# Patient Record
Sex: Female | Born: 1985
Health system: Southern US, Community
[De-identification: ages and names within clinical notes are randomized; demographics above are authoritative.]

## PROBLEM LIST (undated history)

## (undated) ENCOUNTER — Ambulatory Visit (HOSPITAL_COMMUNITY): Discharge status: Absent without leave | Payer: BLUE CROSS/BLUE SHIELD

## (undated) DIAGNOSIS — F32A Depression, unspecified: Secondary | ICD-10-CM

## (undated) DIAGNOSIS — R87629 Unspecified abnormal cytological findings in specimens from vagina: Secondary | ICD-10-CM

## (undated) DIAGNOSIS — F419 Anxiety disorder, unspecified: Secondary | ICD-10-CM

## (undated) DIAGNOSIS — F329 Major depressive disorder, single episode, unspecified: Secondary | ICD-10-CM

## (undated) DIAGNOSIS — N200 Calculus of kidney: Secondary | ICD-10-CM

## (undated) DIAGNOSIS — A609 Anogenital herpesviral infection, unspecified: Secondary | ICD-10-CM

## (undated) DIAGNOSIS — E162 Hypoglycemia, unspecified: Secondary | ICD-10-CM

## (undated) HISTORY — PX: URETHRA SURGERY: SHX824

## (undated) HISTORY — DX: Major depressive disorder, single episode, unspecified: F32.9

## (undated) HISTORY — PX: WISDOM TOOTH EXTRACTION: SHX21

## (undated) HISTORY — DX: Depression, unspecified: F32.A

## (undated) HISTORY — DX: Anogenital herpesviral infection, unspecified: A60.9

---
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
## 2006-04-03 ENCOUNTER — Emergency Department (HOSPITAL_COMMUNITY): Admission: EM | Admit: 2006-04-03 | Discharge: 2006-04-04 | Payer: Self-pay | Admitting: Emergency Medicine

## 2008-02-18 ENCOUNTER — Ambulatory Visit (HOSPITAL_COMMUNITY): Admission: RE | Admit: 2008-02-18 | Discharge: 2008-02-18 | Payer: Self-pay | Admitting: Family Medicine

## 2008-02-22 ENCOUNTER — Encounter (HOSPITAL_COMMUNITY): Admission: RE | Admit: 2008-02-22 | Discharge: 2008-03-23 | Payer: Self-pay | Admitting: Family Medicine

## 2008-03-28 ENCOUNTER — Emergency Department (HOSPITAL_COMMUNITY): Admission: EM | Admit: 2008-03-28 | Discharge: 2008-03-28 | Payer: Self-pay | Admitting: Emergency Medicine

## 2008-08-23 ENCOUNTER — Encounter: Payer: Self-pay | Admitting: Orthopedic Surgery

## 2008-08-23 ENCOUNTER — Emergency Department (HOSPITAL_COMMUNITY): Admission: EM | Admit: 2008-08-23 | Discharge: 2008-08-23 | Payer: Self-pay | Admitting: Emergency Medicine

## 2008-08-30 ENCOUNTER — Ambulatory Visit (HOSPITAL_COMMUNITY): Admission: RE | Admit: 2008-08-30 | Discharge: 2008-08-30 | Payer: Self-pay | Admitting: Orthopedic Surgery

## 2008-08-30 ENCOUNTER — Ambulatory Visit: Payer: Self-pay | Admitting: Orthopedic Surgery

## 2008-08-30 ENCOUNTER — Telehealth: Payer: Self-pay | Admitting: Orthopedic Surgery

## 2008-08-30 ENCOUNTER — Encounter (INDEPENDENT_AMBULATORY_CARE_PROVIDER_SITE_OTHER): Payer: Self-pay | Admitting: *Deleted

## 2008-08-30 DIAGNOSIS — S139XXA Sprain of joints and ligaments of unspecified parts of neck, initial encounter: Secondary | ICD-10-CM | POA: Insufficient documentation

## 2008-09-18 ENCOUNTER — Ambulatory Visit: Payer: Self-pay | Admitting: Orthopedic Surgery

## 2008-09-18 DIAGNOSIS — M5020 Other cervical disc displacement, unspecified cervical region: Secondary | ICD-10-CM | POA: Insufficient documentation

## 2008-09-21 ENCOUNTER — Telehealth: Payer: Self-pay | Admitting: Orthopedic Surgery

## 2008-10-23 ENCOUNTER — Ambulatory Visit: Payer: Self-pay | Admitting: Orthopedic Surgery

## 2008-10-24 ENCOUNTER — Encounter (HOSPITAL_COMMUNITY): Admission: RE | Admit: 2008-10-24 | Discharge: 2008-11-02 | Payer: Self-pay | Admitting: Orthopedic Surgery

## 2008-10-24 ENCOUNTER — Encounter: Payer: Self-pay | Admitting: Orthopedic Surgery

## 2008-10-26 ENCOUNTER — Encounter: Payer: Self-pay | Admitting: Orthopedic Surgery

## 2008-11-03 ENCOUNTER — Encounter (HOSPITAL_COMMUNITY): Admission: RE | Admit: 2008-11-03 | Discharge: 2008-12-03 | Payer: Self-pay | Admitting: Orthopedic Surgery

## 2008-11-10 ENCOUNTER — Emergency Department (HOSPITAL_COMMUNITY): Admission: EM | Admit: 2008-11-10 | Discharge: 2008-11-10 | Payer: Self-pay | Admitting: Emergency Medicine

## 2008-12-18 ENCOUNTER — Ambulatory Visit: Payer: Self-pay | Admitting: Orthopedic Surgery

## 2009-02-07 ENCOUNTER — Telehealth: Payer: Self-pay | Admitting: Orthopedic Surgery

## 2009-03-22 ENCOUNTER — Ambulatory Visit: Payer: Self-pay | Admitting: Orthopedic Surgery

## 2009-03-29 ENCOUNTER — Telehealth: Payer: Self-pay | Admitting: Orthopedic Surgery

## 2009-04-02 ENCOUNTER — Telehealth: Payer: Self-pay | Admitting: Orthopedic Surgery

## 2009-04-19 ENCOUNTER — Encounter: Payer: Self-pay | Admitting: Orthopedic Surgery

## 2009-05-14 ENCOUNTER — Telehealth: Payer: Self-pay | Admitting: Orthopedic Surgery

## 2009-05-17 ENCOUNTER — Telehealth: Payer: Self-pay | Admitting: Orthopedic Surgery

## 2009-05-24 ENCOUNTER — Encounter: Payer: Self-pay | Admitting: Orthopedic Surgery

## 2009-08-01 ENCOUNTER — Encounter: Payer: Self-pay | Admitting: Orthopedic Surgery

## 2009-09-27 ENCOUNTER — Other Ambulatory Visit: Admission: RE | Admit: 2009-09-27 | Discharge: 2009-09-27 | Payer: Self-pay | Admitting: Gynecology

## 2009-09-27 ENCOUNTER — Ambulatory Visit: Payer: Self-pay | Admitting: Gynecology

## 2009-10-31 ENCOUNTER — Emergency Department (HOSPITAL_COMMUNITY): Admission: EM | Admit: 2009-10-31 | Discharge: 2009-10-31 | Payer: Self-pay | Admitting: Emergency Medicine

## 2009-11-10 ENCOUNTER — Emergency Department (HOSPITAL_COMMUNITY): Admission: EM | Admit: 2009-11-10 | Discharge: 2009-11-10 | Payer: Self-pay | Admitting: Emergency Medicine

## 2010-01-23 ENCOUNTER — Inpatient Hospital Stay (HOSPITAL_COMMUNITY)
Admission: AD | Admit: 2010-01-23 | Discharge: 2010-01-23 | Payer: Self-pay | Source: Home / Self Care | Attending: Obstetrics & Gynecology | Admitting: Obstetrics & Gynecology

## 2010-01-31 ENCOUNTER — Emergency Department (HOSPITAL_COMMUNITY)
Admission: EM | Admit: 2010-01-31 | Discharge: 2010-01-31 | Payer: Self-pay | Source: Home / Self Care | Admitting: Emergency Medicine

## 2010-02-23 ENCOUNTER — Encounter: Payer: Self-pay | Admitting: Orthopaedic Surgery

## 2010-03-05 NOTE — Letter (Signed)
Summary: Request for medical records [facility]  Request for medical records [facility]   Imported By: Cammie Sickle 05/17/2009 11:21:48  _____________________________________________________________________  External Attachment:    Type:   Image     Comment:   External Document

## 2010-03-05 NOTE — Progress Notes (Signed)
Summary: wants refills on meds  Phone Note Call from Patient   Summary of Call: Catherine Mitchell ([birth year]) is requesting refills on Robaxin, Ibuprofen and Loratab.  I told her according to the last note she is to get the [facility] before anymore refills.  Said she cannot get the injections until this summer, due to classes.    Asked if she needed to schedule an appointment in order to get the refills.  Uses [facility] in [place] Her # [phone] Initial call taken by: Jacklynn Ganong,  May 17, 2009 10:52 AM  Follow-up for Phone Call        refill ibuprofen and robaxin only  Follow-up by: Fuller Canada MD,  May 17, 2009 10:54 AM    Prescriptions: ROBAXIN 500 MG TABS (METHOCARBAMOL) 1 by mouth q 6 as needed pain  #28 x 5   Entered by:   Ether Griffins   Authorized by:   Fuller Canada MD   Signed by:   Ether Griffins on 05/17/2009   Method used:   Faxed to ...       [facility]  [address] # [phone]* (retail)       [address]       [place], [place]  [address]       Ph: [phone] or [phone]       Fax: [phone]   RxID:   [id number] IBUPROFEN 800 MG TABS (IBUPROFEN) 1 by mouth q 8 as needed pain  #90 x 5   Entered by:   Ether Griffins   Authorized by:   Fuller Canada MD   Signed by:   Ether Griffins on 05/17/2009   Method used:   Faxed to ...       [facility]  [address] # [phone]* (retail)       [address]       [place], [place]  [address]       Ph: [phone] or [phone]       Fax: [phone]   RxID:   [id number]

## 2010-03-05 NOTE — Miscellaneous (Signed)
Summary: PT Discharge summary  PT Discharge summary   Imported By: Jacklynn Ganong 08/02/2009 09:33:24  _____________________________________________________________________  External Attachment:    Type:   Image     Comment:   External Document

## 2010-03-05 NOTE — Progress Notes (Signed)
Summary: requesting refills on pain med, has not been compliant  Phone Note Call from Patient Call back at [facility] Phone [phone] Call back at [phone]   Summary of Call: says she needs refills on her medications her neck is really stiff and has pain, needs Norco 5, Relafen, Ibuprofen and Robaxin she has not went to PT due to the costs. Scheduled to come back next month any suggestions Initial call taken by: Ether Griffins,  February 07, 2009 12:07 PM  Follow-up for Phone Call        okay to refill ibuprofen and Robaxinand ibuprofen no Norco Follow-up by: Fuller Canada MD,  February 07, 2009 12:17 PM  Additional Follow-up for Phone Call Additional follow up Details #1::        advised pt and faxed [facility] [place] Additional Follow-up by: Ether Griffins,  February 07, 2009 1:27 PM    Prescriptions: ROBAXIN 500 MG TABS (METHOCARBAMOL) 1 by mouth q 6 as needed pain  #28 x 2   Entered by:   Ether Griffins   Authorized by:   Fuller Canada MD   Signed by:   Ether Griffins on 02/07/2009   Method used:   Faxed to ...       [facility]  [address] # [phone]* (retail)       [address]       [place], [place]  [address]       Ph: [phone] or [phone]       Fax: [phone]   RxID:   [id number] IBUPROFEN 800 MG TABS (IBUPROFEN) 1 by mouth q 8 as needed pain  #90 x 1   Entered by:   Ether Griffins   Authorized by:   Fuller Canada MD   Signed by:   Ether Griffins on 02/07/2009   Method used:   Faxed to ...       [facility]  [address] # [phone]* (retail)       [address]       [place], [place]  [address]       Ph: [phone] or [phone]       Fax: [phone]   RxID:   [id number]

## 2010-03-05 NOTE — Progress Notes (Signed)
Summary: call from patient about referral  Phone Note Call from Patient   Caller: Patient Summary of Call: O 03/27/09 patient had called about referral for [facility]; states she has re-applied for [facility] discount through financial services.    -advised we would fol/up to check status before proceeding with referral. Initial call taken by: Cammie Sickle,  March 29, 2009 5:47 PM  Follow-up for Phone Call        As of 03/30/09, discount not showing in system Follow-up by: Cammie Sickle,  March 29, 2009 5:47 PM  Additional Follow-up for Phone Call Additional follow up Details #1::        Today, patient and mother each called to relay that patient wishes to wait on ESI until end of spring semester, 06/12/09. Asking for refill on last pain medication; said is out. Additional Follow-up by: Cammie Sickle,  April 02, 2009 10:12 AM

## 2010-03-05 NOTE — Progress Notes (Signed)
Summary: wants vicodin refill  Phone Note Call from Patient Call back at Home Phone [phone]   Summary of Call: wants refill on Vicodin 5 ok or not, is not going for [facility] neck pain Initial call taken by: Ether Griffins,  May 14, 2009 8:47 AM  Follow-up for Phone Call        not Follow-up by: Fuller Canada MD,  May 14, 2009 8:50 AM  Additional Follow-up for Phone Call Additional follow up Details #1::        ok Additional Follow-up by: Ether Griffins,  May 14, 2009 9:01 AM

## 2010-03-05 NOTE — Progress Notes (Signed)
Summary: patient requests PT order fax to [facility]  Phone Note Call from Patient   Caller: Patient Summary of Call: Patient called back with a separate question.  Requests that we fax her physical therapy order from November visit to: [facility]; states she has been in touch with financial assistance dept in [facility].   ?New order needed, dates on order are 11/15 through 06/17/09. Initial call taken by: Cammie Sickle,  May 17, 2009 12:04 PM  Follow-up for Phone Call        ok faxed order to [facility] Follow-up by: Ether Griffins,  May 17, 2009 1:36 PM

## 2010-03-05 NOTE — Miscellaneous (Signed)
Summary: PTreports 10/24/08 and 11/01/08  PTreports 10/24/08 and 11/01/08   Imported By: Cammie Sickle 03/22/2009 12:56:02  _____________________________________________________________________  External Attachment:    Type:   Image     Comment:   External Document
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
## 2010-03-05 NOTE — Progress Notes (Signed)
Summary: Request to hold on [facility] referral + request refills  Phone Note Call from Patient   Caller: Patient Summary of Call: patient called this morning, and also mother got onto phone, to relay that she wishes to hold on the referral for the [facility] until the end of the spring semester 06/12/09.  states she is out of her pain medication and requests refills.  said "had been given 5 refills last time." please advise. Initial call taken by: Cammie Sickle,  April 02, 2009 3:18 PM  Follow-up for Phone Call        Patient called and lft voice message that she is coming in today after class to ask about refill; states "neck is killing her" and that she has "got to have something."  States it was not her idea to have the ESI/injections anyway.  Follow-up by: Cammie Sickle,  April 02, 2009 3:19 PM    Prescriptions: IBUPROFEN 800 MG TABS (IBUPROFEN) 1 by mouth q 8 as needed pain  #90 x 5   Entered and Authorized by:   Fuller Canada MD   Signed by:   Fuller Canada MD on 04/02/2009   Method used:   Print then Give to Patient   RxID:   [id number] ROBAXIN 500 MG TABS (METHOCARBAMOL) 1 by mouth q 6 as needed pain  #28 x 5   Entered and Authorized by:   Fuller Canada MD   Signed by:   Fuller Canada MD on 04/02/2009   Method used:   Print then Give to Patient   RxID:   [id number] LORTAB 5 5-500 MG TABS (HYDROCODONE-ACETAMINOPHEN) 1 q 4 as needed pain  #42 x 5   Entered and Authorized by:   Fuller Canada MD   Signed by:   Fuller Canada MD on 04/02/2009   Method used:   Print then Give to Patient   RxID:   [id number]

## 2010-03-05 NOTE — Miscellaneous (Signed)
Summary: PT evaluation  PT evaluation   Imported By: Jacklynn Ganong 06/04/2009 16:25:34  _____________________________________________________________________  External Attachment:    Type:   Image     Comment:   External Document

## 2010-04-15 LAB — URINALYSIS, ROUTINE W REFLEX MICROSCOPIC
Bilirubin Urine: NEGATIVE
Glucose, UA: NEGATIVE mg/dL
Ketones, ur: NEGATIVE mg/dL
Leukocytes, UA: NEGATIVE
Nitrite: NEGATIVE
Protein, ur: NEGATIVE mg/dL
Specific Gravity, Urine: >1.03 — ABNORMAL HIGH (ref 1.005–1.030)
Urobilinogen, UA: 0.2 mg/dL (ref 0.0–1.0)
pH: 5.5 (ref 5.0–8.0)

## 2010-04-15 LAB — DIFFERENTIAL
Basophils Absolute: 0.1 10*3/uL (ref 0.0–0.1)
Basophils Relative: 1 % (ref 0–1)
Eosinophils Absolute: 0.7 10*3/uL (ref 0.0–0.7)
Eosinophils Relative: 9 % — ABNORMAL HIGH (ref 0–5)
Lymphs Abs: 2.7 10*3/uL (ref 0.7–4.0)
Neutrophils Relative %: 50 % (ref 43–77)

## 2010-04-15 LAB — WET PREP, GENITAL
Trich, Wet Prep: NONE SEEN
Yeast Wet Prep HPF POC: NONE SEEN

## 2010-04-15 LAB — CBC
MCH: 28.7 pg (ref 26.0–34.0)
MCHC: 32.9 g/dL (ref 30.0–36.0)
MCV: 87.5 fL (ref 78.0–100.0)
Platelets: 315 10*3/uL (ref 150–400)
RDW: 13.5 % (ref 11.5–15.5)

## 2010-04-15 LAB — GC/CHLAMYDIA PROBE AMP, GENITAL
Chlamydia, DNA Probe: NEGATIVE
GC Probe Amp, Genital: NEGATIVE

## 2010-04-15 LAB — URINE MICROSCOPIC-ADD ON

## 2010-04-15 LAB — HCG, QUANTITATIVE, PREGNANCY: hCG, Beta Chain, Quant, S: <2 m[IU]/mL (ref <5)

## 2010-04-15 LAB — POCT PREGNANCY, URINE: Preg Test, Ur: NEGATIVE

## 2010-05-06 ENCOUNTER — Emergency Department (HOSPITAL_COMMUNITY): Payer: Self-pay

## 2010-05-06 ENCOUNTER — Emergency Department (HOSPITAL_COMMUNITY)
Admission: EM | Admit: 2010-05-06 | Discharge: 2010-05-06 | Disposition: A | Discharge status: Home / Self Care | Payer: Self-pay | Attending: Emergency Medicine | Admitting: Emergency Medicine

## 2010-05-06 DIAGNOSIS — R51 Headache: Secondary | ICD-10-CM | POA: Insufficient documentation

## 2010-05-06 DIAGNOSIS — M542 Cervicalgia: Secondary | ICD-10-CM | POA: Insufficient documentation

## 2010-05-06 DIAGNOSIS — R071 Chest pain on breathing: Secondary | ICD-10-CM | POA: Insufficient documentation

## 2010-05-06 DIAGNOSIS — R079 Chest pain, unspecified: Secondary | ICD-10-CM | POA: Insufficient documentation

## 2010-05-06 DIAGNOSIS — M25539 Pain in unspecified wrist: Secondary | ICD-10-CM | POA: Insufficient documentation

## 2010-05-06 DIAGNOSIS — R55 Syncope and collapse: Secondary | ICD-10-CM | POA: Insufficient documentation

## 2010-05-06 DIAGNOSIS — R110 Nausea: Secondary | ICD-10-CM | POA: Insufficient documentation

## 2010-05-06 DIAGNOSIS — J320 Chronic maxillary sinusitis: Secondary | ICD-10-CM | POA: Insufficient documentation

## 2010-05-06 DIAGNOSIS — S0510XA Contusion of eyeball and orbital tissues, unspecified eye, initial encounter: Secondary | ICD-10-CM | POA: Insufficient documentation

## 2010-05-06 DIAGNOSIS — S0003XA Contusion of scalp, initial encounter: Secondary | ICD-10-CM | POA: Insufficient documentation

## 2010-05-06 DIAGNOSIS — S0990XA Unspecified injury of head, initial encounter: Secondary | ICD-10-CM | POA: Insufficient documentation

## 2010-05-06 DIAGNOSIS — R220 Localized swelling, mass and lump, head: Secondary | ICD-10-CM | POA: Insufficient documentation

## 2010-10-12 ENCOUNTER — Encounter (HOSPITAL_COMMUNITY): Payer: Self-pay

## 2010-10-12 ENCOUNTER — Emergency Department (HOSPITAL_COMMUNITY)
Admission: EM | Admit: 2010-10-12 | Discharge: 2010-10-12 | Disposition: A | Discharge status: Home / Self Care | Payer: Self-pay | Attending: Emergency Medicine | Admitting: Emergency Medicine

## 2010-10-12 DIAGNOSIS — R1084 Generalized abdominal pain: Secondary | ICD-10-CM | POA: Insufficient documentation

## 2010-10-12 DIAGNOSIS — N760 Acute vaginitis: Secondary | ICD-10-CM | POA: Insufficient documentation

## 2010-10-12 DIAGNOSIS — A499 Bacterial infection, unspecified: Secondary | ICD-10-CM | POA: Insufficient documentation

## 2010-10-12 DIAGNOSIS — R300 Dysuria: Secondary | ICD-10-CM | POA: Insufficient documentation

## 2010-10-12 DIAGNOSIS — B9689 Other specified bacterial agents as the cause of diseases classified elsewhere: Secondary | ICD-10-CM | POA: Insufficient documentation

## 2010-10-12 LAB — URINALYSIS, ROUTINE W REFLEX MICROSCOPIC
Bilirubin Urine: NEGATIVE
Glucose, UA: NEGATIVE mg/dL
Hgb urine dipstick: NEGATIVE
Protein, ur: NEGATIVE mg/dL

## 2010-10-12 LAB — WET PREP, GENITAL: Trich, Wet Prep: NONE SEEN

## 2010-10-12 LAB — PREGNANCY, URINE: Preg Test, Ur: NEGATIVE

## 2010-10-12 MED ORDER — OXYCODONE-ACETAMINOPHEN 5-325 MG PO TABS
1.0000 | ORAL_TABLET | Freq: Once | ORAL | Status: AC
  Administered 2010-10-12: 1 via ORAL
  Filled 2010-10-12: qty 1

## 2010-10-12 MED ORDER — OXYCODONE-ACETAMINOPHEN 5-325 MG PO TABS: 1.0000 | ORAL_TABLET | ORAL | Status: AC | PRN

## 2010-10-12 MED ORDER — ONDANSETRON 8 MG PO TBDP
8.0000 mg | ORAL_TABLET | Freq: Once | ORAL | Status: AC
  Administered 2010-10-12: 8 mg via ORAL
  Filled 2010-10-12: qty 1

## 2010-10-12 MED ORDER — METRONIDAZOLE 500 MG PO TABS: 500.0000 mg | ORAL_TABLET | Freq: Two times a day (BID) | ORAL | Status: AC

## 2010-10-12 MED ORDER — CEPHALEXIN 500 MG PO CAPS: 500.0000 mg | ORAL_CAPSULE | Freq: Four times a day (QID) | ORAL | Status: AC

## 2010-10-12 NOTE — ED Notes (Signed)
Pt reports lower left abd pain that radiates around to her left flank area.  Pt reports being treated to a UTI for the past 3 weeks.  Pt was dx with the UTI at an urgent care.  Pt also reports dysuria, and urinary frequency.  Pt states " i feel like something is sitting on my bladder all the time".

## 2010-10-12 NOTE — ED Provider Notes (Signed)
History     CSN: [id number] Arrival date & time: 10/12/2010  5:00 PM Pt seen at 1755 Chief Complaint  Patient presents with  . Abdominal Pain  . Flank Pain  . Dysuria   Patient is a 25 y.o. female presenting with abdominal pain.  Abdominal Pain The primary symptoms of the illness include dysuria, vaginal discharge and vaginal bleeding. The primary symptoms of the illness do not include fever. Episode onset: 3 months ago. The onset of the illness was gradual. The problem has not changed since onset. The dysuria is associated with frequency.  The vaginal discharge is associated with dysuria.  Additional symptoms associated with the illness include frequency and back pain.  pt reports for 3 months she has dysuria, abd pain and back pain Reports being placed on cipro twice and macrobid once.  Not currently on abx. Reports she had bladder "stretched" but no issues since then  History reviewed. No pertinent past medical history.  History reviewed. No pertinent past surgical history.  No family history on file.  History  Substance Use Topics  . Smoking status: Never Smoker   . Smokeless tobacco: Not on file  . Alcohol Use: Yes    OB History    Grav Para Term Preterm Abortions TAB SAB Ect Mult Living                  Review of Systems  Constitutional: Negative for fever.  Genitourinary: Positive for dysuria, frequency, vaginal bleeding and vaginal discharge.  Musculoskeletal: Positive for back pain.    Physical Exam  BP 128/75  Pulse 84  Temp(Src) 98.2 F (36.8 C) (Oral)  Resp 18  Ht 5\' 8"  (1.727 m)  Wt 215 lb (97.523 kg)  BMI 32.69 kg/m2  SpO2 100%  LMP 09/12/2010  Physical Exam  CONSTITUTIONAL: Well developed/well nourished HEAD AND FACE: Normocephalic/atraumatic EYES: EOMI/PERRL ENMT: Mucous membranes moist NECK: supple no meningeal signs CV: S1/S2 noted, no murmurs/rubs/gallops noted LUNGS: Lungs are clear to auscultation bilaterally, no apparent  distress ABDOMEN: soft, nontender, no rebound or guarding GU:no cva tenderness No cmt, no adnexal tenderness/mass, +vag discharge, no vag bleeding, chaperone present  NEURO: Pt is awake/alert, moves all extremitiesx4 EXTREMITIES: pulses normal, full ROM SKIN: warm, color normal PSYCH: no abnormalities of mood noted   ED Course  Procedures  MDM Nursing notes reviewed and considered in documentation All labs/vitals reviewed and considered  8:32 PM Advised need for f/u with urology as outpatient after another round of outpatient abx Pt agreeable She is well appearing and feels improved     Joya Gaskins, MD 10/12/10 2032

## 2010-10-14 LAB — GC/CHLAMYDIA PROBE AMP, GENITAL: GC Probe Amp, Genital: NEGATIVE

## 2010-10-15 LAB — URINE CULTURE

## 2011-01-10 ENCOUNTER — Encounter (HOSPITAL_COMMUNITY): Payer: Self-pay | Admitting: *Deleted

## 2011-01-10 ENCOUNTER — Emergency Department (HOSPITAL_COMMUNITY)
Admission: EM | Admit: 2011-01-10 | Discharge: 2011-01-10 | Disposition: A | Discharge status: Home / Self Care | Payer: Self-pay | Attending: Emergency Medicine | Admitting: Emergency Medicine

## 2011-01-10 DIAGNOSIS — J111 Influenza due to unidentified influenza virus with other respiratory manifestations: Secondary | ICD-10-CM | POA: Insufficient documentation

## 2011-01-10 HISTORY — DX: Hypoglycemia, unspecified: E16.2

## 2011-01-10 MED ORDER — LIDOCAINE VISCOUS 2 % MT SOLN
20.0000 mL | Freq: Once | OROMUCOSAL | Status: AC
  Administered 2011-01-10: 20 mL via OROMUCOSAL
  Filled 2011-01-10: qty 15

## 2011-01-10 MED ORDER — LIDOCAINE VISCOUS 2 % MT SOLN: 20.0000 mL | OROMUCOSAL | Status: AC | PRN

## 2011-01-10 NOTE — ED Notes (Signed)
Pt c/o sore throat, bilateral ear pain, body aches and fever x 4 days

## 2011-01-11 NOTE — ED Provider Notes (Signed)
History     CSN: [id number] Arrival date & time: 01/10/2011  9:21 AM   First MD Initiated Contact with Patient 01/10/11 [phone]      Chief Complaint  Patient presents with  . Sore Throat  . Otalgia  . Fever  . Generalized Body Aches    (Consider location/radiation/quality/duration/timing/severity/associated sxs/prior treatment) Patient is a 25 y.o. female presenting with pharyngitis, ear pain, and fever. The history is provided by the patient.  Sore Throat This is a new problem. The current episode started in the past 7 days. The problem occurs constantly. The problem has been unchanged. Associated symptoms include chills, a fever, myalgias and a sore throat. Pertinent negatives include no abdominal pain, arthralgias, chest pain, congestion, coughing, headaches, joint swelling, nausea, neck pain, numbness, rash or weakness. Associated symptoms comments: Bilateral ear pain.   Fever has been subjective.. The symptoms are aggravated by swallowing. She has tried NSAIDs and acetaminophen for the symptoms. The treatment provided no relief.  Otalgia This is a new problem. The current episode started more than 2 days ago. There is pain in both ears. The problem occurs constantly. The problem has not changed since onset.The pain is at a severity of 8/10. The pain is moderate. Associated symptoms include sore throat. Pertinent negatives include no ear discharge, no headaches, no hearing loss, no rhinorrhea, no abdominal pain, no neck pain, no cough and no rash. Associated symptoms comments: No nasal congestion.. Her past medical history does not include chronic ear infection.  Fever Primary symptoms of the febrile illness include fever and myalgias. Primary symptoms do not include headaches, cough, shortness of breath, abdominal pain, nausea, arthralgias or rash.  The myalgias are not associated with weakness.    Past Medical History  Diagnosis Date  . Hypoglycemia     Past Surgical History    Procedure Date  . Urethra surgery had it stretched as a child    History reviewed. No pertinent family history.  History  Substance Use Topics  . Smoking status: Never Smoker   . Smokeless tobacco: Not on file  . Alcohol Use: Yes    OB History    Grav Para Term Preterm Abortions TAB SAB Ect Mult Living                  Review of Systems  Constitutional: Positive for fever and chills.  HENT: Positive for ear pain and sore throat. Negative for hearing loss, congestion, rhinorrhea, neck pain and ear discharge.   Eyes: Negative.   Respiratory: Negative for cough, chest tightness and shortness of breath.   Cardiovascular: Negative for chest pain.  Gastrointestinal: Negative for nausea and abdominal pain.  Genitourinary: Negative.   Musculoskeletal: Positive for myalgias. Negative for joint swelling and arthralgias.  Skin: Negative.  Negative for rash and wound.  Neurological: Negative for dizziness, weakness, light-headedness, numbness and headaches.  Hematological: Negative.   Psychiatric/Behavioral: Negative.     Allergies  Latex; Sulfur; and Tomato  Home Medications   Current Outpatient Rx  Name Route Sig Dispense Refill  . ALPRAZOLAM 1 MG PO TABS Oral Take 1 mg by mouth 2 (two) times daily.     Marland Kitchen ZYRTEC ALLERGY PO Oral Take 1 tablet by mouth daily.     . IBUPROFEN 200 MG PO TABS Oral Take 400 mg by mouth every 8 (eight) hours as needed. ACHES AND PAIN     . LIDOCAINE VISCOUS 2 % MT SOLN Oral Take 20 mLs by mouth as needed for pain.  Gargle and spit solution for pain control 100 mL 0    BP 109/61  Pulse 60  Temp(Src) 98.3 F (36.8 C) (Oral)  Resp 18  Ht 5\' 8"  (1.727 m)  Wt 215 lb (97.523 kg)  BMI 32.69 kg/m2  SpO2 96%  LMP 12/11/2010  Physical Exam  Nursing note and vitals reviewed. Constitutional: She is oriented to person, place, and time. She appears well-developed and well-nourished.  HENT:  Head: Normocephalic and atraumatic.  Right Ear: Hearing,  tympanic membrane, external ear and ear canal normal.  Left Ear: Hearing, tympanic membrane, external ear and ear canal normal.  Nose: No mucosal edema or rhinorrhea.  Mouth/Throat: Uvula is midline and mucous membranes are normal. Posterior oropharyngeal erythema present. No oropharyngeal exudate, posterior oropharyngeal edema or tonsillar abscesses.  Eyes: Conjunctivae are normal.  Neck: Normal range of motion.  Cardiovascular: Normal rate, regular rhythm, normal heart sounds and intact distal pulses.   Pulmonary/Chest: Effort normal and breath sounds normal. She has no wheezes.  Abdominal: Soft. Bowel sounds are normal. There is no tenderness.  Musculoskeletal: Normal range of motion.  Neurological: She is alert and oriented to person, place, and time.  Skin: Skin is warm and dry.  Psychiatric: She has a normal mood and affect.    ED Course  Procedures (including critical care time)   Labs Reviewed  RAPID STREP SCREEN  LAB REPORT - SCANNED   No results found.   1. Influenza       MDM  Rest,  Fluids,  Tylenol or motrin,  Lidocaine gargles for throat pain relief.        Candis Musa, PA 01/11/11 2201

## 2011-01-13 NOTE — ED Provider Notes (Signed)
Medical screening examination/treatment/procedure(s) were performed by non-physician practitioner and as supervising physician I was immediately available for consultation/collaboration.  Donnetta Hutching, MD 01/13/11 [phone]

## 2011-05-01 ENCOUNTER — Encounter (HOSPITAL_COMMUNITY): Payer: Self-pay | Admitting: *Deleted

## 2011-05-01 ENCOUNTER — Emergency Department (HOSPITAL_COMMUNITY)
Admission: EM | Admit: 2011-05-01 | Discharge: 2011-05-01 | Disposition: A | Discharge status: Home / Self Care | Payer: Self-pay | Attending: Emergency Medicine | Admitting: Emergency Medicine

## 2011-05-01 ENCOUNTER — Emergency Department (HOSPITAL_COMMUNITY): Payer: Self-pay

## 2011-05-01 DIAGNOSIS — R6883 Chills (without fever): Secondary | ICD-10-CM | POA: Insufficient documentation

## 2011-05-01 DIAGNOSIS — R079 Chest pain, unspecified: Secondary | ICD-10-CM | POA: Insufficient documentation

## 2011-05-01 DIAGNOSIS — R0789 Other chest pain: Secondary | ICD-10-CM

## 2011-05-01 DIAGNOSIS — J40 Bronchitis, not specified as acute or chronic: Secondary | ICD-10-CM | POA: Insufficient documentation

## 2011-05-01 DIAGNOSIS — R071 Chest pain on breathing: Secondary | ICD-10-CM | POA: Insufficient documentation

## 2011-05-01 MED ORDER — HYDROCODONE-ACETAMINOPHEN 5-325 MG PO TABS: 1.0000 | ORAL_TABLET | ORAL | Status: AC | PRN

## 2011-05-01 MED ORDER — AZITHROMYCIN 250 MG PO TABS
500.0000 mg | ORAL_TABLET | Freq: Once | ORAL | Status: AC
  Administered 2011-05-01: 500 mg via ORAL
  Filled 2011-05-01: qty 2

## 2011-05-01 MED ORDER — ALBUTEROL SULFATE HFA 108 (90 BASE) MCG/ACT IN AERS
2.0000 | INHALATION_SPRAY | RESPIRATORY_TRACT | Status: DC
  Administered 2011-05-01: 2 via RESPIRATORY_TRACT
  Filled 2011-05-01: qty 6.7

## 2011-05-01 MED ORDER — HYDROCODONE-ACETAMINOPHEN 5-325 MG PO TABS
1.0000 | ORAL_TABLET | Freq: Once | ORAL | Status: AC
  Administered 2011-05-01: 1 via ORAL
  Filled 2011-05-01: qty 1

## 2011-05-01 MED ORDER — AZITHROMYCIN 250 MG PO TABS: 250.0000 mg | ORAL_TABLET | Freq: Every day | ORAL | Status: AC

## 2011-05-01 NOTE — Discharge Instructions (Signed)
Bronchitis Bronchitis is a problem of the air tubes leading to your lungs. This problem makes it hard for air to get in and out of the lungs. You may cough a lot because your air tubes are narrow. Going without care can cause lasting (chronic) bronchitis. HOME CARE   Drink enough fluids to keep your pee (urine) clear or pale yellow.   Use a cool mist humidifier.   Quit smoking if you smoke. If you keep smoking, the bronchitis might not get better.   Only take medicine as told by your doctor.  GET HELP RIGHT AWAY IF:   Coughing keeps you awake.   You start to wheeze.   You become more sick or weak.   You have a hard time breathing or get short of breath.   You cough up blood.   Coughing lasts more than 2 weeks.   You have a fever.   Your baby is older than 3 months with a rectal temperature of 102 F (38.9 C) or higher.   Your baby is 67 months old or younger with a rectal temperature of 100.4 F (38 C) or higher.  MAKE SURE YOU:  Understand these instructions.   Will watch your condition.   Will get help right away if you are not doing well or get worse.  Document Released: 07/09/2007 Document Revised: 01/09/2011 Document Reviewed: 12/22/2008 [facility] Patient Information 2012 [place], [place].Chest Wall Pain Chest wall pain is pain in or around the bones and muscles of your chest. It may take up to 6 weeks to get better. It may take longer if you must stay physically active in your work and activities.  CAUSES  Chest wall pain may happen on its own. However, it may be caused by:  A viral illness like the flu.   Injury.   Coughing.   Exercise.   Arthritis.   Fibromyalgia.   Shingles.  HOME CARE INSTRUCTIONS   Avoid overtiring physical activity. Try not to strain or perform activities that cause pain. This includes any activities using your chest or your abdominal and side muscles, especially if heavy weights are used.   Put ice on the sore area.   Put ice  in a plastic bag.   Place a towel between your skin and the bag.   Leave the ice on for 15 to 20 minutes per hour while awake for the first 2 days.   Only take over-the-counter or prescription medicines for pain, discomfort, or fever as directed by your caregiver.  SEEK IMMEDIATE MEDICAL CARE IF:   Your pain increases, or you are very uncomfortable.   You have a fever.   Your chest pain becomes worse.   You have new, unexplained symptoms.   You have nausea or vomiting.   You feel sweaty or lightheaded.   You have a cough with phlegm (sputum), or you cough up blood.  MAKE SURE YOU:   Understand these instructions.   Will watch your condition.   Will get help right away if you are not doing well or get worse.  Document Released: 01/20/2005 Document Revised: 01/09/2011 Document Reviewed: 09/16/2010 [facility] Patient Information 2012 [place], [place].   You may use the albuterol and albuterol inhaler given 2 puffs every 4 hours if needed for  coughing or wheezing. Take your next dose of Zithromax tomorrow. You may use the Vicodin which will help you with pain but also will help suppress your coughing.  I also suggest you continue taking ibuprofen as  you are doing, 800 mg 3 times daily for the next week which should help heal your chest wall pain.  Rest, make sure you're drinking plenty of fluids.  Get rechecked by your Dr. if this is not improving over the next 4-5 days.

## 2011-05-01 NOTE — ED Notes (Signed)
Pt DC to home with steady gait 

## 2011-05-01 NOTE — ED Notes (Signed)
Sick for 3 weeks, cough ,congestion,  Pain rt lt ribs since last night,  Green sputum. No fever.

## 2011-05-02 NOTE — ED Provider Notes (Signed)
History     CSN: [id number]  Arrival date & time 05/01/11  1143   First MD Initiated Contact with Patient 05/01/11 1236      Chief Complaint  Patient presents with  . Pleurisy    (Consider location/radiation/quality/duration/timing/severity/associated sxs/prior treatment) Patient is a 26 y.o. female presenting with URI. The history is provided by the patient.  URI The primary symptoms include cough. Primary symptoms do not include fever, headaches, sore throat, wheezing, abdominal pain, nausea, arthralgias or rash. Episode onset: 3 weeks ago. The problem has been gradually worsening (since yesterday has developed purulent sputum and pain in bilateral anterior ribs with coughing).  The cough began more than 1 week ago. The cough is productive. The sputum is green.  Symptoms associated with the illness include chills, congestion and rhinorrhea. The illness is not associated with plugged ear sensation, facial pain or sinus pressure. The following treatments were addressed: Acetaminophen was not tried. A decongestant was not tried. Aspirin was not tried. NSAIDs were effective.    Past Medical History  Diagnosis Date  . Hypoglycemia     Past Surgical History  Procedure Date  . Urethra surgery had it stretched as a child    History reviewed. No pertinent family history.  History  Substance Use Topics  . Smoking status: Never Smoker   . Smokeless tobacco: Not on file  . Alcohol Use: Yes    OB History    Grav Para Term Preterm Abortions TAB SAB Ect Mult Living                  Review of Systems  Constitutional: Positive for chills. Negative for fever.  HENT: Positive for congestion and rhinorrhea. Negative for sore throat, neck pain and sinus pressure.   Eyes: Negative.   Respiratory: Positive for cough. Negative for chest tightness, shortness of breath and wheezing.   Cardiovascular: Positive for chest pain. Negative for palpitations and leg swelling.  Gastrointestinal:  Negative for nausea and abdominal pain.  Genitourinary: Negative.   Musculoskeletal: Negative for joint swelling and arthralgias.  Skin: Negative.  Negative for rash and wound.  Neurological: Negative for dizziness, weakness, light-headedness, numbness and headaches.  Hematological: Negative.   Psychiatric/Behavioral: Negative.     Allergies  Latex; Sulfur; and Tomato  Home Medications   Current Outpatient Rx  Name Route Sig Dispense Refill  . ALPRAZOLAM 1 MG PO TABS Oral Take 1 mg by mouth 2 (two) times daily.     . AZITHROMYCIN 250 MG PO TABS Oral Take 1 tablet (250 mg total) by mouth daily. 4 tablet 0  . ZYRTEC ALLERGY PO Oral Take 1 tablet by mouth daily.     Marland Kitchen HYDROCODONE-ACETAMINOPHEN 5-325 MG PO TABS Oral Take 1 tablet by mouth every 4 (four) hours as needed for pain. 15 tablet 0  . IBUPROFEN 200 MG PO TABS Oral Take 400 mg by mouth every 8 (eight) hours as needed. ACHES AND PAIN       BP 119/67  Pulse 73  Temp(Src) 97.8 F (36.6 C) (Oral)  Resp 20  Ht 5\' 8"  (1.727 m)  Wt 215 lb (97.523 kg)  BMI 32.69 kg/m2  SpO2 100%  LMP 04/27/2011  Physical Exam  Nursing note and vitals reviewed. Constitutional: She is oriented to person, place, and time. She appears well-developed and well-nourished.  HENT:  Head: Normocephalic and atraumatic.  Eyes: Conjunctivae are normal.  Neck: Normal range of motion.  Cardiovascular: Normal rate, regular rhythm, normal heart sounds and intact  distal pulses.   Pulmonary/Chest: Effort normal and breath sounds normal. No respiratory distress. She has no wheezes. She has no rhonchi. She has no rales. She exhibits tenderness. She exhibits no crepitus, no deformity, no swelling and no retraction.         Frequent dry cough  Abdominal: Soft. Bowel sounds are normal. There is no tenderness.  Musculoskeletal: Normal range of motion.  Neurological: She is alert and oriented to person, place, and time.  Skin: Skin is warm and dry.  Psychiatric:  She has a normal mood and affect.    ED Course  Procedures (including critical care time)  Labs Reviewed - No data to display Dg Chest 2 View  05/01/2011  *RADIOLOGY REPORT*  Clinical Data: Pleurisy.  CHEST - 2 VIEW  Comparison: 05/06/2010  Findings: Heart and mediastinal contours are within normal limits. No focal opacities or effusions.  No acute bony abnormality.  IMPRESSION: Normal study.  Original Report Authenticated By: Cyndie Chime, M.D.     1. Bronchitis   2. Chest wall pain       MDM  Zithromax prescribed given chronicity of infection,  Hydrocodone for cough relief and chest wall pain tx.  Patient perc negative for PE,  Normal vital signs,  Chest wall pain reproducible.   Chest xray reviewed.         Candis Musa, PA 05/02/11 2158

## 2011-05-07 NOTE — ED Provider Notes (Signed)
Medical screening examination/treatment/procedure(s) were performed by non-physician practitioner and as supervising physician I was immediately available for consultation/collaboration.   Rashonda Warrior W Keitra Carusone, MD 05/07/11 1426 

## 2011-09-12 ENCOUNTER — Emergency Department (HOSPITAL_COMMUNITY)
Admission: EM | Admit: 2011-09-12 | Discharge: 2011-09-13 | Discharge status: Against Medical Advice | Payer: Self-pay | Attending: Emergency Medicine | Admitting: Emergency Medicine

## 2011-09-12 ENCOUNTER — Encounter (HOSPITAL_COMMUNITY): Payer: Self-pay | Admitting: *Deleted

## 2011-09-12 DIAGNOSIS — IMO0002 Reserved for concepts with insufficient information to code with codable children: Secondary | ICD-10-CM | POA: Insufficient documentation
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
## 2011-09-13 ENCOUNTER — Emergency Department (HOSPITAL_COMMUNITY): Payer: Self-pay
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
## 2011-09-13 NOTE — ED Notes (Addendum)
Patient is refusing x-rays and any further investigation by officer. States "I have to work at 6 in the morning and I just want to go home." Father at bedside. Advised Dr Manus Gunning.

## 2012-03-03 ENCOUNTER — Encounter (HOSPITAL_COMMUNITY): Payer: Self-pay | Admitting: Emergency Medicine

## 2012-03-03 ENCOUNTER — Emergency Department (HOSPITAL_COMMUNITY)
Admission: EM | Admit: 2012-03-03 | Discharge: 2012-03-03 | Disposition: A | Discharge status: Home / Self Care | Payer: Self-pay | Attending: Emergency Medicine | Admitting: Emergency Medicine

## 2012-03-03 ENCOUNTER — Emergency Department (HOSPITAL_COMMUNITY): Payer: Self-pay

## 2012-03-03 DIAGNOSIS — Y9301 Activity, walking, marching and hiking: Secondary | ICD-10-CM | POA: Insufficient documentation

## 2012-03-03 DIAGNOSIS — Z862 Personal history of diseases of the blood and blood-forming organs and certain disorders involving the immune mechanism: Secondary | ICD-10-CM | POA: Insufficient documentation

## 2012-03-03 DIAGNOSIS — Z8639 Personal history of other endocrine, nutritional and metabolic disease: Secondary | ICD-10-CM | POA: Insufficient documentation

## 2012-03-03 DIAGNOSIS — W010XXA Fall on same level from slipping, tripping and stumbling without subsequent striking against object, initial encounter: Secondary | ICD-10-CM | POA: Insufficient documentation

## 2012-03-03 DIAGNOSIS — Y92009 Unspecified place in unspecified non-institutional (private) residence as the place of occurrence of the external cause: Secondary | ICD-10-CM | POA: Insufficient documentation

## 2012-03-03 DIAGNOSIS — S40019A Contusion of unspecified shoulder, initial encounter: Secondary | ICD-10-CM | POA: Insufficient documentation

## 2012-03-03 MED ORDER — HYDROCODONE-ACETAMINOPHEN 5-325 MG PO TABS
1.0000 | ORAL_TABLET | Freq: Once | ORAL | Status: AC
  Administered 2012-03-03: 1 via ORAL
  Filled 2012-03-03: qty 1

## 2012-03-03 MED ORDER — HYDROCODONE-ACETAMINOPHEN 5-325 MG PO TABS: 1.0000 | ORAL_TABLET | ORAL | Status: DC | PRN

## 2012-03-03 MED ORDER — IBUPROFEN 600 MG PO TABS: 600.0000 mg | ORAL_TABLET | Freq: Four times a day (QID) | ORAL | Status: DC | PRN

## 2012-03-03 MED ORDER — IBUPROFEN 400 MG PO TABS
600.0000 mg | ORAL_TABLET | Freq: Once | ORAL | Status: AC
  Administered 2012-03-03: 600 mg via ORAL
  Filled 2012-03-03: qty 2

## 2012-03-03 NOTE — ED Notes (Signed)
Pt slipped and fell this am. C/o L shoulder pain radiating into elbow. Nad. rom slightly limited due to pain. No obvious dislocation noted.

## 2012-03-03 NOTE — ED Notes (Signed)
Patient with no complaints at this time. Respirations even and unlabored. Skin warm/dry. Discharge instructions reviewed with patient at this time. Patient given opportunity to voice concerns/ask questions. Patient discharged at this time and left Emergency Department with steady gait.   

## 2012-03-03 NOTE — ED Provider Notes (Signed)
History  This chart was scribed for [name], MD by Bennett Scrape, ED Scribe. This patient was seen in room [facility]16A/[facility]A and the patient's care was started at 12:48 PM.  CSN: [id number]  Arrival date & time 03/03/12  1223   First MD Initiated Contact with Patient 03/03/12 1248      Chief Complaint  Patient presents with  . Fall    Patient is a 27 y.o. female presenting with fall. The history is provided by the patient. No language interpreter was used.  Fall The accident occurred less than 1 hour ago. The fall occurred while walking. She fell from an unknown height. She landed on grass. There was no blood loss. The point of impact was the left shoulder. The pain is present in the left shoulder. The pain is moderate. She was ambulatory at the scene. There was no entrapment after the fall. There was no drug use involved in the accident. There was no alcohol use involved in the accident. Pertinent negatives include no abdominal pain, no nausea, no vomiting, no headaches and no loss of consciousness. The symptoms are aggravated by use of the injured limb.    Catherine Mitchell is a 27 y.o. female who presents to the Emergency Department complaining of a slip and fall on ice approximately one hour PTA. Pt states that she landed on her left shoulder on the sidewalk of her home. She c/o left shoulder pain and reports decreased ROM secondary to pain. She denies head trauma or LOC. She denies taking OTC medications at home to improve symptoms and denies prior injuries to the left shoulder. She denies back pain, abdominal pain and neck pain as associated symptoms. She has a h/o hypoglycemia and is an occasional alcohol user but denies smoking.  Past Medical History  Diagnosis Date  . Hypoglycemia     Past Surgical History  Procedure Date  . Urethra surgery had it stretched as a child    History reviewed. No pertinent family history.  History  Substance Use Topics  . Smoking status:  Never Smoker   . Smokeless tobacco: Not on file  . Alcohol Use: Yes     Comment: occasional    No OB history provided.  Review of Systems  HENT: Negative for neck pain.   Gastrointestinal: Negative for nausea, vomiting and abdominal pain.  Musculoskeletal: Positive for arthralgias (Positive for left shoulder pain). Negative for back pain.  Neurological: Negative for loss of consciousness and headaches.  All other systems reviewed and are negative.    Allergies  Latex; Sulfur; and Tomato  Home Medications   Current Outpatient Rx  Name  Route  Sig  Dispense  Refill  . ETONOGESTREL 68 MG [place] IMPL   Subcutaneous   Inject 1 each into the skin once.         . IBUPROFEN 200 MG PO TABS   Oral   Take 400 mg by mouth every 8 (eight) hours as needed. ACHES AND PAIN          . HYDROCODONE-ACETAMINOPHEN 5-325 MG PO TABS   Oral   Take 1 tablet by mouth every 4 (four) hours as needed for pain.   10 tablet   0   . IBUPROFEN 600 MG PO TABS   Oral   Take 1 tablet (600 mg total) by mouth every 6 (six) hours as needed for pain.   30 tablet   0     Triage Vitals: BP 129/78  Pulse 78  Temp 97.7 F (36.5 C) (Oral)  Resp 20  Ht 5\' 8"  (1.727 m)  Wt 220 lb (99.791 kg)  BMI 33.45 kg/m2  SpO2 100%  Physical Exam  Nursing note and vitals reviewed. Constitutional: She is oriented to person, place, and time. She appears well-developed and well-nourished. No distress.  HENT:  Head: Normocephalic and atraumatic.       No obvious sign of trauma  Eyes: EOM are normal.  Neck: Neck supple. No tracheal deviation present.       No c-spine tenderness  Cardiovascular: Normal rate.   Pulmonary/Chest: Effort normal. No respiratory distress.  Abdominal: Soft. There is no tenderness.  Musculoskeletal: Normal range of motion. She exhibits tenderness.       Tenderness over the left scapula spine, no proximal humerus tenderness  Neurological: She is alert and oriented to person, place, and  time.  Skin: Skin is warm and dry.  Psychiatric: She has a normal mood and affect. Her behavior is normal.    ED Course  Procedures (including critical care time)  DIAGNOSTIC STUDIES: Oxygen Saturation is 100% on room air, normal by my interpretation.    COORDINATION OF CARE: 1:12 PM-Discussed treatment plan which includes XR of left shoulder with pt at bedside and pt agreed to plan.    Labs Reviewed - No data to display Dg Shoulder Left  03/03/2012  *RADIOLOGY REPORT*  Clinical Data: Fall today left shoulder and humerus pain.  LEFT SHOULDER - 2+ VIEW  Comparison: None.  Findings: Calcific tendinosis is evident at the insertion of the rotator cuff tendon.  This may be post-traumatic.  No acute fracture is present.  The shoulder is located. The visualized hemithorax is clear.  IMPRESSION:  1.  Calcifications along the distal rotator cuff tendon are as compatible with calcific tendinosis, potentially from prior injury. 2.  No acute abnormality.   Original Report Authenticated By: Marin Roberts, M.D.      1. Shoulder contusion       MDM  I personally performed the services described in this documentation, which was scribed in my presence. The recorded information has been reviewed and is accurate.        [name], MD 03/04/12 [phone]

## 2012-03-28 IMAGING — CR DG CHEST 2V
2 series · 2 of 2 positions shown · non-contrast
Comparison: 05/06/2010

CLINICAL DATA: Pleurisy.

CHEST - 2 VIEW

[view not recorded (1 of 2)]
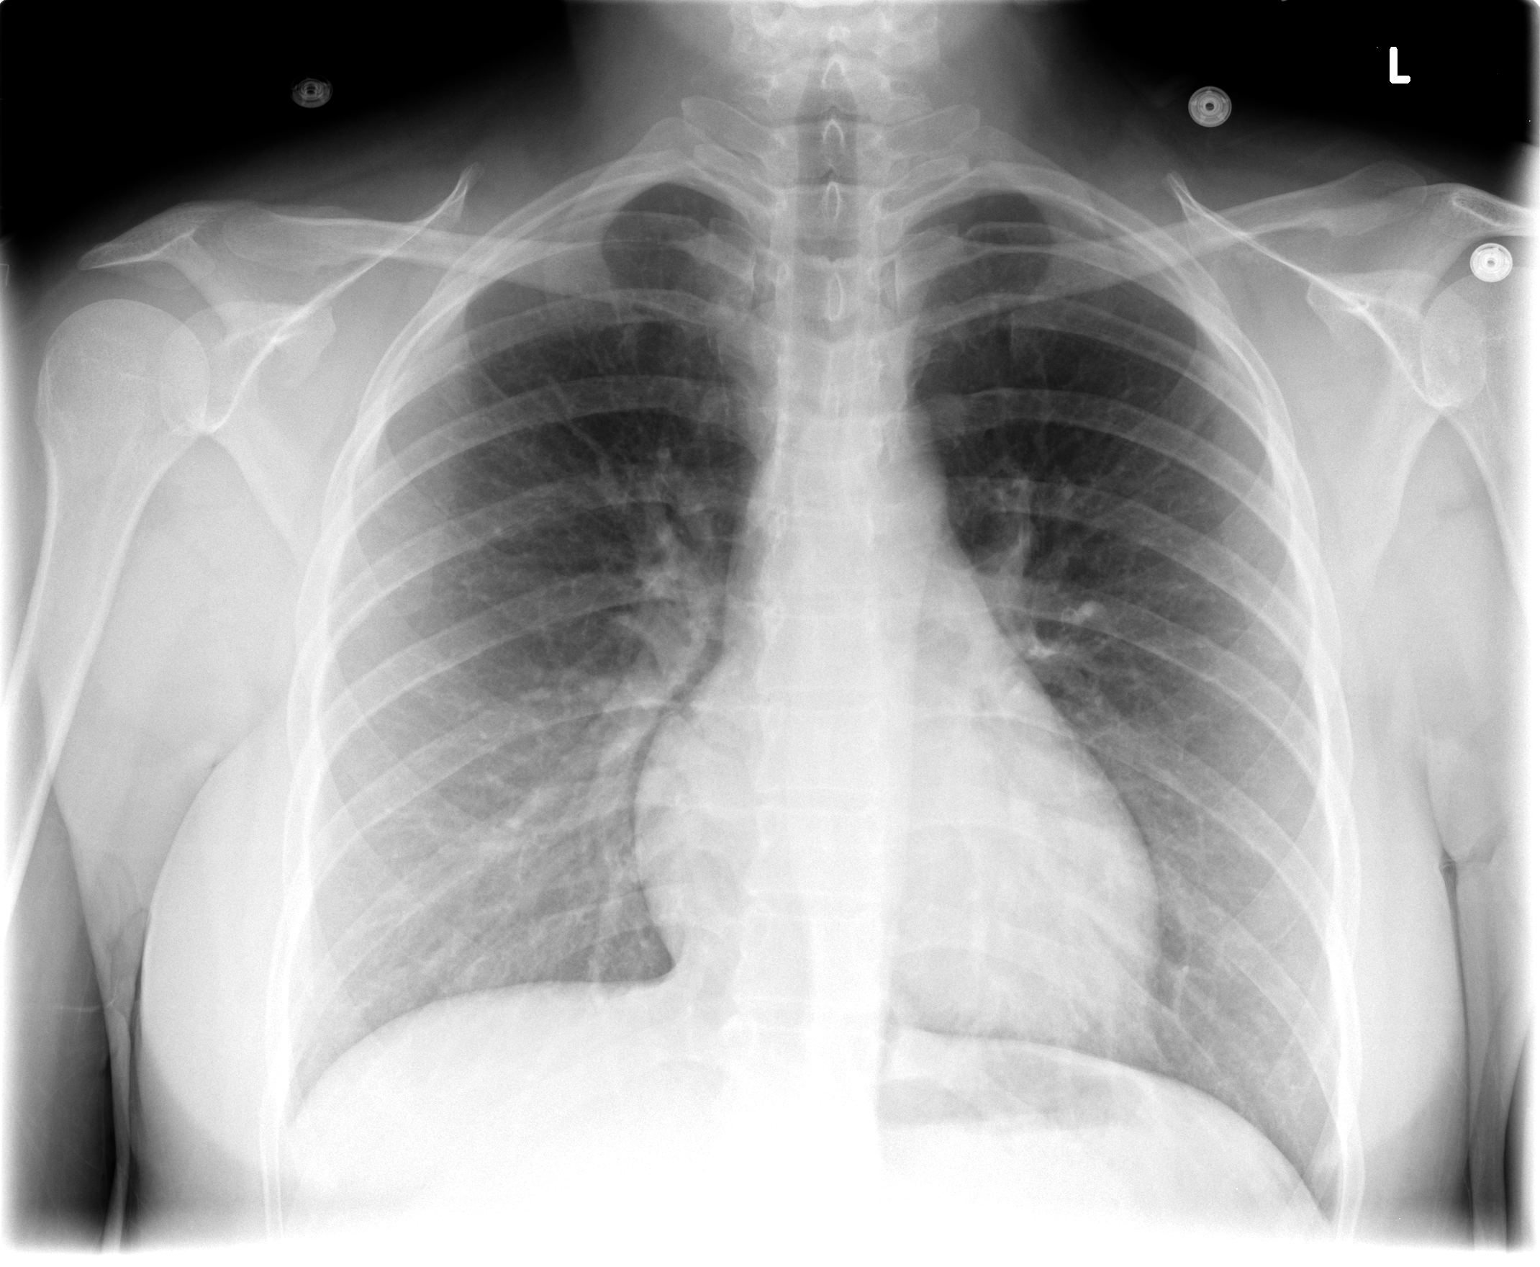

[view not recorded (2 of 2)]
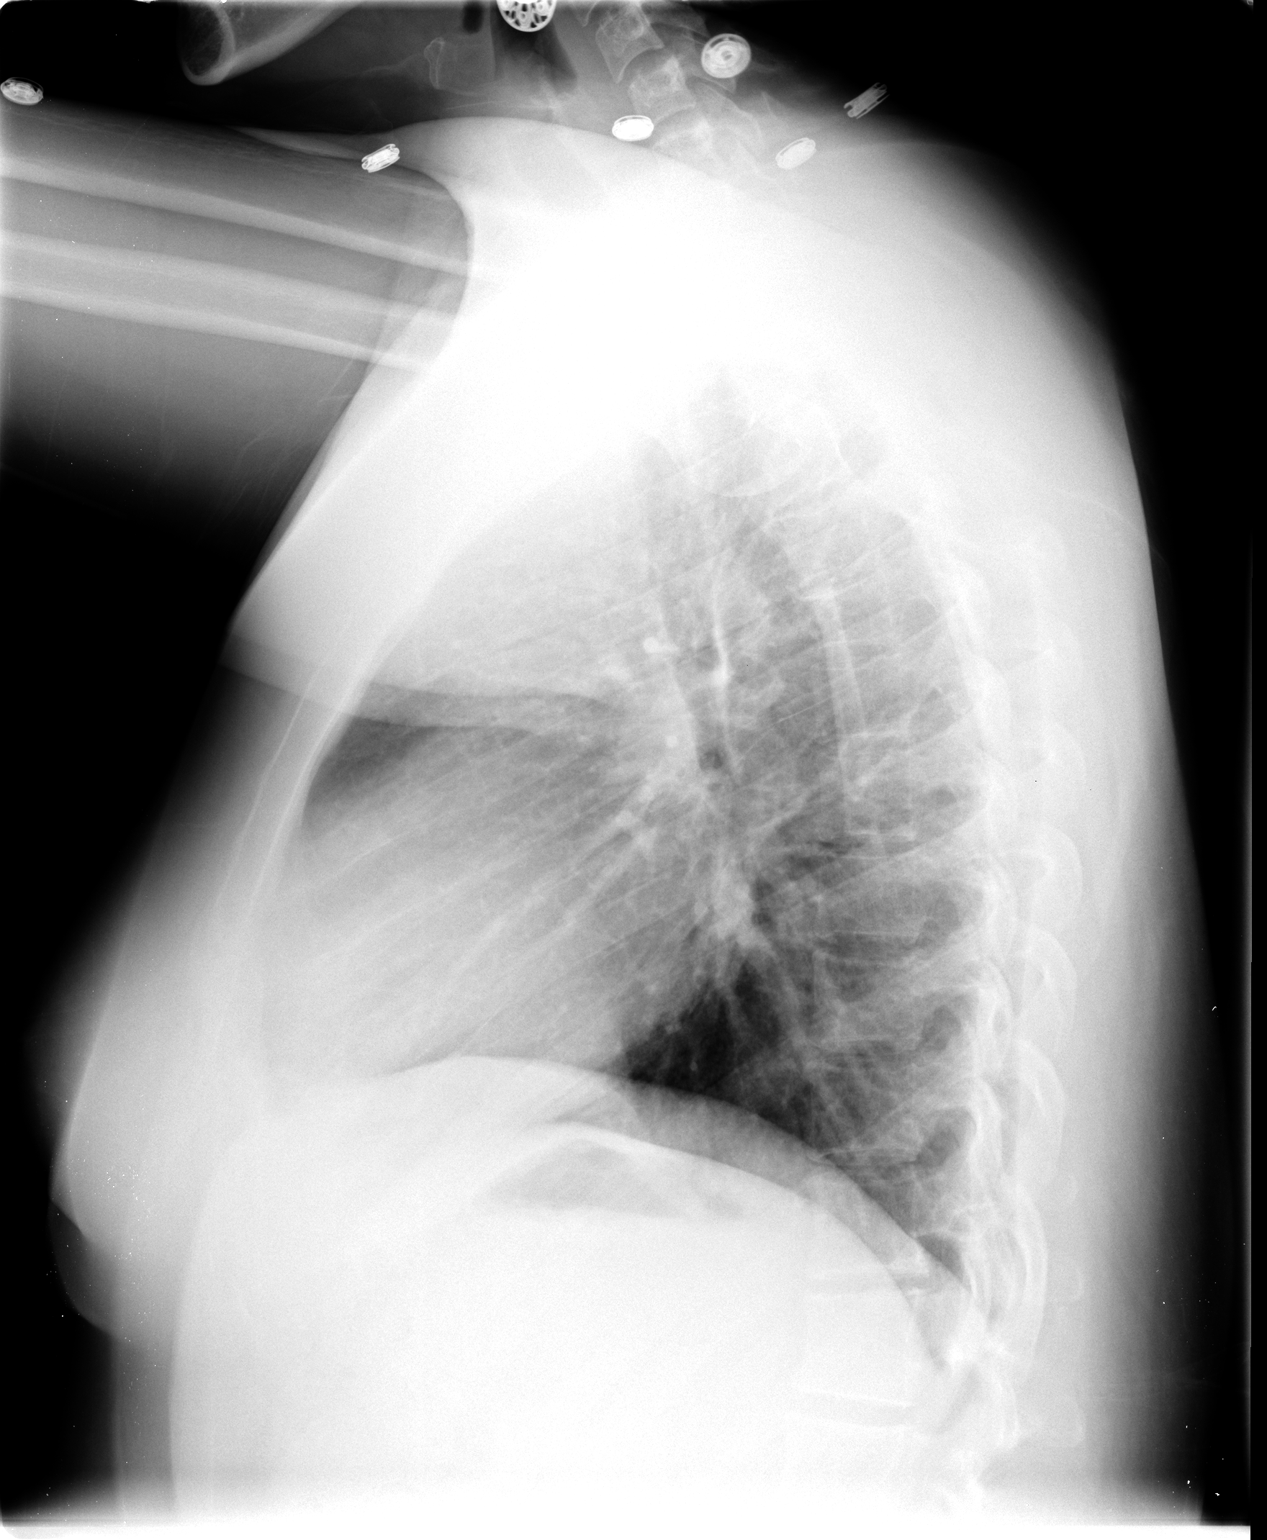

[2 of 2 positions shown; findings below may reference images not displayed]

FINDINGS: Heart and mediastinal contours are within normal limits.
No focal opacities or effusions.  No acute bony abnormality.
IMPRESSION: Normal study.

## 2013-02-03 HISTORY — PX: OTHER SURGICAL HISTORY: SHX169

## 2013-02-15 ENCOUNTER — Encounter: Payer: Self-pay | Admitting: Women's Health

## 2013-02-15 ENCOUNTER — Ambulatory Visit (INDEPENDENT_AMBULATORY_CARE_PROVIDER_SITE_OTHER): Payer: BC Managed Care – PPO | Admitting: Women's Health

## 2013-02-15 DIAGNOSIS — R300 Dysuria: Secondary | ICD-10-CM

## 2013-02-15 DIAGNOSIS — L293 Anogenital pruritus, unspecified: Secondary | ICD-10-CM

## 2013-02-15 DIAGNOSIS — N898 Other specified noninflammatory disorders of vagina: Secondary | ICD-10-CM

## 2013-02-15 DIAGNOSIS — N390 Urinary tract infection, site not specified: Secondary | ICD-10-CM

## 2013-02-15 LAB — URINALYSIS W MICROSCOPIC + REFLEX CULTURE
BILIRUBIN URINE: NEGATIVE
CASTS: NONE SEEN
CRYSTALS: NONE SEEN
Glucose, UA: NEGATIVE mg/dL
KETONES UR: NEGATIVE mg/dL
Nitrite: NEGATIVE
PH: 5.5 (ref 5.0–8.0)
Protein, ur: NEGATIVE mg/dL
SPECIFIC GRAVITY, URINE: 1.025 (ref 1.005–1.030)
Urobilinogen, UA: 0.2 mg/dL (ref 0.0–1.0)

## 2013-02-15 LAB — WET PREP FOR TRICH, YEAST, CLUE
Clue Cells Wet Prep HPF POC: NONE SEEN
Trich, Wet Prep: NONE SEEN

## 2013-02-15 MED ORDER — FLUCONAZOLE 150 MG PO TABS: 150.0000 mg | ORAL_TABLET | Freq: Once | ORAL | Status: DC

## 2013-02-15 MED ORDER — NITROFURANTOIN MONOHYD MACRO 100 MG PO CAPS: 100.0000 mg | ORAL_CAPSULE | Freq: Two times a day (BID) | ORAL | Status: AC

## 2013-02-15 NOTE — Progress Notes (Signed)
Patient ID: Catherine Mitchell, female   DOB: 05/26/1985, 28 y.o.   MRN: [id number] Presents with dysuria, increase frequency, vaginal pruitis and discharge for past week. Frequency for years with, nocturia x2.  Urethra stretching surgery as young child.  Tried Monistat, little relief.  Same partner past 2 years. [facility]/ irregular cycles. Reports cramps, heavy bleeding,  some cycles last up to 3 weeks.   Exam: Appears well. External genitalia erythematous from mons pubis the perineum. Speculum exam moderate amount of a white curdy discharge noted, wet prep positive for yeast. Bimanual no CMT or adnexal fullness or tenderness. UA: 21-50 WBCs, 36 RBCs, few bacteria  Yeast vaginitis Irregular cycle on Implanon UTI   Plan: Macrobid twice daily for 7 days #14, instructed to take with food. Urine culture pending. Diflucan 150 by mouth today, refill given. Instructed to call if no relief of symptoms. Keep scheduled annual exam office visit to discuss contraception. Schedule followup with urologist if unable to get scheduled will discuss an annual exam. Discontinue Monistat use.

## 2013-02-15 NOTE — Patient Instructions (Signed)
Monilial Vaginitis Vaginitis in a soreness, swelling and redness (inflammation) of the vagina and vulva. Monilial vaginitis is not a sexually transmitted infection. CAUSES  Yeast vaginitis is caused by yeast (candida) that is normally found in your vagina. With a yeast infection, the candida has overgrown in number to a point that upsets the chemical balance. SYMPTOMS   White, thick vaginal discharge.  Swelling, itching, redness and irritation of the vagina and possibly the lips of the vagina (vulva).  Burning or painful urination.  Painful intercourse. DIAGNOSIS  Things that may contribute to monilial vaginitis are:  Postmenopausal and virginal states.  Pregnancy.  Infections.  Being tired, sick or stressed, especially if you had monilial vaginitis in the past.  Diabetes. Good control will help lower the chance.  Birth control pills.  Tight fitting garments.  Using bubble bath, feminine sprays, douches or deodorant tampons.  Taking certain medications that kill germs (antibiotics).  Sporadic recurrence can occur if you become ill. TREATMENT  Your caregiver will give you medication.  There are several kinds of anti monilial vaginal creams and suppositories specific for monilial vaginitis. For recurrent yeast infections, use a suppository or cream in the vagina 2 times a week, or as directed.  Anti-monilial or steroid cream for the itching or irritation of the vulva may also be used. Get your caregiver's permission.  Painting the vagina with methylene blue solution may help if the monilial cream does not work.  Eating yogurt may help prevent monilial vaginitis. HOME CARE INSTRUCTIONS   Finish all medication as prescribed.  Do not have sex until treatment is completed or after your caregiver tells you it is okay.  Take warm sitz baths.  Do not douche.  Do not use tampons, especially scented ones.  Wear cotton underwear.  Avoid tight pants and panty  hose.  Tell your sexual partner that you have a yeast infection. They should go to their caregiver if they have symptoms such as mild rash or itching.  Your sexual partner should be treated as well if your infection is difficult to eliminate.  Practice safer sex. Use condoms.  Some vaginal medications cause latex condoms to fail. Vaginal medications that harm condoms are:  Cleocin cream.  Butoconazole (Femstat).  Terconazole (Terazol) vaginal suppository.  Miconazole (Monistat) (may be purchased over the counter). SEEK MEDICAL CARE IF:   You have a temperature by mouth above 102 F (38.9 C).  The infection is getting worse after 2 days of treatment.  The infection is not getting better after 3 days of treatment.  You develop blisters in or around your vagina.  You develop vaginal bleeding, and it is not your menstrual period.  You have pain when you urinate.  You develop intestinal problems.  You have pain with sexual intercourse. Document Released: 10/30/2004 Document Revised: 04/14/2011 Document Reviewed: 07/14/2008 ExitCare Patient Information 2014 [employer].   Urinary Tract Infection Urinary tract infections (UTIs) can develop anywhere along your urinary tract. Your urinary tract is your body's drainage system for removing wastes and extra water. Your urinary tract includes two kidneys, two ureters, a bladder, and a urethra. Your kidneys are a pair of bean-shaped organs. Each kidney is about the size of your fist. They are located below your ribs, one on each side of your spine. CAUSES Infections are caused by microbes, which are microscopic organisms, including fungi, viruses, and bacteria. These organisms are so small that they can only be seen through a microscope. Bacteria are the microbes that most   commonly cause UTIs. SYMPTOMS  Symptoms of UTIs may vary by age and gender of the patient and by the location of the infection. Symptoms in Catherine Mitchell women  typically include a frequent and intense urge to urinate and a painful, burning feeling in the bladder or urethra during urination. Older women and men are more likely to be tired, shaky, and weak and have muscle aches and abdominal pain. A fever may mean the infection is in your kidneys. Other symptoms of a kidney infection include pain in your back or sides below the ribs, nausea, and vomiting. DIAGNOSIS To diagnose a UTI, your caregiver will ask you about your symptoms. Your caregiver also will ask to provide a urine sample. The urine sample will be tested for bacteria and white blood cells. White blood cells are made by your body to help fight infection. TREATMENT  Typically, UTIs can be treated with medication. Because most UTIs are caused by a bacterial infection, they usually can be treated with the use of antibiotics. The choice of antibiotic and length of treatment depend on your symptoms and the type of bacteria causing your infection. HOME CARE INSTRUCTIONS  If you were prescribed antibiotics, take them exactly as your caregiver instructs you. Finish the medication even if you feel better after you have only taken some of the medication.  Drink enough water and fluids to keep your urine clear or pale yellow.  Avoid caffeine, tea, and carbonated beverages. They tend to irritate your bladder.  Empty your bladder often. Avoid holding urine for long periods of time.  Empty your bladder before and after sexual intercourse.  After a bowel movement, women should cleanse from front to back. Use each tissue only once. SEEK MEDICAL CARE IF:   You have back pain.  You develop a fever.  Your symptoms do not begin to resolve within 3 days. SEEK IMMEDIATE MEDICAL CARE IF:   You have severe back pain or lower abdominal pain.  You develop chills.  You have nausea or vomiting.  You have continued burning or discomfort with urination. MAKE SURE YOU:   Understand these  instructions.  Will watch your condition.  Will get help right away if you are not doing well or get worse. Document Released: 10/30/2004 Document Revised: 07/22/2011 Document Reviewed: 02/28/2011 Exit[employer] Patient Information 2014 [employer].  

## 2013-02-18 LAB — URINE CULTURE: Colony Count: >=100000

## 2013-03-07 ENCOUNTER — Encounter (HOSPITAL_COMMUNITY): Payer: Self-pay | Admitting: Emergency Medicine

## 2013-03-07 ENCOUNTER — Emergency Department (HOSPITAL_COMMUNITY)
Admission: EM | Admit: 2013-03-07 | Discharge: 2013-03-07 | Disposition: A | Discharge status: Home / Self Care | Payer: BC Managed Care – PPO | Source: Home / Self Care | Attending: Family Medicine | Admitting: Family Medicine

## 2013-03-07 DIAGNOSIS — L0390 Cellulitis, unspecified: Secondary | ICD-10-CM

## 2013-03-07 DIAGNOSIS — L0291 Cutaneous abscess, unspecified: Secondary | ICD-10-CM

## 2013-03-07 MED ORDER — MUPIROCIN 2 % EX OINT: 1.0000 "application " | TOPICAL_OINTMENT | Freq: Two times a day (BID) | CUTANEOUS | Status: DC

## 2013-03-07 MED ORDER — DOXYCYCLINE HYCLATE 100 MG PO CAPS: 100.0000 mg | ORAL_CAPSULE | Freq: Two times a day (BID) | ORAL | Status: DC

## 2013-03-07 NOTE — ED Provider Notes (Signed)
Catherine SjogrenBetsy L Castagna is a 28 y.o. female who presents to Urgent Care today for left skin the inguinal abscess. This is been present for the last 2 days and slowly worsening and mildly painful. 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 No fevers or chills nausea vomiting or diarrhea.   Past Medical History  Diagnosis Date  . Hypoglycemia    History  Substance Use Topics  . Smoking status: Never Smoker   . Smokeless tobacco: Not on file  . Alcohol Use: Yes     Comment: occasional   ROS as above Medications: No current facility-administered medications for this encounter.   Current Outpatient Prescriptions  Medication Sig Dispense Refill  . carbamazepine (EPITOL) 200 MG tablet Take 400 mg by mouth once.      . hydrOXYzine (VISTARIL) 50 MG/ML injection Inject into the muscle every 6 (six) hours as needed for itching.      . traMADol (ULTRAM) 50 MG tablet Take by mouth every 6 (six) hours as needed.      . doxycycline (VIBRAMYCIN) 100 MG capsule Take 1 capsule (100 mg total) by mouth 2 (two) times daily.  20 capsule  0  . etonogestrel (IMPLANON) 68 MG IMPL implant Inject 1 each into the skin once.      . fluconazole (DIFLUCAN) 150 MG tablet Take 1 tablet (150 mg total) by mouth once.  1 tablet  1  . ibuprofen (ADVIL,MOTRIN) 200 MG tablet Take 400 mg by mouth every 8 (eight) hours as needed. ACHES AND PAIN       . mupirocin ointment (BACTROBAN) 2 % Place 1 application into the nose 2 (two) times daily.  22 g  2    Exam:  BP 110/50  Pulse 70  Temp(Src) 98.1 F (36.7 C) (Oral)  SpO2 100%  LMP 03/04/2013 Gen: Well NAD SKIN: Small erythematous tender fluctuant abscess left inguinal crease.  INCISION AND DRAINAGE Performed by: Clementeen GrahamOREY, EVAN, S Consent: Verbal consent obtained. Risks and benefits: risks, benefits and alternatives were discussed Type: abscess  Body area: Left inguinal crease skin  Anesthesia: local infiltration  Incision was made  with a scalpel.  Local anesthetic: lidocaine 2% w/ epinephrine  Anesthetic total: 5 ml  Complexity: simple Blunt dissection to break up loculations  Drainage: purulent  Drainage amount: small  Packing material: 1/4 in iodoform gauze. 1 inch  Patient tolerance: Patient tolerated the procedure well with no immediate complications.    Assessment and Plan: 28 y.o. female with skin abscess. Culture pending. Plan to treat with incision drainage and doxycycline. Patient will remove packing material herself in one or 2 days. Additionally we'll use Hibiclens and Bactroban for MRSA colonization eradication. Followup with primary care provider.   Discussed warning signs or symptoms. Please see discharge instructions. Patient expresses understanding.    Rodolph BongEvan S Corey, MD 03/07/13 [phone]

## 2013-03-07 NOTE — ED Notes (Signed)
C/o ? Mrsa/abscess lower left pelvic area along panty line.  Present x 2 days.  Hx of mrsa.  Denies pain and drainage.  Using antibiotic ointment.

## 2013-03-07 NOTE — Discharge Instructions (Signed)
Thank you for coming in today. Take doxycycline daily for 10 days. Use over-the-counter Hibiclens wash in the shower for the next month Use the antibiotic nose ointment for one month.  Use a Q-tip to rub a small amount of the ointment in your nostrils. Remove the packing material in one or 2 days.  Abscess Care After An abscess (also called a boil or furuncle) is an infected area that contains a collection of pus. Signs and symptoms of an abscess include pain, tenderness, redness, or hardness, or you may feel a moveable soft area under your skin. An abscess can occur anywhere in the body. The infection may spread to surrounding tissues causing cellulitis. A cut (incision) by the surgeon was made over your abscess and the pus was drained out. Gauze may have been packed into the space to provide a drain that will allow the cavity to heal from the inside outwards. The boil may be painful for 5 to 7 days. Most people with a boil do not have high fevers. Your abscess, if seen early, may not have localized, and may not have been lanced. If not, another appointment may be required for this if it does not get better on its own or with medications. HOME CARE INSTRUCTIONS   Only take over-the-counter or prescription medicines for pain, discomfort, or fever as directed by your caregiver.  When you bathe, soak and then remove gauze or iodoform packs at least daily or as directed by your caregiver. You may then wash the wound gently with mild soapy water. Repack with gauze or do as your caregiver directs. SEEK IMMEDIATE MEDICAL CARE IF:   You develop increased pain, swelling, redness, drainage, or bleeding in the wound site.  You develop signs of generalized infection including muscle aches, chills, fever, or a general ill feeling.  An oral temperature above 102 F (38.9 C) develops, not controlled by medication. See your caregiver for a recheck if you develop any of the symptoms described above. If  medications (antibiotics) were prescribed, take them as directed. Document Released: 08/08/2004 Document Revised: 04/14/2011 Document Reviewed: 04/05/2007 [facility] Patient Information 2014 [place], [place].

## 2013-03-11 ENCOUNTER — Telehealth (HOSPITAL_COMMUNITY): Payer: Self-pay | Admitting: *Deleted

## 2013-03-11 LAB — CULTURE, ROUTINE-ABSCESS: GRAM STAIN: NONE SEEN

## 2013-03-11 NOTE — ED Notes (Addendum)
Abscess culture L inguinal: Mod. MRSA. Pt. adequately treated with Doxycycline.  I called and cell number is not valid. Home number has not VM set up. Contact -mother's mobile number incorrect and home number busy.  Call 1.

## 2013-03-15 NOTE — ED Notes (Signed)
I called pt. Pt. verified x 2 and given results.  Pt. said she has had it before.  She is a LawyerCNA.   Pt. told she was adequately treated with the Doxycyline. [facility] her. She was not aware that the infections could keep coming back.  Pt. told to get treated with antibiotics with each outbreak. Pt. referred to the [facility] website to read more about it.  Pt. voiced understanding.  Vassie MoselleYork, Allyna Pittsley M 03/15/2013

## 2013-03-22 ENCOUNTER — Encounter: Payer: BC Managed Care – PPO | Admitting: Gynecology

## 2013-04-01 ENCOUNTER — Telehealth: Payer: Self-pay | Admitting: *Deleted

## 2013-04-01 DIAGNOSIS — N898 Other specified noninflammatory disorders of vagina: Secondary | ICD-10-CM

## 2013-04-01 MED ORDER — FLUCONAZOLE 150 MG PO TABS: 150.0000 mg | ORAL_TABLET | Freq: Once | ORAL | Status: DC

## 2013-04-01 NOTE — Telephone Encounter (Signed)
Ok, Diflucan 150 mg with refill, OV if no relief

## 2013-04-01 NOTE — Telephone Encounter (Signed)
Pt calling c/ yeast infection itching, white discharge was taking antibiotic. Pt requesting diflucan. Please advise

## 2013-04-01 NOTE — Telephone Encounter (Signed)
rx sent, pt informed.  

## 2013-04-12 ENCOUNTER — Ambulatory Visit (INDEPENDENT_AMBULATORY_CARE_PROVIDER_SITE_OTHER): Payer: BC Managed Care – PPO | Admitting: Women's Health

## 2013-04-12 ENCOUNTER — Encounter: Payer: Self-pay | Admitting: Women's Health

## 2013-04-12 ENCOUNTER — Other Ambulatory Visit (HOSPITAL_COMMUNITY)
Admission: RE | Admit: 2013-04-12 | Discharge: 2013-04-12 | Disposition: A | Discharge status: Home / Self Care | Payer: BC Managed Care – PPO | Source: Ambulatory Visit | Attending: Gynecology | Admitting: Gynecology

## 2013-04-12 VITALS — BP 116/78 | Ht 67.75 in | Wt 256.2 lb

## 2013-04-12 DIAGNOSIS — Z23 Encounter for immunization: Secondary | ICD-10-CM

## 2013-04-12 DIAGNOSIS — Z01419 Encounter for gynecological examination (general) (routine) without abnormal findings: Secondary | ICD-10-CM

## 2013-04-12 DIAGNOSIS — E079 Disorder of thyroid, unspecified: Secondary | ICD-10-CM

## 2013-04-12 DIAGNOSIS — R300 Dysuria: Secondary | ICD-10-CM

## 2013-04-12 LAB — CBC WITH DIFFERENTIAL/PLATELET
BASOS ABS: 0.1 10*3/uL (ref 0.0–0.1)
Basophils Relative: 1 % (ref 0–1)
EOS PCT: 2 % (ref 0–5)
Eosinophils Absolute: 0.2 10*3/uL (ref 0.0–0.7)
HEMATOCRIT: 37.7 % (ref 36.0–46.0)
HEMOGLOBIN: 13.2 g/dL (ref 12.0–15.0)
LYMPHS ABS: 2.2 10*3/uL (ref 0.7–4.0)
LYMPHS PCT: 29 % (ref 12–46)
MCH: 29.5 pg (ref 26.0–34.0)
MCHC: 35 g/dL (ref 30.0–36.0)
MCV: 84.3 fL (ref 78.0–100.0)
MONO ABS: 0.6 10*3/uL (ref 0.1–1.0)
MONOS PCT: 8 % (ref 3–12)
NEUTROS ABS: 4.6 10*3/uL (ref 1.7–7.7)
Neutrophils Relative %: 60 % (ref 43–77)
Platelets: 313 10*3/uL (ref 150–400)
RBC: 4.47 MIL/uL (ref 3.87–5.11)
RDW: 13.9 % (ref 11.5–15.5)
WBC: 7.6 10*3/uL (ref 4.0–10.5)

## 2013-04-12 LAB — URINALYSIS W MICROSCOPIC + REFLEX CULTURE
BILIRUBIN URINE: NEGATIVE
GLUCOSE, UA: NEGATIVE mg/dL
Hgb urine dipstick: NEGATIVE
Ketones, ur: NEGATIVE mg/dL
Leukocytes, UA: NEGATIVE
Nitrite: NEGATIVE
PH: 6 (ref 5.0–8.0)
PROTEIN: NEGATIVE mg/dL
Specific Gravity, Urine: 1.025 (ref 1.005–1.030)
Urobilinogen, UA: 0.2 mg/dL (ref 0.0–1.0)

## 2013-04-12 NOTE — Addendum Note (Signed)
Addended by: Richardson ChiquitoWILKINSON, KARI S on: 04/12/2013 12:42 PM   Modules accepted: Orders

## 2013-04-12 NOTE — Patient Instructions (Signed)

## 2013-04-12 NOTE — Progress Notes (Signed)
Etter SjogrenBetsy L Tredway 1985/12/13 [id number]    History:    Presents for annual exam.  Amenorrheic/Implanon 04/2011 [facility] the [facility] left inner arm, would like removed.  Has had 2 gardasil.  Reports questionable abnormal Pap in the past with no treatment, last Pap here 2011 normal. Same partner with negative screen. Had urethral stretching as a child. Has complaint of urinary burning. Complaint of weight gain and difficulty losing weight with increased exercise and diet.   Past medical history, past surgical history, family history and social history were all reviewed and documented in the EPIC chart. 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 Mother thyroid problem.  ROS:  A  ROS was performed and pertinent positives and negatives are included.  Exam:  Filed Vitals:   04/12/13 1034  BP: 116/78    General appearance:  Normal Thyroid:  Symmetrical, normal in size, without palpable masses or nodularity. Respiratory  Auscultation:  Clear without wheezing or rhonchi Cardiovascular  Auscultation:  Regular rate, without rubs, murmurs or gallops  Edema/varicosities:  Not grossly evident Abdominal  Soft,nontender, without masses, guarding or rebound.  Liver/spleen:  No organomegaly noted  Hernia:  None appreciated  Skin  Inspection:  Grossly normal   Breasts: Examined lying and sitting.     Right: Without masses, retractions, discharge or axillary adenopathy.     Left: Without masses, retractions, discharge or axillary adenopathy. Gentitourinary   Inguinal/mons:  Normal without inguinal adenopathy  External genitalia:  Normal  BUS/Urethra/Skene's glands:  Normal  Vagina:  Normal  Cervix:  Normal  Uterus:  normal in size, shape and contour.  Midline and mobile  Adnexa/parametria:     Rt: Without masses or tenderness.   Lt: Without masses or tenderness.  Anus and perineum: Normal  Digital rectal exam: Normal sphincter tone without palpated  masses or tenderness  Assessment/Plan:  28 y.o. SWF G0  for annual exam requesting Implanon to be removed - acne/weight gain/bloating/headaches.    Amenorrheic/Implanon 04/2011 Morbid obesity Contraception management   plan: Third  gardasil given. SBE's, regular exercise, calcium rich diet, MVI daily, and decrease calories for weight loss. Reviewed Weight Watchers. Contraception options reviewed, ParaGard IUD information given and reviewed, will have Dr. Audie BoxFontaine to place when Implanon removed. Reviewed risks of infection, perforation, hemorrhage. Handout given.  CBC, TSH, UA, Pap.     Harrington ChallengerYOUNG,NANCY J WHNP, 11:10 AM 04/12/2013

## 2013-04-12 NOTE — Addendum Note (Signed)
Addended by: Richardson ChiquitoWILKINSON, KARI S on: 04/12/2013 12:30 PM   Modules accepted: Orders

## 2013-04-13 LAB — TSH: TSH: 0.902 u[IU]/mL (ref 0.350–4.500)

## 2013-04-15 ENCOUNTER — Telehealth: Payer: Self-pay | Admitting: Gynecology

## 2013-04-15 NOTE — Telephone Encounter (Signed)
04/15/13-Unable to reach pt to let her know that the Paragard IUD is covered at 100%. Her phone number is discontinued even though I did check it with her this am for her appt.WL

## 2013-04-20 ENCOUNTER — Ambulatory Visit (INDEPENDENT_AMBULATORY_CARE_PROVIDER_SITE_OTHER): Payer: BC Managed Care – PPO | Admitting: Gynecology

## 2013-04-20 ENCOUNTER — Encounter: Payer: Self-pay | Admitting: Gynecology

## 2013-04-20 DIAGNOSIS — N882 Stricture and stenosis of cervix uteri: Secondary | ICD-10-CM

## 2013-04-20 DIAGNOSIS — Z30430 Encounter for insertion of intrauterine contraceptive device: Secondary | ICD-10-CM

## 2013-04-20 HISTORY — PX: INTRAUTERINE DEVICE INSERTION: SHX323

## 2013-04-20 NOTE — Patient Instructions (Signed)
Intrauterine Device Insertion   Most often, an intrauterine device (IUD) is inserted into the uterus to prevent pregnancy. There are 2 types of IUDs available:  · Copper IUD This type of IUD creates an environment that is not favorable to sperm survival. The mechanism of action of the copper IUD is not known for certain. It can stay in place for 10 years.  · Hormone IUD This type of IUD contains the hormone progestin (synthetic progesterone). The progestin thickens the cervical mucus and prevents sperm from entering the uterus, and it also thins the uterine lining. There is no evidence that the hormone IUD prevents implantation. One hormone IUD can stay in place for up to 5 years, and a different hormone IUD can stay in place for up to 3 years.  An IUD is the most cost-effective birth control if left in place for the full duration. It may be removed at any time.  LET YOUR HEALTH CARE PROVIDER KNOW ABOUT:  · Any allergies you have.  · All medicines you are taking, including vitamins, herbs, eye drops, creams, and over-the-counter medicines.  · Previous problems you or members of your family have had with the use of anesthetics.  · Any blood disorders you have.  · Previous surgeries you have had.  · Possibility of pregnancy.  · Medical conditions you have.  RISKS AND COMPLICATIONS   Generally, intrauterine device insertion is a safe procedure. However, as with any procedure, complications can occur. Possible complications include:  · Accidental puncture (perforation) of the uterus.  · Accidental placement of the IUD either in the muscle layer of the uterus (myometrium) or outside the uterus. If this happens, the IUD can be found essentially floating around the bowels and must be taken out surgically.  · The IUD may fall out of the uterus (expulsion). This is more common in women who have recently had a child.    · Pregnancy in the fallopian tube (ectopic).  · Pelvic inflammatory disease (PID), which is infection of  the uterus and fallopian tubes. The risk of PID is slightly increased in the first 20 days after the IUD is placed, but the overall risk is still very low.  BEFORE THE PROCEDURE  · Schedule the IUD insertion for when you will have your menstrual period or right after, to make sure you are not pregnant. Placement of the IUD is better tolerated shortly after a menstrual cycle.  · You may need to take tests or be examined to make sure you are not pregnant.  · You may be required to take a pregnancy test.  · You may be required to get checked for sexually transmitted infections (STIs) prior to placement. Placing an IUD in someone who has an infection can make the infection worse.  · You may be given a pain reliever to take 1 or 2 hours before the procedure.  · An exam will be performed to determine the size and position of your uterus.  · Ask your health care provider about changing or stopping your regular medicines.  PROCEDURE   · A tool (speculum) is placed in the vagina. This allows your health care provider to see the lower part of the uterus (cervix).  · The cervix is prepped with a medicine that lowers the risk of infection.  · You may be given a medicine to numb each side of the cervix (intracervical or paracervical block). This is used to block and control any discomfort with insertion.  · A tool (  uterine sound) is inserted into the uterus to determine the length of the uterine cavity and the direction the uterus may be tilted.  · A slim instrument (IUD inserter) is inserted through the cervical canal and into your uterus.  · The IUD is placed in the uterine cavity and the insertion device is removed.  · The nylon string that is attached to the IUD and used for eventual IUD removal is trimmed. It is trimmed so that it lays high in the vagina, just outside the cervix.  AFTER THE PROCEDURE  · You may have bleeding after the procedure. This is normal. It varies from light spotting for a few days to menstrual-like  bleeding.   · You may have mild cramping.  Document Released: 09/18/2010 Document Revised: 11/10/2012 Document Reviewed: 07/11/2012  ExitCare® Patient Information ©2014 [employer].

## 2013-04-20 NOTE — Progress Notes (Signed)
Patient presents for ParaGard IUD placement. She has read through the booklet and has no contraindications. She currently is on a normal menses and also has a nexplanon that she is going to have removed. She is having issues with weight gain that she is attributing to her hormones. Started with the birth control pills but seems to have continued but the nexplanon. Understands that removal may not correct the problem. I reviewed the insertional process with her as well as the risks to include infection, either immediate or long-term, uterine perforation or migration requiring surgery to remove, other complications such as pain, infertility and possibility of failure with subsequent pregnancy.   Exam with Selena BattenKim assistant Upper left arm examined with nexplanon wrought palpated under the skin. Pelvic: External BUS vagina normal. Cervix normal but slightly stenotic with slight menstrual flow. Uterus anteverted normal size shape contour midline mobile nontender. Adnexa without masses or tenderness.  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 Patient agrees with this.  Procedure: The cervix was cleansed with Betadine and a paracervical block using 1% lidocaine 8 cc total was placed. The wasanterior lip grasped with a single-tooth tenaculum and the cervix was gently dilated with a disposable gradated dilator. The uterus was sounded and a  ParaGard  IUD was placed according to manufacturer's recommendations without difficulty. The strings were trimmed. The patient tolerated well and will follow up in one month for a postinsertional check and removal of her nexplanon. .  Lot number:  [id number]:  [id number] Expiration January 2021  Note: This document was prepared with digital dictation and possible smart phrase technology. Any transcriptional errors that result from this process are unintentional.  Dara LordsFONTAINE,Frayda Egley P  MD, 9:55 AM 04/20/2013

## 2013-05-20 ENCOUNTER — Ambulatory Visit (INDEPENDENT_AMBULATORY_CARE_PROVIDER_SITE_OTHER): Payer: BC Managed Care – PPO | Admitting: Gynecology

## 2013-05-20 ENCOUNTER — Encounter: Payer: Self-pay | Admitting: Gynecology

## 2013-05-20 DIAGNOSIS — Z30431 Encounter for routine checking of intrauterine contraceptive device: Secondary | ICD-10-CM

## 2013-05-20 DIAGNOSIS — Z3046 Encounter for surveillance of implantable subdermal contraceptive: Secondary | ICD-10-CM

## 2013-05-20 NOTE — Progress Notes (Signed)
Catherine Mitchell 1985/06/13 [id number]        28 y.o.  G0P0 presents for followup IUD check having had a ParaGard IUD placed last month as well as to have her nexplanon removed. I reviewed the removal process with her and the risks to include infection, bleeding, neurovascular injury, nexplanon breakage and failed attempt to remove.  Past medical history,surgical history, problem list, medications, allergies, family history and social history were all reviewed and documented in the EPIC chart.  Directed ROS to system complaints/issues with pertinent positives and negatives documented in the history of present illness/assessment and plan.  Exam: Kim assistant General appearance  Normal Nexplanon palpated subcutaneously inner aspect left upper arm Pelvic external BUS vagina normal. Cervix normal with IUD strings visualized appropriate length. Uterus normal size midline mobile nontender. Adnexa without masses or tenderness.  Procedure: The skin overlying the distal portion of the nexplanon rod was cleansed with Betadine solution, the skin infiltrated with 1% lidocaine and a small incision was made. Through sharp and blunt dissection using a small forceps and scalpel the nexplanon tip was cleared of surrounding fibrosis and the rod was delivered through the incision and removed intact, shown to the patient and discarded. A Steri-Strip was placed to reapproximate the skin and a pressure dressing applied. Postoperative instructions were given to the patient.   Assessment/Plan:  28 y.o. G0P0  with normal followup IUD check up and successful nexplanon rod removal. Followup in one year, sooner as needed.   Note: This document was prepared with digital dictation and possible smart phrase technology. Any transcriptional errors that result from this process are unintentional.   Dara Lordsimothy P Jennae Hakeem MD, 10:06 AM 05/20/2013

## 2013-05-20 NOTE — Patient Instructions (Signed)
Removed dressing today following the procedure. Report any redness, drainage or increasing pain. Followup in one year for annual exam, sooner as needed.

## 2013-07-05 ENCOUNTER — Other Ambulatory Visit: Payer: Self-pay | Admitting: Urology

## 2013-07-05 ENCOUNTER — Ambulatory Visit (INDEPENDENT_AMBULATORY_CARE_PROVIDER_SITE_OTHER): Payer: BC Managed Care – PPO | Admitting: Urology

## 2013-07-05 DIAGNOSIS — N318 Other neuromuscular dysfunction of bladder: Secondary | ICD-10-CM

## 2013-07-05 DIAGNOSIS — R351 Nocturia: Secondary | ICD-10-CM

## 2013-07-05 DIAGNOSIS — R198 Other specified symptoms and signs involving the digestive system and abdomen: Secondary | ICD-10-CM

## 2013-07-05 DIAGNOSIS — N949 Unspecified condition associated with female genital organs and menstrual cycle: Secondary | ICD-10-CM

## 2013-07-08 ENCOUNTER — Ambulatory Visit (HOSPITAL_COMMUNITY): Payer: BC Managed Care – PPO

## 2013-08-16 ENCOUNTER — Ambulatory Visit (INDEPENDENT_AMBULATORY_CARE_PROVIDER_SITE_OTHER): Payer: BC Managed Care – PPO | Admitting: Urology

## 2013-08-16 DIAGNOSIS — N318 Other neuromuscular dysfunction of bladder: Secondary | ICD-10-CM

## 2013-08-16 DIAGNOSIS — R351 Nocturia: Secondary | ICD-10-CM

## 2013-09-07 ENCOUNTER — Ambulatory Visit (INDEPENDENT_AMBULATORY_CARE_PROVIDER_SITE_OTHER): Payer: BC Managed Care – PPO | Admitting: Gynecology

## 2013-09-07 ENCOUNTER — Encounter: Payer: Self-pay | Admitting: Gynecology

## 2013-09-07 DIAGNOSIS — N943 Premenstrual tension syndrome: Secondary | ICD-10-CM

## 2013-09-07 DIAGNOSIS — N926 Irregular menstruation, unspecified: Secondary | ICD-10-CM

## 2013-09-07 DIAGNOSIS — R350 Frequency of micturition: Secondary | ICD-10-CM

## 2013-09-07 LAB — URINALYSIS W MICROSCOPIC + REFLEX CULTURE
BILIRUBIN URINE: NEGATIVE
CASTS: NONE SEEN
CRYSTALS: NONE SEEN
GLUCOSE, UA: NEGATIVE mg/dL
Nitrite: NEGATIVE
PH: 5.5 (ref 5.0–8.0)
SPECIFIC GRAVITY, URINE: 1.02 (ref 1.005–1.030)
Urobilinogen, UA: 0.2 mg/dL (ref 0.0–1.0)

## 2013-09-07 LAB — PREGNANCY, URINE: Preg Test, Ur: NEGATIVE

## 2013-09-07 MED ORDER — ALPRAZOLAM 0.25 MG PO TABS: 0.2500 mg | ORAL_TABLET | Freq: Two times a day (BID) | ORAL | Status: DC | PRN

## 2013-09-07 NOTE — Addendum Note (Signed)
Addended by: Dayna BarkerGARDNER, KIMBERLY K on: 09/07/2013 02:48 PM   Modules accepted: Orders

## 2013-09-07 NOTE — Progress Notes (Signed)
Etter SjogrenBetsy L Bark 1985-09-21 [id number]        28 y.o.  G0P0 presents late for her menses. LMP 07/31/2013. Has ParaGard IUD. Also recently passed a kidney stone several days ago. Still having some frequency but otherwise doing well without dysuria, low back pain, fever or chills.  Past medical history,surgical history, problem list, medications, allergies, family history and social history were all reviewed and documented in the EPIC chart.  Directed ROS with pertinent positives and negatives documented in the history of present illness/assessment and plan.  Exam: Kim assistant General appearance:  Normal Spine straight without CVA tenderness Abdomen soft nontender without masses guarding rebound Pelvic external BUS vagina normal. Cervix normal. IUD strings visualized. Uterus normal size midline mobile nontender. Adnexa without masses or tenderness.  Assessment/Plan:  28 y.o. G0P0 with :  1. Recent passage of kidney stone. Some frequency. Urinalysis does show white cells and bacteria but also many squamous cells. Given the lack of other symptoms will await culture results and treat accordingly. Patient will followup regardless if symptoms persist or worsen. 2. Late for menses by approximately one week. UPT negative today. Will await menses. Call if no menses in 1-2 weeks. 3. PMS symptoms. Patient notes over the last year she has significant irritability a day or 2 before her menses. Dr. Tenny Crawoss in the past had prescribed Prozac which she just took one or 2 pills a day or 2 before her period and that seemed to help. I reviewed various strategies and options. Ultimately we'll try Xanax 0.25 mg #30 no refill to use 1 or 2 days premenstrual to see if this doesn't help. Assuming it does, she'll call if she needs a refill before her next annual.   Note: This document was prepared with digital dictation and possible smart phrase technology. Any transcriptional errors that result from this process are  unintentional.   Dara LordsFONTAINE,Jodeen Mclin P MD, 12:55 PM 09/07/2013

## 2013-09-07 NOTE — Patient Instructions (Signed)
Call if your urinary symptoms persist or worsen.  Call if your period does not start in the next one to 2 weeks

## 2013-09-09 ENCOUNTER — Other Ambulatory Visit: Payer: Self-pay | Admitting: Gynecology

## 2013-09-09 DIAGNOSIS — N3001 Acute cystitis with hematuria: Secondary | ICD-10-CM

## 2013-09-09 LAB — URINE CULTURE

## 2013-09-09 MED ORDER — NITROFURANTOIN MONOHYD MACRO 100 MG PO CAPS: 100.0000 mg | ORAL_CAPSULE | Freq: Two times a day (BID) | ORAL | Status: DC

## 2013-10-11 ENCOUNTER — Telehealth: Payer: Self-pay | Admitting: *Deleted

## 2013-10-11 MED ORDER — ALPRAZOLAM 0.25 MG PO TABS: 0.2500 mg | ORAL_TABLET | Freq: Two times a day (BID) | ORAL | Status: DC | PRN

## 2013-10-11 NOTE — Telephone Encounter (Signed)
Pt calling requesting refill on xanax 0.25 mg you last filled on 09/07/13. Please advise

## 2013-10-11 NOTE — Telephone Encounter (Signed)
Okay to refill Xanax 0.25 #30 times one with no refills. The patient was being treated for PMS 1-2 days before her menses which means she really should have only used a handful of pills but it looks like she has already used 30 pills within one month. If she is requiring this much then she may want to consider alternative treatment such as fluoxetine daily.

## 2013-10-11 NOTE — Telephone Encounter (Signed)
rx called in, pt in informed with the below note.

## 2013-11-01 ENCOUNTER — Ambulatory Visit (INDEPENDENT_AMBULATORY_CARE_PROVIDER_SITE_OTHER): Payer: BC Managed Care – PPO | Admitting: Gynecology

## 2013-11-01 ENCOUNTER — Encounter: Payer: Self-pay | Admitting: Gynecology

## 2013-11-01 DIAGNOSIS — Z113 Encounter for screening for infections with a predominantly sexual mode of transmission: Secondary | ICD-10-CM

## 2013-11-01 DIAGNOSIS — R350 Frequency of micturition: Secondary | ICD-10-CM

## 2013-11-01 LAB — URINALYSIS W MICROSCOPIC + REFLEX CULTURE
Bilirubin Urine: NEGATIVE
CRYSTALS: NONE SEEN
Casts: NONE SEEN
Glucose, UA: NEGATIVE mg/dL
Ketones, ur: NEGATIVE mg/dL
LEUKOCYTES UA: NEGATIVE
NITRITE: NEGATIVE
Protein, ur: NEGATIVE mg/dL
SPECIFIC GRAVITY, URINE: 1.02 (ref 1.005–1.030)
UROBILINOGEN UA: 0.2 mg/dL (ref 0.0–1.0)
pH: 6 (ref 5.0–8.0)

## 2013-11-01 LAB — WET PREP FOR TRICH, YEAST, CLUE
TRICH WET PREP: NONE SEEN
YEAST WET PREP: NONE SEEN

## 2013-11-01 MED ORDER — METRONIDAZOLE 500 MG PO TABS: 500.0000 mg | ORAL_TABLET | Freq: Two times a day (BID) | ORAL | Status: DC

## 2013-11-01 NOTE — Progress Notes (Signed)
Catherine Mitchell [birth year] [id number]        28 y.o.  G0P0 Presents complaining of several days of urinary frequency, mild vaginal irritation and slight discharge. No odor. No dysuria or urgency. Does have a history of kidney stones but does not feel like this. Also asked for STD screening recently finding out that her boyfriend has been unfaithful.  Past medical history,surgical history, problem list, medications, allergies, family history and social history were all reviewed and documented in the EPIC chart.  Directed ROS with pertinent positives and negatives documented in the history of present illness/assessment and plan.  Exam: Kim assistant General appearance:  Normal Spine straight without CVA tenderness Abdomen soft nontender without masses guarding rebound Pelvic external BUS vagina with slight white discharge. Cervix normal. IUD strings visualized. Uterus normal size midline mobile nontender. Adnexa without masses or tenderness.  Assessment/Plan:  28 y.o. G0P0 with urinary frequency. Urinalysis questionable contaminated versus low-grade UTI. Will follow up with culture and treat per culture results. Slight white discharge with irritation. Wet prep consistent with bacterial vaginosis. We'll treat with Flagyl 500 mg twice a day x7 days, alcohol avoidance reviewed.  STD screening request. GC/Chlamydia, HIV, RPR, hepatitis B, hepatitis C done. Reviewed need to repeat serum screening in 3-6 months to make sure she does not convert for completeness.     Dara LordsFONTAINE,Adilee Lemme P MD, 9:47 AM 11/01/2013

## 2013-11-01 NOTE — Addendum Note (Signed)
Addended by: Dayna BarkerGARDNER, KIMBERLY K on: 11/01/2013 04:36 PM   Modules accepted: Orders

## 2013-11-01 NOTE — Patient Instructions (Signed)
Take the Flagyl medication twice daily for 7 days.  Avoid alcohol while taking. 

## 2013-11-02 ENCOUNTER — Telehealth: Payer: Self-pay | Admitting: *Deleted

## 2013-11-02 LAB — HIV ANTIBODY (ROUTINE TESTING W REFLEX): HIV 1&2 Ab, 4th Generation: NONREACTIVE

## 2013-11-02 LAB — HEPATITIS C ANTIBODY: HCV Ab: NEGATIVE

## 2013-11-02 LAB — HEPATITIS B SURFACE ANTIGEN: Hepatitis B Surface Ag: NEGATIVE

## 2013-11-02 LAB — RPR

## 2013-11-02 LAB — GC/CHLAMYDIA PROBE AMP
CT PROBE, AMP APTIMA: NEGATIVE
GC PROBE AMP APTIMA: NEGATIVE

## 2013-11-02 NOTE — Telephone Encounter (Signed)
Cleocin vaginal cream nightly x7 days 

## 2013-11-02 NOTE — Telephone Encounter (Signed)
Pt was prescribed flagyl 500 mg x 7 days starting taking medication and c/o headaches and periods of time when she cant catch her breath. Pt has stopped medication, has taking in past with no problems. Pt asked if alternate medication could be given?

## 2013-11-03 ENCOUNTER — Other Ambulatory Visit: Payer: Self-pay | Admitting: Gynecology

## 2013-11-03 DIAGNOSIS — R3129 Other microscopic hematuria: Secondary | ICD-10-CM

## 2013-11-03 LAB — URINE CULTURE
Colony Count: NO GROWTH
ORGANISM ID, BACTERIA: NO GROWTH

## 2013-11-03 MED ORDER — CLINDAMYCIN PHOSPHATE 2 % VA CREA: TOPICAL_CREAM | VAGINAL | Status: DC

## 2013-11-03 NOTE — Telephone Encounter (Signed)
Left on voicemail,rx sent. 

## 2013-11-07 ENCOUNTER — Other Ambulatory Visit: Payer: BC Managed Care – PPO

## 2013-11-07 DIAGNOSIS — R3129 Other microscopic hematuria: Secondary | ICD-10-CM

## 2013-11-08 LAB — URINALYSIS W MICROSCOPIC + REFLEX CULTURE
Bilirubin Urine: NEGATIVE
CASTS: NONE SEEN
Crystals: NONE SEEN
Glucose, UA: NEGATIVE mg/dL
Hgb urine dipstick: NEGATIVE
Ketones, ur: NEGATIVE mg/dL
LEUKOCYTES UA: NEGATIVE
NITRITE: NEGATIVE
PH: 6.5 (ref 5.0–8.0)
Protein, ur: NEGATIVE mg/dL
SPECIFIC GRAVITY, URINE: 1.027 (ref 1.005–1.030)
Urobilinogen, UA: 0.2 mg/dL (ref 0.0–1.0)

## 2013-11-09 ENCOUNTER — Encounter: Payer: Self-pay | Admitting: Gynecology

## 2013-11-09 ENCOUNTER — Ambulatory Visit (INDEPENDENT_AMBULATORY_CARE_PROVIDER_SITE_OTHER): Payer: BC Managed Care – PPO | Admitting: Gynecology

## 2013-11-09 DIAGNOSIS — N898 Other specified noninflammatory disorders of vagina: Secondary | ICD-10-CM

## 2013-11-09 DIAGNOSIS — R350 Frequency of micturition: Secondary | ICD-10-CM

## 2013-11-09 DIAGNOSIS — R102 Pelvic and perineal pain: Secondary | ICD-10-CM

## 2013-11-09 LAB — URINALYSIS W MICROSCOPIC + REFLEX CULTURE
Bilirubin Urine: NEGATIVE
Glucose, UA: NEGATIVE mg/dL
Hgb urine dipstick: NEGATIVE
KETONES UR: NEGATIVE mg/dL
LEUKOCYTES UA: NEGATIVE
Nitrite: NEGATIVE
PH: 7 (ref 5.0–8.0)
Protein, ur: NEGATIVE mg/dL
SPECIFIC GRAVITY, URINE: 1.02 (ref 1.005–1.030)
Urobilinogen, UA: 0.2 mg/dL (ref 0.0–1.0)

## 2013-11-09 MED ORDER — CIPROFLOXACIN HCL 250 MG PO TABS: 250.0000 mg | ORAL_TABLET | Freq: Two times a day (BID) | ORAL | Status: DC

## 2013-11-09 MED ORDER — FLUCONAZOLE 150 MG PO TABS: 150.0000 mg | ORAL_TABLET | ORAL | Status: DC

## 2013-11-09 NOTE — Progress Notes (Addendum)
Catherine SjogrenBetsy L Mitchell 09-04-1985 [id number]        28 y.o.  G0P0 presents complaining of continuing urinary frequency, vaginal irritation and pelvic discomfort. Was evaluated 11/01/2013 and treated for bacterial vaginosis with Flagyl. Her urinalysis did not grow out any bacteria at that time. She is on Vesicare for urgency symptoms. Notes that she still is going frequently with small volumes. Also with some vaginal irritation and generalized pelvic discomfort. She did also have a recent GC Chlamydia and STD blood work all of which were negative.  No fever chills nausea vomiting diarrhea constipation low back pain.  Past medical history,surgical history, problem list, medications, allergies, family history and social history were all reviewed and documented in the EPIC chart.  Directed ROS with pertinent positives and negatives documented in the history of present illness/assessment and plan.  Exam: Kim assistant General appearance:  Normal Spine straight without CVA tenderness Abdomen soft with reported tenderness suprapubically. No masses guarding rebound Pelvic external BUS vagina normal. Cervix normal with IUD string visualized. Uterus grossly normal size midline mobile nontender adnexa without masses or tenderness  Assessment/Plan:  28 y.o. G0P0 with urinary frequency. Urinalysis is negative. Was treated in August for Escherichia coli UTI. Will cover for urethritis with ciprofloxacin 250 mg twice a day x7 days. Will also cover for yeast given the vaginal irritation complaint with Diflucan 150 mg x2 doses every other day. We'll also schedule pelvic ultrasound to rule out nonpalpable abnormalities given her diffuse pelvic discomfort.     Dara LordsFONTAINE,Giamarie Bueche P MD, 10:59 AM 11/09/2013

## 2013-11-09 NOTE — Patient Instructions (Signed)
Take the ciprofloxacin antibiotic twice daily for 7 days Take the Diflucan antibiotic every other day for 2 doses. Follow up for ultrasound as scheduled.

## 2013-11-11 ENCOUNTER — Ambulatory Visit (INDEPENDENT_AMBULATORY_CARE_PROVIDER_SITE_OTHER): Payer: BC Managed Care – PPO | Admitting: Gynecology

## 2013-11-11 ENCOUNTER — Ambulatory Visit (INDEPENDENT_AMBULATORY_CARE_PROVIDER_SITE_OTHER): Payer: BC Managed Care – PPO

## 2013-11-11 ENCOUNTER — Encounter: Payer: Self-pay | Admitting: Gynecology

## 2013-11-11 ENCOUNTER — Other Ambulatory Visit: Payer: Self-pay | Admitting: Gynecology

## 2013-11-11 DIAGNOSIS — R102 Pelvic and perineal pain: Secondary | ICD-10-CM

## 2013-11-11 DIAGNOSIS — Z30431 Encounter for routine checking of intrauterine contraceptive device: Secondary | ICD-10-CM

## 2013-11-11 DIAGNOSIS — N930 Postcoital and contact bleeding: Secondary | ICD-10-CM

## 2013-11-11 DIAGNOSIS — R350 Frequency of micturition: Secondary | ICD-10-CM

## 2013-11-11 DIAGNOSIS — R1032 Left lower quadrant pain: Secondary | ICD-10-CM

## 2013-11-11 NOTE — Progress Notes (Signed)
[name] 1985/12/29 [id number]        28 y.o.  G0P0 Presents for follow up ultrasound.  Recent evaluation 11/09/2013 for continuing urinary frequency, vaginal irritation and pelvic discomfort. Was evaluated 11/01/2013 and treated for bacterial vaginosis with Flagyl. Her urinalysis did not grow out any bacteria at that time. She is on Vesicare for urgency symptoms. Notes that she still is going frequently with small volumes. Also with some vaginal irritation and generalized pelvic discomfort. She did also have a recent GC Chlamydia and STD blood work all of which were negative.  No fever chills nausea vomiting diarrhea constipation low back pain.  Urinalysis was negative.  Exam was negative.  She was treated with ciprofloxacin 250 twice a day x7 days and Diflucan 150 mg every other day x2 doses. Was scheduled for ultrasound due to the diffuse pelvic discomfort just to rule out nonpalpable abnormalities.    Past medical history,surgical history, problem list, medications, allergies, family history and social history were all reviewed and documented in the EPIC chart.  Directed ROS with pertinent positives and negatives documented in the history of present illness/assessment and plan.  Ultrasound shows uterus normal size and echotexture. Endometrial echo 5.8 mm. IUD shown in proper rotation. Right and left ovaries normal with physiologic changes. Cul-de-sac negative  Assessment/Plan:  28 y.o. G0P0 with above history. Says that she's feeling somewhat better. She'll complete her course of ciprofloxacin and take her second Diflucan today. Follow up if her symptoms persist, worsen or recur.     Dara LordsFONTAINE,Verlie Hellenbrand P MD, 10:15 AM 11/11/2013

## 2013-11-11 NOTE — Patient Instructions (Signed)
Follow up if your symptoms persist, worsen or recur

## 2013-12-22 ENCOUNTER — Telehealth: Payer: Self-pay | Admitting: *Deleted

## 2013-12-22 NOTE — Telephone Encounter (Signed)
Pt called requesting Dilfucan 150 mg tablet states has recurrent yeast and Rx has been sent to pharmacy. Pt fully aware OV best, itching and white discharge. Please advise

## 2013-12-23 MED ORDER — FLUCONAZOLE 150 MG PO TABS: 150.0000 mg | ORAL_TABLET | Freq: Once | ORAL | Status: DC

## 2013-12-23 NOTE — Telephone Encounter (Signed)
Okay for Diflucan 150 mg #1

## 2013-12-23 NOTE — Telephone Encounter (Signed)
Pt informed, rx sent 

## 2014-01-09 ENCOUNTER — Telehealth: Payer: Self-pay | Admitting: *Deleted

## 2014-01-09 MED ORDER — FLUCONAZOLE 150 MG PO TABS: 150.0000 mg | ORAL_TABLET | Freq: Once | ORAL | Status: DC

## 2014-01-09 NOTE — Telephone Encounter (Signed)
Pt was prescribed Diflucan tablet on 12/22/13 x 1 dose, took pill, now states yeast has returned. Pt asked if you would be willing to refill? Please advise

## 2014-01-09 NOTE — Telephone Encounter (Signed)
rx sent and pt informed

## 2014-01-09 NOTE — Telephone Encounter (Signed)
Diflucan 150mg 1 dose

## 2014-01-12 ENCOUNTER — Ambulatory Visit (INDEPENDENT_AMBULATORY_CARE_PROVIDER_SITE_OTHER): Payer: BC Managed Care – PPO | Admitting: Gynecology

## 2014-01-12 ENCOUNTER — Encounter: Payer: Self-pay | Admitting: Gynecology

## 2014-01-12 DIAGNOSIS — N760 Acute vaginitis: Secondary | ICD-10-CM

## 2014-01-12 MED ORDER — CLINDAMYCIN PHOSPHATE 2 % VA CREA
1.0000 | TOPICAL_CREAM | Freq: Every day | VAGINAL | Status: DC
Start: 2014-01-12 — End: 2014-06-01

## 2014-01-12 MED ORDER — FLUCONAZOLE 200 MG PO TABS: 200.0000 mg | ORAL_TABLET | Freq: Every day | ORAL | Status: DC

## 2014-01-12 NOTE — Patient Instructions (Addendum)
Take Diflucan pill daily for 5 days Use Cleocin vaginal cream nightly 7 days. Follow up if your symptoms persist, worsen or recur.

## 2014-01-12 NOTE — Progress Notes (Signed)
Catherine Mitchell 1985-06-05 [id number]        28 y.o.  G0P0 presents complaining of vaginal irritation. Have been treated several times with Diflucan over the phone and notes that she had a discharge before which seems better but now she still has itching and irritation. No urinary symptoms such as frequency dysuria or urgency.  Past medical history,surgical history, problem list, medications, allergies, family history and social history were all reviewed and documented in the EPIC chart.  Directed ROS with pertinent positives and negatives documented in the history of present illness/assessment and plan.  Exam: Kim assistant General appearance:  Normal Abdomen soft nontender without masses guarding rebound Pelvic external BUS vagina with generalized vulvitis with erythema. No specific lesions or excoriation. Vagina erythematous without significant discharge. Cervix normal with IUD strings visualized. Uterus grossly normal midline mobile nontender. Adnexa without masses or tenderness.  Wet prep unremarkable  Assessment/Plan:  28 y.o. G0P0 with above symptoms. Wet prep is unremarkable. Does have evidence of vaginal irritation on exam. Will cover with Diflucan  200 mg 5 days to eradicate any residual yeast and rectal reserve. Cleocin vaginal cream nightly 7 days to treat any low-grade bacterial vaginosis. Follow up if symptoms persist, worsen or recur. Possible boric acid suppression reviewed. We'll wait and see if she has any recurrences following this.     Dara LordsFONTAINE,Aracelis Ulrey P MD, 4:16 PM 01/12/2014

## 2014-01-17 LAB — WET PREP FOR TRICH, YEAST, CLUE
Clue Cells Wet Prep HPF POC: NONE SEEN
Trich, Wet Prep: NONE SEEN
Yeast Wet Prep HPF POC: NONE SEEN

## 2014-01-17 NOTE — Addendum Note (Signed)
Addended by: Dayna BarkerGARDNER, KIMBERLY K on: 01/17/2014 09:01 AM   Modules accepted: Orders

## 2014-01-25 ENCOUNTER — Telehealth: Payer: Self-pay | Admitting: *Deleted

## 2014-01-25 MED ORDER — ALPRAZOLAM 0.25 MG PO TABS: 0.2500 mg | ORAL_TABLET | Freq: Every evening | ORAL | Status: DC | PRN

## 2014-01-25 NOTE — Telephone Encounter (Signed)
Xanax 0.25 mg #30 one by mouth every 6 hours when necessary anxiety one refill okay

## 2014-01-25 NOTE — Telephone Encounter (Signed)
Pt calling requesting refill on xanax 0.25mg  last filled in September. Please advise

## 2014-01-25 NOTE — Telephone Encounter (Signed)
rx called in, pt informed. 

## 2014-03-17 ENCOUNTER — Other Ambulatory Visit: Payer: Self-pay | Admitting: Gynecology

## 2014-03-17 ENCOUNTER — Telehealth: Payer: Self-pay

## 2014-03-17 MED ORDER — FLUCONAZOLE 200 MG PO TABS: 200.0000 mg | ORAL_TABLET | Freq: Every day | ORAL | Status: DC

## 2014-03-17 NOTE — Telephone Encounter (Signed)
Diflucan 200 mg #2 one by mouth daily okay

## 2014-03-17 NOTE — Telephone Encounter (Signed)
Patient c/o yeast infection. Familiar with symptoms. Asked if you would call her in a Diflucan and she said you usually put at refill on it because sometimes it takes 2 tablets to clear her symptoms.

## 2014-03-20 ENCOUNTER — Other Ambulatory Visit: Payer: Self-pay | Admitting: Gynecology

## 2014-03-20 MED ORDER — FLUCONAZOLE 200 MG PO TABS
200.0000 mg | ORAL_TABLET | Freq: Every day | ORAL | Status: DC
Start: 2014-03-20 — End: 2014-06-01

## 2014-03-20 NOTE — Telephone Encounter (Signed)
Patient called today saying she never heard back from me Friday and is requesting Diflucan. I called her immediately and apologized for failing to call and let her know [facility]. She said that she checked but they told her that they had not received anything.  The system confirms receipt by pharmacy.  Regardless, patient is in [place] now and would like me to send it to her pharmacy here. [facility]

## 2014-05-22 ENCOUNTER — Telehealth: Payer: Self-pay

## 2014-05-22 MED ORDER — FLUCONAZOLE 150 MG PO TABS: 150.0000 mg | ORAL_TABLET | Freq: Once | ORAL | Status: DC

## 2014-05-22 NOTE — Telephone Encounter (Signed)
Patient called back and scheduled annual exam for 06/01/14 per Rafael Hernandezlaudia.  Rx sent.

## 2014-05-22 NOTE — Telephone Encounter (Signed)
Left message on her voice mail to call and schedule CE since overdue. Once scheduled I will send Rx.

## 2014-05-22 NOTE — Telephone Encounter (Signed)
Patient called requesting Rx for Diflucan as she has vag yeast infection and is familiar with symptoms. (Overdue for CE since 3/15-will have her make an appointment.)

## 2014-05-22 NOTE — Telephone Encounter (Signed)
Ok diflucan 150 mg office visit for annual and if no relief.

## 2014-06-01 ENCOUNTER — Ambulatory Visit (INDEPENDENT_AMBULATORY_CARE_PROVIDER_SITE_OTHER): Payer: 59 | Admitting: Women's Health

## 2014-06-01 ENCOUNTER — Encounter: Payer: Self-pay | Admitting: Women's Health

## 2014-06-01 VITALS — BP 120/76 | Ht 68.0 in | Wt 224.0 lb

## 2014-06-01 DIAGNOSIS — B373 Candidiasis of vulva and vagina: Secondary | ICD-10-CM

## 2014-06-01 DIAGNOSIS — Z13220 Encounter for screening for lipoid disorders: Secondary | ICD-10-CM | POA: Diagnosis not present

## 2014-06-01 DIAGNOSIS — Z833 Family history of diabetes mellitus: Secondary | ICD-10-CM

## 2014-06-01 DIAGNOSIS — Z01419 Encounter for gynecological examination (general) (routine) without abnormal findings: Secondary | ICD-10-CM | POA: Diagnosis not present

## 2014-06-01 DIAGNOSIS — B3731 Acute candidiasis of vulva and vagina: Secondary | ICD-10-CM

## 2014-06-01 LAB — CBC WITH DIFFERENTIAL/PLATELET
Basophils Absolute: 0.1 10*3/uL (ref 0.0–0.1)
Basophils Relative: 1 % (ref 0–1)
EOS ABS: 0 10*3/uL (ref 0.0–0.7)
Eosinophils Relative: 0 % (ref 0–5)
HEMATOCRIT: 40.7 % (ref 36.0–46.0)
HEMOGLOBIN: 13.8 g/dL (ref 12.0–15.0)
LYMPHS ABS: 2 10*3/uL (ref 0.7–4.0)
Lymphocytes Relative: 29 % (ref 12–46)
MCH: 29.7 pg (ref 26.0–34.0)
MCHC: 33.9 g/dL (ref 30.0–36.0)
MCV: 87.5 fL (ref 78.0–100.0)
MONOS PCT: 6 % (ref 3–12)
MPV: 10.2 fL (ref 8.6–12.4)
Monocytes Absolute: 0.4 10*3/uL (ref 0.1–1.0)
NEUTROS ABS: 4.5 10*3/uL (ref 1.7–7.7)
Neutrophils Relative %: 64 % (ref 43–77)
Platelets: 284 10*3/uL (ref 150–400)
RBC: 4.65 MIL/uL (ref 3.87–5.11)
RDW: 12.8 % (ref 11.5–15.5)
WBC: 7 10*3/uL (ref 4.0–10.5)

## 2014-06-01 MED ORDER — FLUCONAZOLE 100 MG PO TABS: ORAL_TABLET | ORAL | Status: DC

## 2014-06-01 NOTE — Patient Instructions (Signed)

## 2014-06-01 NOTE — Progress Notes (Signed)
Catherine SjogrenBetsy L Mitchell [birth year] [id number]    History:    Presents for annual exam.  Regular monthly cycle ParaGard IUD placed 04/2013. States feels like she gets a yeast infection prior to cycle most months using over-the-counter Monistat. Abnormal Pap several years ago with no treatment normal Paps after. Negative STD screen 10/2013 same partner. Has lost 25 pounds in the past year with diet and exercise. Bipolar primary care manages meds.  Past medical history, past surgical history, family history and social history were all reviewed and documented in the EPIC chart. 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 Mother thyroid disease.  ROS:  A ROS was performed and pertinent positives and negatives are included.  Exam:  Filed Vitals:   06/01/14 0858  BP: 120/76    General appearance:  Normal Thyroid:  Symmetrical, normal in size, without palpable masses or nodularity. Respiratory  Auscultation:  Clear without wheezing or rhonchi Cardiovascular  Auscultation:  Regular rate, without rubs, murmurs or gallops  Edema/varicosities:  Not grossly evident Abdominal  Soft,nontender, without masses, guarding or rebound.  Liver/spleen:  No organomegaly noted  Hernia:  None appreciated  Skin  Inspection:  Grossly normal   Breasts: Examined lying and sitting.     Right: Without masses, retractions, discharge or axillary adenopathy.     Left: Without masses, retractions, discharge or axillary adenopathy. Gentitourinary   Inguinal/mons:  Normal without inguinal adenopathy  External genitalia:  Normal  BUS/Urethra/Skene's glands:  Normal  Vagina:  Normal  Cervix:  Normal IUD strings visible  Uterus:   normal in size, shape and contour.  Midline and mobile  Adnexa/parametria:     Rt: Without masses or tenderness.   Lt: Without masses or tenderness.  Anus and perineum: Normal  Digital rectal exam: Normal sphincter tone without palpated masses or tenderness  Assessment/Plan:  29 y.o. S  WF G0 for annual exam.    Symptomatic yeast monthly with menses ParaGard IUD placed 04/2013  Plan: Diflucan 100 by mouth monthly with menses, prescription, proper use given and reviewed call if no relief. SBE's, continue regular exercise, calcium rich diet, MVI daily encouraged. Condoms encouraged until permanent partner. CBC, glucose, lipid panel, TSH, UA, Pap normal 2015, new screening guidelines reviewed.   Harrington ChallengerYOUNG,Renji Berwick J WHNP, 10:08 AM 06/01/2014

## 2014-06-02 LAB — URINALYSIS W MICROSCOPIC + REFLEX CULTURE
BILIRUBIN URINE: NEGATIVE
Bacteria, UA: NONE SEEN
CASTS: NONE SEEN
CRYSTALS: NONE SEEN
Glucose, UA: NEGATIVE mg/dL
Ketones, ur: NEGATIVE mg/dL
Nitrite: NEGATIVE
PROTEIN: 30 mg/dL — AB
RBC / HPF: >50 RBC/hpf — AB (ref <3)
Specific Gravity, Urine: >1.03 — ABNORMAL HIGH (ref 1.005–1.030)
Urobilinogen, UA: 0.2 mg/dL (ref 0.0–1.0)
pH: 6.5 (ref 5.0–8.0)

## 2014-06-02 LAB — LIPID PANEL
Cholesterol: 227 mg/dL — ABNORMAL HIGH (ref 0–200)
HDL: 71 mg/dL (ref >=46)
LDL CALC: 132 mg/dL — AB (ref 0–99)
Total CHOL/HDL Ratio: 3.2 Ratio
Triglycerides: 120 mg/dL (ref <150)
VLDL: 24 mg/dL (ref 0–40)

## 2014-06-02 LAB — TSH: TSH: 1.2 u[IU]/mL (ref 0.350–4.500)

## 2014-06-02 LAB — GLUCOSE, RANDOM: Glucose, Bld: 79 mg/dL (ref 70–99)

## 2014-06-03 LAB — URINE CULTURE
Colony Count: NO GROWTH
Organism ID, Bacteria: NO GROWTH

## 2014-06-23 ENCOUNTER — Ambulatory Visit (INDEPENDENT_AMBULATORY_CARE_PROVIDER_SITE_OTHER): Payer: 59 | Admitting: Gynecology

## 2014-06-23 ENCOUNTER — Encounter: Payer: Self-pay | Admitting: Gynecology

## 2014-06-23 VITALS — BP 120/70

## 2014-06-23 DIAGNOSIS — N762 Acute vulvitis: Secondary | ICD-10-CM

## 2014-06-23 LAB — URINALYSIS W MICROSCOPIC + REFLEX CULTURE
Bilirubin Urine: NEGATIVE
Glucose, UA: NEGATIVE mg/dL
Hgb urine dipstick: NEGATIVE
KETONES UR: NEGATIVE mg/dL
Leukocytes, UA: NEGATIVE
Nitrite: NEGATIVE
PH: 7 (ref 5.0–8.0)
Protein, ur: 30 mg/dL — AB
SPECIFIC GRAVITY, URINE: 1.025 (ref 1.005–1.030)
Urobilinogen, UA: 0.2 mg/dL (ref 0.0–1.0)

## 2014-06-23 LAB — WET PREP FOR TRICH, YEAST, CLUE
CLUE CELLS WET PREP: NONE SEEN
Trich, Wet Prep: NONE SEEN

## 2014-06-23 MED ORDER — TERCONAZOLE 0.4 % VA CREA: 1.0000 | TOPICAL_CREAM | Freq: Every day | VAGINAL | Status: DC

## 2014-06-23 NOTE — Progress Notes (Signed)
Etter SjogrenBetsy L Lortie July 09, 1985 [id number]        29 y.o.  G0P0 Presents with five-day history of vulvar irritation. Noticed some itching discomfort and felt she was getting an early yeast infection. Took 2 D[place]luc[place] pills [place] with. Had intercourse with a lot of discomfort and now notices a lot of stinging around the vaginal opening. No odor. Some frequency without dysuria urgency or low back pain. No fever or chills  Past medical history,surgical history, problem list, medications, allergies, family history and social history were all reviewed and documented in the EPIC chart.  Directed ROS with pertinent positives and negatives documented in the history of present illness/assessment and plan.  Exam: Kim assistant Filed Vitals:   06/23/14 0919  BP: 120/70   General appearance:  Normal Abdomen soft nontender without masses guarding rebound Pelvic external BUS vagina with 2 small ulcerative areas posterior fourchette. HSV screen done. Cakey white discharge. Cervix normal. Uterus normal size midline mobile nontender. Adnexa without masses or tenderness.  Assessment/Plan:  29 y.o. G0P0 with history and exam as above. HSV screen done although I think this is more traumatic from intercourse and having yeast inflammation. Patient will follow up with us screen. Wet prep is positive for yeast. Urinalysis is negative. We'll treat with Terazol 7 day cream. She does have a history of recurrent yeast infections but admits to not changing out of her exercise clothing all day which she is going to start doing. Reviewed boric acid suppository suppression and she wants to try this.  After the Terazol she'll start boric acid suppositories 600 mg twice weekly #30 one refill.  Follow up as needed.    Dara LordsFONTAINE,Tokiko Diefenderfer P MD, 9:51 AM 06/23/2014

## 2014-06-23 NOTE — Patient Instructions (Signed)
Use the vaginal yeast cream nightly for 7 days. Start the boric acid suppositories which [facility]

## 2014-06-23 NOTE — Addendum Note (Signed)
Addended by: Dayna BarkerGARDNER, KIMBERLY K on: 06/23/2014 12:00 PM   Modules accepted: Orders

## 2014-06-26 LAB — HERPES SIMPLEX VIRUS CULTURE: Organism ID, Bacteria: DETECTED

## 2014-06-27 ENCOUNTER — Encounter: Payer: Self-pay | Admitting: Gynecology

## 2014-06-30 ENCOUNTER — Other Ambulatory Visit: Payer: Self-pay | Admitting: Gynecology

## 2014-06-30 MED ORDER — VALACYCLOVIR HCL 500 MG PO TABS: 500.0000 mg | ORAL_TABLET | Freq: Two times a day (BID) | ORAL | Status: DC

## 2014-06-30 MED ORDER — VALACYCLOVIR HCL 500 MG PO TABS: 500.0000 mg | ORAL_TABLET | Freq: Every day | ORAL | Status: DC

## 2014-06-30 NOTE — Addendum Note (Signed)
Addended by: Aura CampsWEBB, JENNIFER L on: 06/30/2014 02:49 PM   Modules accepted: Orders

## 2014-08-02 ENCOUNTER — Telehealth: Payer: Self-pay | Admitting: *Deleted

## 2014-08-02 MED ORDER — ALPRAZOLAM 0.25 MG PO TABS: 0.2500 mg | ORAL_TABLET | Freq: Every evening | ORAL | Status: DC | PRN

## 2014-08-02 NOTE — Telephone Encounter (Signed)
Pt called c/o xanax 0.25 mg tablet #30 last filled in dec. 2015. Please advise

## 2014-08-02 NOTE — Telephone Encounter (Signed)
Okay refill 1

## 2014-08-02 NOTE — Telephone Encounter (Signed)
rx called in, pt aware 

## 2014-11-07 ENCOUNTER — Ambulatory Visit (INDEPENDENT_AMBULATORY_CARE_PROVIDER_SITE_OTHER): Payer: 59 | Admitting: Women's Health

## 2014-11-07 ENCOUNTER — Encounter: Payer: Self-pay | Admitting: Women's Health

## 2014-11-07 VITALS — BP 120/76

## 2014-11-07 DIAGNOSIS — B373 Candidiasis of vulva and vagina: Secondary | ICD-10-CM | POA: Diagnosis not present

## 2014-11-07 DIAGNOSIS — R5383 Other fatigue: Secondary | ICD-10-CM | POA: Diagnosis not present

## 2014-11-07 DIAGNOSIS — N912 Amenorrhea, unspecified: Secondary | ICD-10-CM | POA: Diagnosis not present

## 2014-11-07 DIAGNOSIS — B3731 Acute candidiasis of vulva and vagina: Secondary | ICD-10-CM

## 2014-11-07 DIAGNOSIS — N898 Other specified noninflammatory disorders of vagina: Secondary | ICD-10-CM | POA: Diagnosis not present

## 2014-11-07 LAB — CBC WITH DIFFERENTIAL/PLATELET
Basophils Absolute: 0 10*3/uL (ref 0.0–0.1)
Basophils Relative: 0 % (ref 0–1)
EOS PCT: 0 % (ref 0–5)
Eosinophils Absolute: 0 10*3/uL (ref 0.0–0.7)
HCT: 39 % (ref 36.0–46.0)
Hemoglobin: 13.2 g/dL (ref 12.0–15.0)
LYMPHS ABS: 2.4 10*3/uL (ref 0.7–4.0)
Lymphocytes Relative: 31 % (ref 12–46)
MCH: 29.7 pg (ref 26.0–34.0)
MCHC: 33.8 g/dL (ref 30.0–36.0)
MCV: 87.6 fL (ref 78.0–100.0)
MONO ABS: 0.6 10*3/uL (ref 0.1–1.0)
MONOS PCT: 8 % (ref 3–12)
MPV: 10.3 fL (ref 8.6–12.4)
Neutro Abs: 4.6 10*3/uL (ref 1.7–7.7)
Neutrophils Relative %: 61 % (ref 43–77)
PLATELETS: 253 10*3/uL (ref 150–400)
RBC: 4.45 MIL/uL (ref 3.87–5.11)
RDW: 12.9 % (ref 11.5–15.5)
WBC: 7.6 10*3/uL (ref 4.0–10.5)

## 2014-11-07 LAB — WET PREP FOR TRICH, YEAST, CLUE
CLUE CELLS WET PREP: NONE SEEN
TRICH WET PREP: NONE SEEN
YEAST WET PREP: NONE SEEN

## 2014-11-07 LAB — PREGNANCY, URINE: PREG TEST UR: NEGATIVE

## 2014-11-07 LAB — TSH: TSH: 1.438 u[IU]/mL (ref 0.350–4.500)

## 2014-11-07 NOTE — Patient Instructions (Signed)

## 2014-11-07 NOTE — Progress Notes (Signed)
Patient ID: Catherine Mitchell, female   DOB: Jun 14, 1985, 29 y.o.   MRN: [id number] Presents with positive home urine pregnancy test. ParaGard IUD 04/2013, monthly cycles/same partner.  Unable to feel strings of IUD ever. Cycle is a couple days late,  also been experiencing extreme fatigue. Currently taking carbamazepine for bipolar disorder.  Exam: Appears well, urine pregnancy test negative.  External genitalia normal. Speculum exam reveals normal vaginal walls and cervix with  ParaGard string visible. Wet Prep: Negative .   False positive home pregnancy test  Plan:  Check quantitative HCG, TSH, and CBC.  Discussed current medications for bipolar disease are category D, pregnancy safety reviewed, reviewed discussing change with psychiatrist prior to conceiving.   Call if no cycle in the next month.

## 2014-11-08 LAB — HCG, SERUM, QUALITATIVE: Preg, Serum: NEGATIVE

## 2015-03-13 ENCOUNTER — Encounter: Payer: Self-pay | Admitting: Podiatry

## 2015-03-13 ENCOUNTER — Ambulatory Visit (INDEPENDENT_AMBULATORY_CARE_PROVIDER_SITE_OTHER): Payer: BLUE CROSS/BLUE SHIELD

## 2015-03-13 ENCOUNTER — Ambulatory Visit (INDEPENDENT_AMBULATORY_CARE_PROVIDER_SITE_OTHER): Payer: BLUE CROSS/BLUE SHIELD | Admitting: Podiatry

## 2015-03-13 ENCOUNTER — Ambulatory Visit: Payer: BLUE CROSS/BLUE SHIELD

## 2015-03-13 VITALS — BP 105/53 | HR 63 | Resp 18

## 2015-03-13 DIAGNOSIS — B351 Tinea unguium: Secondary | ICD-10-CM

## 2015-03-13 DIAGNOSIS — R52 Pain, unspecified: Secondary | ICD-10-CM

## 2015-03-13 DIAGNOSIS — M205X1 Other deformities of toe(s) (acquired), right foot: Secondary | ICD-10-CM | POA: Diagnosis not present

## 2015-03-13 NOTE — Progress Notes (Signed)
   Subjective:    Patient ID: Catherine Mitchell, female    DOB: 05-Jul-1985, 30 y.o.   MRN: [id number]  HPI  30 year old female presents to the office today for pain to her right big toe and the left 2nd and 4th toenails fall off. The nails have been coming off for for about 4-5 months and painful when the nails fall off. No redness or swelling. No drainage. No injury.   Right big toe has bee hurting for about 3 months. No injury or trauma. Pain is worse when trying to bend it. When she wears flat shoes, it hurts more. When she wears tennis shoes it is not as bad. No tingling or numbness. No previous treatment.   Review of Systems  All other systems reviewed and are negative.      Objective:   Physical Exam General: AAO x3, NAD  Dermatological:  The nails appear to be somewhat dystrophic, discolored, brittle particular the nails on the left foot on  The fourth toe. There is no present nail in the left second toe. There is no swelling erythema or drainage. There are no other open lesions or pre-ulcerative lesions.  Vascular: Dorsalis Pedis artery and Posterior Tibial artery pedal pulses are 2/4 bilateral with immedate capillary fill time. Pedal hair growth present. No varicosities and no lower extremity edema present bilateral. There is no pain with calf compression, swelling, warmth, erythema.   Neruologic: Grossly intact via light touch bilateral. Vibratory intact via tuning fork bilateral. Protective threshold with Semmes Wienstein monofilament intact to all pedal sites bilateral. Patellar and Achilles deep tendon reflexes 2+ bilateral. No Babinski or clonus noted bilateral.   Musculoskeletal:  There is decreased range of motion of the  First MTPJ on the right side and dorsiflexion. There is no other area pinpoint bony tenderness or pain the vibratory sensation. MMT 5/5, ROM WNL  Gait: Unassisted, Nonantalgic.      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  There is slight elevation the first metatarsal. No definitive evidence of acute fracture stress fracture. -The left fourth toenail was biopsied and sent to [facility] for evaluation of onychomycosis and onychodystrophy. -Discuss etiologies of the right first MTPJ, hallux limitus. I discussed orthotics and shoe gear modifications. This point she would try some changes in her shoes before proceeding with orthotics. -Follow-up after nail culture or sooner if any problems arise. In the meantime, encouraged to call the office with any questions, concerns, change in symptoms.   Ovid Curd, DPM

## 2015-03-23 ENCOUNTER — Ambulatory Visit: Payer: BLUE CROSS/BLUE SHIELD | Admitting: Podiatry

## 2015-04-04 ENCOUNTER — Telehealth: Payer: Self-pay | Admitting: *Deleted

## 2015-04-04 NOTE — Telephone Encounter (Signed)
Patient stopped by on 03-29-15 to find out if the nail culture was back and relied the results to the patient (negative for fungus) can try urea. Misty Stanley

## 2015-05-14 ENCOUNTER — Telehealth: Payer: Self-pay | Admitting: *Deleted

## 2015-05-14 MED ORDER — ALPRAZOLAM 0.25 MG PO TABS: 0.2500 mg | ORAL_TABLET | Freq: Every evening | ORAL | Status: DC | PRN

## 2015-05-14 NOTE — Telephone Encounter (Signed)
Pt scheduled for annual on 06/04/15  Rx called in.

## 2015-05-14 NOTE — Telephone Encounter (Signed)
Pt called requesting refill Xanax 0.25 mg last filled on June 2016 with no refills. Please advise

## 2015-05-14 NOTE — Telephone Encounter (Signed)
Okay to refill. She should be coming due on annual exam

## 2015-06-04 ENCOUNTER — Ambulatory Visit (INDEPENDENT_AMBULATORY_CARE_PROVIDER_SITE_OTHER): Payer: BLUE CROSS/BLUE SHIELD | Admitting: Women's Health

## 2015-06-04 ENCOUNTER — Encounter: Payer: Self-pay | Admitting: Women's Health

## 2015-06-04 VITALS — BP 124/80 | Ht 68.0 in | Wt 247.0 lb

## 2015-06-04 DIAGNOSIS — Z01419 Encounter for gynecological examination (general) (routine) without abnormal findings: Secondary | ICD-10-CM | POA: Diagnosis not present

## 2015-06-04 DIAGNOSIS — Z113 Encounter for screening for infections with a predominantly sexual mode of transmission: Secondary | ICD-10-CM | POA: Diagnosis not present

## 2015-06-04 DIAGNOSIS — N898 Other specified noninflammatory disorders of vagina: Secondary | ICD-10-CM | POA: Diagnosis not present

## 2015-06-04 LAB — WET PREP FOR TRICH, YEAST, CLUE
CLUE CELLS WET PREP: NONE SEEN
TRICH WET PREP: NONE SEEN
WBC, Wet Prep HPF POC: NONE SEEN
Yeast Wet Prep HPF POC: NONE SEEN

## 2015-06-04 NOTE — Patient Instructions (Addendum)
Health Maintenance, Female Adopting a healthy lifestyle and getting preventive care can go a long way to promote health and wellness. Talk with your health care provider about what schedule of regular examinations is right for you. This is a good chance for you to check in with your provider about disease prevention and staying healthy. In between checkups, there are plenty of things you can do on your own. Experts have done a lot of research about which lifestyle changes and preventive measures are most likely to keep you healthy. Ask your health care provider for more information. WEIGHT AND DIET  Eat a healthy diet  Be sure to include plenty of vegetables, fruits, low-fat dairy products, and lean protein.  Do not eat a lot of foods high in solid fats, added sugars, or salt.  Get regular exercise. This is one of the most important things you can do for your health.  Most adults should exercise for at least 150 minutes each week. The exercise should increase your heart rate and make you sweat (moderate-intensity exercise).  Most adults should also do strengthening exercises at least twice a week. This is in addition to the moderate-intensity exercise.  Maintain a healthy weight  Body mass index (BMI) is a measurement that can be used to identify possible weight problems. It estimates body fat based on height and weight. Your health care provider can help determine your BMI and help you achieve or maintain a healthy weight.  For females 20 years of age and older:   A BMI below 18.5 is considered underweight.  A BMI of 18.5 to 24.9 is normal.  A BMI of 25 to 29.9 is considered overweight.  A BMI of 30 and above is considered obese.  Watch levels of cholesterol and blood lipids  You should start having your blood tested for lipids and cholesterol at 29 years of age, then have this test every 5 years.  You may need to have your cholesterol levels checked more often if:  Your lipid  or cholesterol levels are high.  You are older than 29 years of age.  You are at high risk for heart disease.  CANCER SCREENING   Lung Cancer  Lung cancer screening is recommended for adults 55-80 years old who are at high risk for lung cancer because of a history of smoking.  A yearly low-dose CT scan of the lungs is recommended for people who:  Currently smoke.  Have quit within the past 15 years.  Have at least a 30-pack-year history of smoking. A pack year is smoking an average of one pack of cigarettes a day for 1 year.  Yearly screening should continue until it has been 15 years since you quit.  Yearly screening should stop if you develop a health problem that would prevent you from having lung cancer treatment.  Breast Cancer  Practice breast self-awareness. This means understanding how your breasts normally appear and feel.  It also means doing regular breast self-exams. Let your health care provider know about any changes, no matter how small.  If you are in your 20s or 30s, you should have a clinical breast exam (CBE) by a health care provider every 1-3 years as part of a regular health exam.  If you are 40 or older, have a CBE every year. Also consider having a breast X-ray (mammogram) every year.  If you have a family history of breast cancer, talk to your health care provider about genetic screening.  If you   are at high risk for breast cancer, talk to your health care provider about having an MRI and a mammogram every year.  Breast cancer gene (BRCA) assessment is recommended for women who have family members with BRCA-related cancers. BRCA-related cancers include:  Breast.  Ovarian.  Tubal.  Peritoneal cancers.  Results of the assessment will determine the need for genetic counseling and BRCA1 and BRCA2 testing. Cervical Cancer Your health care provider may recommend that you be screened regularly for cancer of the pelvic organs (ovaries, uterus, and  vagina). This screening involves a pelvic examination, including checking for microscopic changes to the surface of your cervix (Pap test). You may be encouraged to have this screening done every 3 years, beginning at age 21.  For women ages 30-65, health care providers may recommend pelvic exams and Pap testing every 3 years, or they may recommend the Pap and pelvic exam, combined with testing for human papilloma virus (HPV), every 5 years. Some types of HPV increase your risk of cervical cancer. Testing for HPV may also be done on women of any age with unclear Pap test results.  Other health care providers may not recommend any screening for nonpregnant women who are considered low risk for pelvic cancer and who do not have symptoms. Ask your health care provider if a screening pelvic exam is right for you.  If you have had past treatment for cervical cancer or a condition that could lead to cancer, you need Pap tests and screening for cancer for at least 20 years after your treatment. If Pap tests have been discontinued, your risk factors (such as having a new sexual partner) need to be reassessed to determine if screening should resume. Some women have medical problems that increase the chance of getting cervical cancer. In these cases, your health care provider may recommend more frequent screening and Pap tests. Colorectal Cancer  This type of cancer can be detected and often prevented.  Routine colorectal cancer screening usually begins at 29 years of age and continues through 29 years of age.  Your health care provider may recommend screening at an earlier age if you have risk factors for colon cancer.  Your health care provider may also recommend using home test kits to check for hidden blood in the stool.  A small camera at the end of a tube can be used to examine your colon directly (sigmoidoscopy or colonoscopy). This is done to check for the earliest forms of colorectal  cancer.  Routine screening usually begins at age 50.  Direct examination of the colon should be repeated every 5-10 years through 29 years of age. However, you may need to be screened more often if early forms of precancerous polyps or small growths are found. Skin Cancer  Check your skin from head to toe regularly.  Tell your health care provider about any new moles or changes in moles, especially if there is a change in a mole's shape or color.  Also tell your health care provider if you have a mole that is larger than the size of a pencil eraser.  Always use sunscreen. Apply sunscreen liberally and repeatedly throughout the day.  Protect yourself by wearing long sleeves, pants, a wide-brimmed hat, and sunglasses whenever you are outside. HEART DISEASE, DIABETES, AND HIGH BLOOD PRESSURE   High blood pressure causes heart disease and increases the risk of stroke. High blood pressure is more likely to develop in:  People who have blood pressure in the high end   of the normal range (130-139/85-89 mm Hg).  People who are overweight or obese.  People who are African American.  If you are 38-23 years of age, have your blood pressure checked every 3-5 years. If you are 61 years of age or older, have your blood pressure checked every year. You should have your blood pressure measured twice--once when you are at a hospital or clinic, and once when you are not at a hospital or clinic. Record the average of the two measurements. To check your blood pressure when you are not at a hospital or clinic, you can use:  An automated blood pressure machine at a pharmacy.  A home blood pressure monitor.  If you are between 45 years and 39 years old, ask your health care provider if you should take aspirin to prevent strokes.  Have regular diabetes screenings. This involves taking a blood sample to check your fasting blood sugar level.  If you are at a normal weight and have a low risk for diabetes,  have this test once every three years after 29 years of age.  If you are overweight and have a high risk for diabetes, consider being tested at a younger age or more often. PREVENTING INFECTION  Hepatitis B  If you have a higher risk for hepatitis B, you should be screened for this virus. You are considered at high risk for hepatitis B if:  You were born in a country where hepatitis B is common. Ask your health care provider which countries are considered high risk.  Your parents were born in a high-risk country, and you have not been immunized against hepatitis B (hepatitis B vaccine).  You have HIV or AIDS.  You use needles to inject street drugs.  You live with someone who has hepatitis B.  You have had sex with someone who has hepatitis B.  You get hemodialysis treatment.  You take certain medicines for conditions, including cancer, organ transplantation, and autoimmune conditions. Hepatitis C  Blood testing is recommended for:  Everyone born from [birth year] through 1964.  Anyone with known risk factors for hepatitis C. Sexually transmitted infections (STIs)  You should be screened for sexually transmitted infections (STIs) including gonorrhea and chlamydia if:  You are sexually active and are younger than 29 years of age.  You are older than 29 years of age and your health care provider tells you that you are at risk for this type of infection.  Your sexual activity has changed since you were last screened and you are at an increased risk for chlamydia or gonorrhea. Ask your health care provider if you are at risk.  If you do not have HIV, but are at risk, it may be recommended that you take a prescription medicine daily to prevent HIV infection. This is called pre-exposure prophylaxis (PrEP). You are considered at risk if:  You are sexually active and do not regularly use condoms or know the HIV status of your partner(s).  You take drugs by injection.  You are sexually  active with a partner who has HIV. Talk with your health care provider about whether you are at high risk of being infected with HIV. If you choose to begin PrEP, you should first be tested for HIV. You should then be tested every 3 months for as long as you are taking PrEP.  PREGNANCY   If you are premenopausal and you may become pregnant, ask your health care provider about preconception counseling.  If you may  become pregnant, take 400 to 800 micrograms (mcg) of folic acid every day.  If you want to prevent pregnancy, talk to your health care provider about birth control (contraception). OSTEOPOROSIS AND MENOPAUSE   Osteoporosis is a disease in which the bones lose minerals and strength with aging. This can result in serious bone fractures. Your risk for osteoporosis can be identified using a bone density scan.  If you are 42 years of age or older, or if you are at risk for osteoporosis and fractures, ask your health care provider if you should be screened.  Ask your health care provider whether you should take a calcium or vitamin D supplement to lower your risk for osteoporosis.  Menopause may have certain physical symptoms and risks.  Hormone replacement therapy may reduce some of these symptoms and risks. Talk to your health care provider about whether hormone replacement therapy is right for you.  HOME CARE INSTRUCTIONS   Schedule regular health, dental, and eye exams.  Stay current with your immunizations.   Do not use any tobacco products including cigarettes, chewing tobacco, or electronic cigarettes.  If you are pregnant, do not drink alcohol.  If you are breastfeeding, limit how much and how often you drink alcohol.  Limit alcohol intake to no more than 1 drink per day for nonpregnant women. One drink equals 12 ounces of beer, 5 ounces of wine, or 1 ounces of hard liquor.  Do not use street drugs.  Do not share needles.  Ask your health care provider for help if  you need support or information about quitting drugs.  Tell your health care provider if you often feel depressed.  Tell your health care provider if you have ever been abused or do not feel safe at home.   This information is not intended to replace advice given to you by your health care provider. Make sure you discuss any questions you have with your health care provider.   Document Released: 08/05/2010 Document Revised: 02/10/2014 Document Reviewed: 12/22/2012 Elsevier Interactive Patient Education 2016 [place] Carbohydrate Counting for Diabetes Mellitus Carbohydrate counting is a method for keeping track of the amount of carbohydrates you eat. Eating carbohydrates naturally increases the level of sugar (glucose) in your blood, so it is important for you to know the amount that is okay for you to have in every meal. Carbohydrate counting helps keep the level of glucose in your blood within normal limits. The amount of carbohydrates allowed is different for every person. A dietitian can help you calculate the amount that is right for you. Once you know the amount of carbohydrates you can have, you can count the carbohydrates in the foods you want to eat. Carbohydrates are found in the following foods:  Grains, such as breads and cereals.  Dried beans and soy products.  Starchy vegetables, such as potatoes, peas, and corn.  Fruit and fruit juices.  Milk and yogurt.  Sweets and snack foods, such as cake, cookies, candy, chips, soft drinks, and fruit drinks. CARBOHYDRATE COUNTING There are two ways to count the carbohydrates in your food. You can use either of the methods or a combination of both. Reading the "Nutrition Facts" on [place] The "Nutrition Facts" is an area that is included on the labels of almost all packaged food and beverages in the [place]. It includes the serving size of that food or beverage and information about the nutrients in each serving of  the food, including the grams (g)  of carbohydrate per serving.  Decide the number of servings of this food or beverage that you will be able to eat or drink. Multiply that number of servings by the number of grams of carbohydrate that is listed on the label for that serving. The total will be the amount of carbohydrates you will be having when you eat or drink this food or beverage. Learning Standard Serving Sizes of Food When you eat food that is not packaged or does not include "Nutrition Facts" on the label, you need to measure the servings in order to count the amount of carbohydrates.A serving of most carbohydrate-rich foods contains about 15 g of carbohydrates. The following list includes serving sizes of carbohydrate-rich foods that provide 15 g ofcarbohydrate per serving:   1 slice of bread (1 oz) or 1 six-inch tortilla.    of a hamburger bun or English muffin.  4-6 crackers.   cup unsweetened dry cereal.    cup hot cereal.   cup rice or pasta.    cup mashed potatoes or  of a large baked potato.  1 cup fresh fruit or one small piece of fruit.    cup canned or frozen fruit or fruit juice.  1 cup milk.   cup plain fat-free yogurt or yogurt sweetened with artificial sweeteners.   cup cooked dried beans or starchy vegetable, such as peas, corn, or potatoes.  Decide the number of standard-size servings that you will eat. Multiply that number of servings by 15 (the grams of carbohydrates in that serving). For example, if you eat 2 cups of strawberries, you will have eaten 2 servings and 30 g of carbohydrates (2 servings x 15 g = 30 g). For foods such as soups and casseroles, in which more than one food is mixed in, you will need to count the carbohydrates in each food that is included. EXAMPLE OF CARBOHYDRATE COUNTING Sample Dinner  3 oz chicken breast.   cup of brown rice.   cup of corn.  1 cup milk.   1 cup strawberries with sugar-free whipped topping.   Carbohydrate Calculation Step 1: Identify the foods that contain carbohydrates:   Rice.   Corn.   Milk.   Strawberries. Step 2:Calculate the number of servings eaten of each:   2 servings of rice.   1 serving of corn.   1 serving of milk.   1 serving of strawberries. Step 3: Multiply each of those number of servings by 15 g:   2 servings of rice x 15 g = 30 g.   1 serving of corn x 15 g = 15 g.   1 serving of milk x 15 g = 15 g.   1 serving of strawberries x 15 g = 15 g. Step 4: Add together all of the amounts to find the total grams of carbohydrates eaten: 30 g + 15 g + 15 g + 15 g = 75 g.   This information is not intended to replace advice given to you by your health care provider. Make sure you discuss any questions you have with your health care provider.   Document Released: 01/20/2005 Document Revised: 02/10/2014 Document Reviewed: 12/17/2012 Elsevier Interactive Patient Education [employer].

## 2015-06-04 NOTE — Progress Notes (Signed)
Etter SjogrenBetsy L Brocious 21-Nov-1985 [id number]    History:    Presents for annual exam.  04/2013  ParaGard IUD with monthly cycle. Normal Pap history. History of bipolar disease primary care manages. 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 HSV 1. Same partner with negative STD screen, requests GC/Chlamydia only. History of recurrent yeast -resolved boric acid in the past with good relief. Gardasil completed.  Past medical history, past surgical history, family history and social history were all reviewed and documented in the EPIC chart. Working full time and taking online classes through [employer] for psychology/counseling. Poor relationship with mother, history of drug abuse.  ROS:  A ROS was performed and pertinent positives and negatives are included.  Exam:  Filed Vitals:   06/04/15 1553  BP: 124/80    General appearance:  Normal Thyroid:  Symmetrical, normal in size, without palpable masses or nodularity. Respiratory  Auscultation:  Clear without wheezing or rhonchi Cardiovascular  Auscultation:  Regular rate, without rubs, murmurs or gallops  Edema/varicosities:  Not grossly evident Abdominal  Soft,nontender, without masses, guarding or rebound.  Liver/spleen:  No organomegaly noted  Hernia:  None appreciated  Skin  Inspection:  Grossly normal   Breasts: Examined lying and sitting.     Right: Without masses, retractions, discharge or axillary adenopathy.     Left: Without masses, retractions, discharge or axillary adenopathy. Gentitourinary   Inguinal/mons:  Normal without inguinal adenopathy  External genitalia:  Normal  BUS/Urethra/Skene's glands:  Normal  Vagina:  Normal Wet prep negative  Cervix:  Normal IUD strings visible  Uterus:  normal in size, shape and contour.  Midline and mobile  Adnexa/parametria:     Rt: Without masses or tenderness.   Lt: Without masses or tenderness.  Anus and perineum: Normal  Digital rectal  exam: Normal sphincter tone without palpated masses or tenderness  Assessment/Plan:  30 y.o. S WF G0 for annual exam.    04/2013 ParaGard IUD Bipolar primary care manages labs and meds and counseling History of recurrent yeast Jenna Luo/resolved /good relief with boric acid HSV-1 with rare outbreaks  Obesity  Plan: Reviewed normality of wet prep.  SBE's, exercise, calcium rich diet, MVI daily encouraged. Continue to work on increasing exercise and decreasing calories/carbs for weight loss. UA, Pap normal 2015, screening guidelines reviewed. GC/Chlamydia, declines need for HIV, hepatitis and RPR.    Harrington ChallengerYOUNG,NANCY J [facility] Wilson TerraceWHNP, 4:41 PM 06/04/2015

## 2015-06-05 LAB — URINALYSIS W MICROSCOPIC + REFLEX CULTURE
BILIRUBIN URINE: NEGATIVE
CRYSTALS: NONE SEEN [HPF]
Casts: NONE SEEN [LPF]
GLUCOSE, UA: NEGATIVE
Hgb urine dipstick: NEGATIVE
KETONES UR: NEGATIVE
Nitrite: NEGATIVE
Protein, ur: NEGATIVE
RBC / HPF: NONE SEEN RBC/HPF (ref <=2)
SPECIFIC GRAVITY, URINE: 1.02 (ref 1.001–1.035)
Yeast: NONE SEEN [HPF]
pH: 6 (ref 5.0–8.0)

## 2015-06-05 LAB — GC/CHLAMYDIA PROBE AMP
CT Probe RNA: NOT DETECTED
GC Probe RNA: NOT DETECTED

## 2015-06-06 LAB — URINE CULTURE: Colony Count: 60000

## 2015-07-11 ENCOUNTER — Telehealth: Payer: Self-pay | Admitting: *Deleted

## 2015-07-11 DIAGNOSIS — Z1329 Encounter for screening for other suspected endocrine disorder: Secondary | ICD-10-CM

## 2015-07-11 NOTE — Telephone Encounter (Signed)
Pt called c/o sweating a lot even when its not hot, pt said this has been going on for about 6 months now. She didn't mention this to you annual in May, asked if her hormone levels could be checked? Please advise

## 2015-07-12 ENCOUNTER — Other Ambulatory Visit: Payer: BLUE CROSS/BLUE SHIELD

## 2015-07-12 DIAGNOSIS — Z1329 Encounter for screening for other suspected endocrine disorder: Secondary | ICD-10-CM

## 2015-07-12 LAB — TSH: TSH: 1.25 mIU/L

## 2015-07-12 NOTE — Telephone Encounter (Signed)
Okay, have her come in and check a TSH she goes by Catherine Mitchell not Catherine Mitchell.

## 2015-07-12 NOTE — Telephone Encounter (Signed)
Pt aware coming today at 11:00am, order placed

## 2015-09-14 ENCOUNTER — Ambulatory Visit (INDEPENDENT_AMBULATORY_CARE_PROVIDER_SITE_OTHER): Payer: BLUE CROSS/BLUE SHIELD | Admitting: Urology

## 2015-09-14 DIAGNOSIS — R601 Generalized edema: Secondary | ICD-10-CM

## 2015-09-14 DIAGNOSIS — N3281 Overactive bladder: Secondary | ICD-10-CM | POA: Diagnosis not present

## 2015-09-19 ENCOUNTER — Other Ambulatory Visit: Payer: Self-pay | Admitting: *Deleted

## 2015-09-19 MED ORDER — ALPRAZOLAM 0.25 MG PO TABS: 0.2500 mg | ORAL_TABLET | Freq: Every evening | ORAL | 1 refills | Status: DC | PRN

## 2015-09-19 NOTE — Telephone Encounter (Signed)
Pt called requesting refill on Xanax 0.25 mg , last filled on 05/14/15, takes as needed. Annual was in May with nancy. Please advise

## 2015-09-19 NOTE — Telephone Encounter (Signed)
Rx called in 

## 2015-10-04 ENCOUNTER — Telehealth: Payer: Self-pay | Admitting: Nurse Practitioner

## 2015-11-22 NOTE — Telephone Encounter (Signed)
Patient called.

## 2015-12-17 ENCOUNTER — Ambulatory Visit (INDEPENDENT_AMBULATORY_CARE_PROVIDER_SITE_OTHER): Payer: BLUE CROSS/BLUE SHIELD | Admitting: Gynecology

## 2015-12-17 ENCOUNTER — Telehealth: Payer: Self-pay | Admitting: *Deleted

## 2015-12-17 ENCOUNTER — Encounter: Payer: Self-pay | Admitting: Gynecology

## 2015-12-17 VITALS — BP 116/74

## 2015-12-17 DIAGNOSIS — N898 Other specified noninflammatory disorders of vagina: Secondary | ICD-10-CM | POA: Diagnosis not present

## 2015-12-17 DIAGNOSIS — R300 Dysuria: Secondary | ICD-10-CM

## 2015-12-17 LAB — URINALYSIS W MICROSCOPIC + REFLEX CULTURE
Bilirubin Urine: NEGATIVE
CASTS: NONE SEEN [LPF]
Crystals: NONE SEEN [HPF]
Glucose, UA: NEGATIVE
Ketones, ur: NEGATIVE
NITRITE: NEGATIVE
PH: 6.5 (ref 5.0–8.0)
Protein, ur: NEGATIVE
SPECIFIC GRAVITY, URINE: 1.02 (ref 1.001–1.035)
YEAST: NONE SEEN [HPF]

## 2015-12-17 LAB — WET PREP FOR TRICH, YEAST, CLUE
CLUE CELLS WET PREP: NONE SEEN
Trich, Wet Prep: NONE SEEN

## 2015-12-17 MED ORDER — CIPROFLOXACIN HCL 250 MG PO TABS: 250.0000 mg | ORAL_TABLET | Freq: Two times a day (BID) | ORAL | 0 refills | Status: DC

## 2015-12-17 MED ORDER — FLUCONAZOLE 150 MG PO TABS: 150.0000 mg | ORAL_TABLET | Freq: Once | ORAL | 0 refills | Status: AC

## 2015-12-17 MED ORDER — NONFORMULARY OR COMPOUNDED ITEM: 1 refills | Status: DC

## 2015-12-17 NOTE — Telephone Encounter (Signed)
Rx called in 

## 2015-12-17 NOTE — Addendum Note (Signed)
Addended by: Dayna BarkerGARDNER, Lakin Romer K on: 12/17/2015 02:24 PM   Modules accepted: Orders

## 2015-12-17 NOTE — Progress Notes (Signed)
    [name] 13-Jan-1986 [id number]        30 y.o.  G0P0 presents with several days of vulvar irritation with rash. Was unsure whether it was from wearing a pad with her last period and irritating.  As noted some discharge but no significant itching. Is placed Desitin on the outside which seems to make it feel better. Also notes some urinary frequency with mild dysuria and lower pelvic aching. Wonders whether she is getting and a UTI. No fever or chills. No low back pain diarrhea constipation nausea or vomiting.  Past medical history,surgical history, problem list, medications, allergies, family history and social history were all reviewed and documented in the EPIC chart.  Directed ROS with pertinent positives and negatives documented in the history of present illness/assessment and plan.  Exam: Kennon PortelaKim Gardner assistant Vitals:   12/17/15 1156  BP: 116/74   General appearance:  Normal Spine straight without CVA tenderness Abdomen soft nontender without masses guarding rebound Pelvic external BUS vagina with frothy white discharge. Cervix normal. IUD string visualized.  Uterus grossly normal size midline mobile nontender. Adnexa without masses or tenderness.  Assessment/Plan:  30 y.o. G0P0 with history and exam as above. Urinalysis consistent with UTI. Wet prep consistent with yeast. Will treat with ciprofloxacin 250 mg twice a day 3 days and Diflucan 150 mg 1 at the beginning and one at the end of the antibiotic course. She does use boric acid suppositories intermittently which seemed to keep away yeast infections. She asked if she could have a refill. Boric acid suppositories 600 mg #30 with one [facility]ill [facility]. Follow up if symptoms persist, worsen or recur.    Dara LordsFONTAINE,Rillie Riffel P MD, 12:05 PM 12/17/2015

## 2015-12-17 NOTE — Telephone Encounter (Signed)
-----   Message from Dara Lordsimothy P Fontaine, MD sent at 12/17/2015 12:28 PM EST ----- [facility] boric acid 600 mg vaginal suppositories #30 with 1 refill 1 per vagina twice weekly to prevent yeast infection

## 2015-12-17 NOTE — Patient Instructions (Signed)
Take the antibiotic twice daily for 3 days for the urinary tract infection. Take the Diflucan pill 1 at the beginning of the antibiotic and one at the end of the antibiotic course.  Follow up if your symptoms persist, worsen or recur.

## 2015-12-19 LAB — URINE CULTURE

## 2015-12-21 ENCOUNTER — Ambulatory Visit: Payer: BLUE CROSS/BLUE SHIELD | Admitting: Urology

## 2015-12-26 ENCOUNTER — Telehealth: Payer: Self-pay

## 2015-12-26 ENCOUNTER — Encounter: Payer: Self-pay | Admitting: Gynecology

## 2015-12-26 ENCOUNTER — Other Ambulatory Visit: Payer: Self-pay | Admitting: Gynecology

## 2015-12-26 ENCOUNTER — Ambulatory Visit (INDEPENDENT_AMBULATORY_CARE_PROVIDER_SITE_OTHER): Payer: BLUE CROSS/BLUE SHIELD | Admitting: Gynecology

## 2015-12-26 VITALS — BP 126/76

## 2015-12-26 DIAGNOSIS — N390 Urinary tract infection, site not specified: Secondary | ICD-10-CM | POA: Diagnosis not present

## 2015-12-26 DIAGNOSIS — N898 Other specified noninflammatory disorders of vagina: Secondary | ICD-10-CM | POA: Diagnosis not present

## 2015-12-26 DIAGNOSIS — R300 Dysuria: Secondary | ICD-10-CM

## 2015-12-26 MED ORDER — NITROFURANTOIN MONOHYD MACRO 100 MG PO CAPS: 100.0000 mg | ORAL_CAPSULE | Freq: Two times a day (BID) | ORAL | 0 refills | Status: DC

## 2015-12-26 MED ORDER — FLUCONAZOLE 150 MG PO TABS: 150.0000 mg | ORAL_TABLET | Freq: Once | ORAL | 0 refills | Status: AC

## 2015-12-26 NOTE — Patient Instructions (Signed)
Take the antibiotic twice daily for 7 days. Take the Diflucan pill wanted to beginning M1 at the end of the antibiotic course.  Follow up to repeat your urine analysis in one month.  Follow up if your symptoms persist, worsen or recur.

## 2015-12-26 NOTE — Progress Notes (Signed)
    Catherine SjogrenBetsy L Mitchell [birth year] [id number]        30 y.o.  G0P0 presents complaining of stinging like dysuria with vaginal itching and discharge. Was seen last week and treated for UTI yeast with Diflucan 150 mg 2 doses and ciprofloxacin 250 mg twice daily for 3 days. Notes that her symptoms really have not improved. Culture grew out several organisms but no predominant organism. No fever or chills. No urgency or frequency. Does note some low back pain.  Past medical history,surgical history, problem list, medications, allergies, family history and social history were all reviewed and documented in the EPIC chart.  Directed ROS with pertinent positives and negatives documented in the history of present illness/assessment and plan.  Exam: Kennon PortelaKim Gardner assistant Vitals:   12/26/15 1550  BP: 126/76   General appearance:  Normal spine straight without CVA tenderness Abdomen soft nontender without masses guarding rebound Pelvic external BUS vagina with scant discharge. Cervix normal. Uterus grossly normal size midline mobile nontender. Adnexa without masses or tenderness.  Assessment/Plan:  30 y.o. G0P0 with persistent dysuria and vaginal itching with discharge. Wet prep is unimpressive. Urinalysis does show 10-20 RBCs with few bacteria no significant WBC. Is nowhere near her menses. Reviewed differential of hematuria to include continuing infection, possible lithiasis, interstitial cystitis, neoplasia. She did not have hematuria with her specimen last week. Will cover for UTI with Macrobid 100 mg twice a day 7 days Diflucan 150 mg at the beginning and one at the end of her antibiotic course and follow up urinalysis in one month. Patient knows for symptoms cleared for important to recheck her urinalysis to make sure the hematuria clears. If it would continue or if her symptoms do not improve then we'll have urology see her.    Dara LordsFONTAINE,TIMOTHY P MD, 4:10 PM 12/26/2015

## 2015-12-26 NOTE — Telephone Encounter (Signed)
Patient advised. She is working and does not think she can come over today. She is going to talk with her boss and will let me know what she can work out or not within 15 mins.

## 2015-12-26 NOTE — Telephone Encounter (Addendum)
Seen last week with UTI sx. She said her sx are pretty much the same. She is still burning with urination, frequency and her back is "bothering her".  Yeast infection is better but not completely resolved after Diflucan. Feels "bad". Feels chilled and like she has fever. What to rec?

## 2015-12-26 NOTE — Telephone Encounter (Signed)
Office visit

## 2015-12-26 NOTE — Telephone Encounter (Signed)
Patient called and said she worked it out with her boss and will come now. [place] is coming from [place] and will be here around 3:40pm.  Appt made. Order placed for u/a.

## 2015-12-28 LAB — URINE CULTURE: Organism ID, Bacteria: NO GROWTH

## 2015-12-31 LAB — URINALYSIS W MICROSCOPIC + REFLEX CULTURE
Bilirubin Urine: NEGATIVE
CRYSTALS: NONE SEEN [HPF]
Casts: NONE SEEN [LPF]
GLUCOSE, UA: NEGATIVE
Ketones, ur: NEGATIVE
LEUKOCYTES UA: NEGATIVE
Nitrite: NEGATIVE
PROTEIN: NEGATIVE
Specific Gravity, Urine: 1.02 (ref 1.001–1.035)
WBC UA: NONE SEEN WBC/HPF (ref <=5)
YEAST: NONE SEEN [HPF]
pH: 6.5 (ref 5.0–8.0)

## 2015-12-31 LAB — WET PREP FOR TRICH, YEAST, CLUE
Clue Cells Wet Prep HPF POC: NONE SEEN
Trich, Wet Prep: NONE SEEN
WBC, Wet Prep HPF POC: NONE SEEN
Yeast Wet Prep HPF POC: NONE SEEN

## 2016-02-05 ENCOUNTER — Telehealth: Payer: Self-pay | Admitting: *Deleted

## 2016-02-05 MED ORDER — FLUCONAZOLE 150 MG PO TABS: 150.0000 mg | ORAL_TABLET | Freq: Once | ORAL | 0 refills | Status: AC

## 2016-02-05 NOTE — Telephone Encounter (Signed)
Okay for Diflucan 150 mg 1 dose 

## 2016-02-05 NOTE — Telephone Encounter (Signed)
(  you are back up MD) Pt called c/o yeast infection itching and white discharge, asked if diflucan tablet could be sent to pharmacy. Was prescribed antibiotic by other provider. Please advise

## 2016-02-05 NOTE — Telephone Encounter (Signed)
Left on voicemail Rx has been sent. 

## 2016-02-18 ENCOUNTER — Telehealth: Payer: Self-pay | Admitting: *Deleted

## 2016-02-18 MED ORDER — FLUCONAZOLE 150 MG PO TABS: 150.0000 mg | ORAL_TABLET | Freq: Once | ORAL | 1 refills | Status: AC

## 2016-02-18 NOTE — Telephone Encounter (Signed)
Pt informed with the below note, Rx sent. 

## 2016-02-18 NOTE — Telephone Encounter (Signed)
Okay for Diflucan 150 mg 1 dose. Refill 1 in the event that the 1 dose does not take care of symptoms.

## 2016-02-18 NOTE — Telephone Encounter (Signed)
Pt called c/o yeast infection itching asked if she could be prescribed diflucan tablet 150 # 2, has been on antibiotic prescribed by another MD. Pt  Aware office visit for treatment. Please advise

## 2016-02-19 ENCOUNTER — Telehealth: Payer: Self-pay | Admitting: *Deleted

## 2016-04-21 ENCOUNTER — Telehealth: Payer: Self-pay | Admitting: *Deleted

## 2016-04-21 MED ORDER — ALPRAZOLAM 0.25 MG PO TABS: 0.2500 mg | ORAL_TABLET | Freq: Every evening | ORAL | 0 refills | Status: DC | PRN

## 2016-04-21 NOTE — Telephone Encounter (Signed)
Okay to refill 1 

## 2016-04-21 NOTE — Telephone Encounter (Signed)
Pt aware, Rx called in 

## 2016-04-21 NOTE — Telephone Encounter (Signed)
Pt called requesting refill on Xanax 0.025 mg last filled in 09/2015 with 1 refill. Pt annual due in May. Please advise

## 2016-06-09 ENCOUNTER — Encounter: Payer: BLUE CROSS/BLUE SHIELD | Admitting: Women's Health

## 2016-06-10 ENCOUNTER — Ambulatory Visit (INDEPENDENT_AMBULATORY_CARE_PROVIDER_SITE_OTHER): Payer: BLUE CROSS/BLUE SHIELD | Admitting: Women's Health

## 2016-06-10 ENCOUNTER — Encounter: Payer: Self-pay | Admitting: Women's Health

## 2016-06-10 VITALS — BP 130/80 | Ht 68.0 in | Wt 255.0 lb

## 2016-06-10 DIAGNOSIS — R635 Abnormal weight gain: Secondary | ICD-10-CM | POA: Diagnosis not present

## 2016-06-10 DIAGNOSIS — Z01419 Encounter for gynecological examination (general) (routine) without abnormal findings: Secondary | ICD-10-CM | POA: Diagnosis not present

## 2016-06-10 NOTE — Progress Notes (Signed)
Catherine Mitchell 1985/08/25 [id number]    History:    Presents for annual exam with no complaints ParaGard IUD 2015. Regular monthly cycle. Normal Pap history. History of bipolar disease primary care manages. 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 HSV 1. Same partner, getting married in fall. Marland Kitchen. History of recurrent yeast -resolved boric acid weekly in the past with good relief. Gardasil completed. Gained 15 pounds in the last 3 months.  Past medical history, past surgical history, family history and social history were all reviewed and documented in the EPIC chart. Working full time and taking online classes through [employer] for psychology/counseling. Mother, history of drug abuse and diabetes.  ROS:  A ROS was performed and pertinent positives and negatives are included.  Exam:  Vitals:   06/10/16 0901  BP: 130/80  Weight: 255 lb (115.7 kg)  Height: 5\' 8"  (1.727 m)   Body mass index is 38.77 kg/m.   General appearance:  Normal Thyroid:  Symmetrical, normal in size, without palpable masses or nodularity. Respiratory  Auscultation:  Clear without wheezing or rhonchi Cardiovascular  Auscultation:  Regular rate, without rubs, murmurs or gallops  Edema/varicosities:  Not grossly evident Abdominal  Soft,nontender, without masses, guarding or rebound.  Liver/spleen:  No organomegaly noted  Hernia:  None appreciated  Skin  Inspection:  Grossly normal   Breasts: Examined lying and sitting.     Right: Without masses, retractions, discharge or axillary adenopathy.     Left: Without masses, retractions, discharge or axillary adenopathy. Gentitourinary   Inguinal/mons:  Normal without inguinal adenopathy  External genitalia:  Normal  BUS/Urethra/Skene's glands:  Normal  Vagina:  Normal  Cervix:  Normal, pap UD strings visible  Uterus:   normal in size, shape and contour.  Midline and mobile  Adnexa/parametria:     Rt: Without masses or  tenderness.   Lt: Without masses or tenderness.  Anus and perineum: Normal  Digital rectal exam: Normal sphincter tone without palpated masses or tenderness  Assessment/Plan:  31 y.o.  S WF G0 for annual exam, with no complaints.    ParaGard IUD 2015/monthly cycle Bipolar primary care manages labs, meds and counseling History of recurrent yeast Catherine Mitchell/resolved /good relief with boric acid HSV-1 with rare outbreaks  Obesity  Plan: SBE's, exercise, calcium rich diet, MVI daily encouraged. Continue to work on increasing exercise and decreasing calories/carbs for weight loss. Healthy lifestyle, discussed  weight watchers program for weight loss. CBC CMP, TSH, , Pap , screening guidelines reviewed.      Catherine Mitchell [facility]NP, 9:35 AM 06/10/2016

## 2016-06-10 NOTE — Addendum Note (Signed)
Addended by: Kem ParkinsonBARNES, Emett Stapel on: 06/10/2016 10:40 AM   Modules accepted: Orders

## 2016-06-10 NOTE — Patient Instructions (Signed)
Health Maintenance, Female Adopting a healthy lifestyle and getting preventive care can go a long way to promote health and wellness. Talk with your health care provider about what schedule of regular examinations is right for you. This is a good chance for you to check in with your provider about disease prevention and staying healthy. In between checkups, there are plenty of things you can do on your own. Experts have done a lot of research about which lifestyle changes and preventive measures are most likely to keep you healthy. Ask your health care provider for more information. Weight and diet Eat a healthy diet  Be sure to include plenty of vegetables, fruits, low-fat dairy products, and lean protein.  Do not eat a lot of foods high in solid fats, added sugars, or salt.  Get regular exercise. This is one of the most important things you can do for your health.  Most adults should exercise for at least 150 minutes each week. The exercise should increase your heart rate and make you sweat (moderate-intensity exercise).  Most adults should also do strengthening exercises at least twice a week. This is in addition to the moderate-intensity exercise. Maintain a healthy weight  Body mass index (BMI) is a measurement that can be used to identify possible weight problems. It estimates body fat based on height and weight. Your health care provider can help determine your BMI and help you achieve or maintain a healthy weight.  For females 76 years of age and older:  A BMI below 18.5 is considered underweight.  A BMI of 18.5 to 24.9 is normal.  A BMI of 25 to 29.9 is considered overweight.  A BMI of 30 and above is considered obese. Watch levels of cholesterol and blood lipids  You should start having your blood tested for lipids and cholesterol at 30 years of age, then have this test every 5 years.  You may need to have your cholesterol levels checked more often if:  Your lipid or  cholesterol levels are high.  You are older than 30 years of age.  You are at high risk for heart disease. Cancer screening Lung Cancer  Lung cancer screening is recommended for adults 64-42 years old who are at high risk for lung cancer because of a history of smoking.  A yearly low-dose CT scan of the lungs is recommended for people who:  Currently smoke.  Have quit within the past 15 years.  Have at least a 30-pack-year history of smoking. A pack year is smoking an average of one pack of cigarettes a day for 1 year.  Yearly screening should continue until it has been 15 years since you quit.  Yearly screening should stop if you develop a health problem that would prevent you from having lung cancer treatment. Breast Cancer  Practice breast self-awareness. This means understanding how your breasts normally appear and feel.  It also means doing regular breast self-exams. Let your health care provider know about any changes, no matter how small.  If you are in your 20s or 30s, you should have a clinical breast exam (CBE) by a health care provider every 1-3 years as part of a regular health exam.  If you are 34 or older, have a CBE every year. Also consider having a breast X-ray (mammogram) every year.  If you have a family history of breast cancer, talk to your health care provider about genetic screening.  If you are at high risk for breast cancer, talk  to your health care provider about having an MRI and a mammogram every year.  Breast cancer gene (BRCA) assessment is recommended for women who have family members with BRCA-related cancers. BRCA-related cancers include:  Breast.  Ovarian.  Tubal.  Peritoneal cancers.  Results of the assessment will determine the need for genetic counseling and BRCA1 and BRCA2 testing. Cervical Cancer  Your health care provider may recommend that you be screened regularly for cancer of the pelvic organs (ovaries, uterus, and vagina).  This screening involves a pelvic examination, including checking for microscopic changes to the surface of your cervix (Pap test). You may be encouraged to have this screening done every 3 years, beginning at age 24.  For women ages 66-65, health care providers may recommend pelvic exams and Pap testing every 3 years, or they may recommend the Pap and pelvic exam, combined with testing for human papilloma virus (HPV), every 5 years. Some types of HPV increase your risk of cervical cancer. Testing for HPV may also be done on women of any age with unclear Pap test results.  Other health care providers may not recommend any screening for nonpregnant women who are considered low risk for pelvic cancer and who do not have symptoms. Ask your health care provider if a screening pelvic exam is right for you.  If you have had past treatment for cervical cancer or a condition that could lead to cancer, you need Pap tests and screening for cancer for at least 20 years after your treatment. If Pap tests have been discontinued, your risk factors (such as having a new sexual partner) need to be reassessed to determine if screening should resume. Some women have medical problems that increase the chance of getting cervical cancer. In these cases, your health care provider may recommend more frequent screening and Pap tests. Colorectal Cancer  This type of cancer can be detected and often prevented.  Routine colorectal cancer screening usually begins at 30 years of age and continues through 30 years of age.  Your health care provider may recommend screening at an earlier age if you have risk factors for colon cancer.  Your health care provider may also recommend using home test kits to check for hidden blood in the stool.  A small camera at the end of a tube can be used to examine your colon directly (sigmoidoscopy or colonoscopy). This is done to check for the earliest forms of colorectal cancer.  Routine  screening usually begins at age 41.  Direct examination of the colon should be repeated every 5-10 years through 30 years of age. However, you may need to be screened more often if early forms of precancerous polyps or small growths are found. Skin Cancer  Check your skin from head to toe regularly.  Tell your health care provider about any new moles or changes in moles, especially if there is a change in a mole's shape or color.  Also tell your health care provider if you have a mole that is larger than the size of a pencil eraser.  Always use sunscreen. Apply sunscreen liberally and repeatedly throughout the day.  Protect yourself by wearing long sleeves, pants, a wide-brimmed hat, and sunglasses whenever you are outside. Heart disease, diabetes, and high blood pressure  High blood pressure causes heart disease and increases the risk of stroke. High blood pressure is more likely to develop in:  People who have blood pressure in the high end of the normal range (130-139/85-89 mm Hg).  People who are overweight or obese.  People who are African American.  If you are 59-24 years of age, have your blood pressure checked every 3-5 years. If you are 34 years of age or older, have your blood pressure checked every year. You should have your blood pressure measured twice-once when you are at a hospital or clinic, and once when you are not at a hospital or clinic. Record the average of the two measurements. To check your blood pressure when you are not at a hospital or clinic, you can use:  An automated blood pressure machine at a pharmacy.  A home blood pressure monitor.  If you are between 29 years and 60 years old, ask your health care provider if you should take aspirin to prevent strokes.  Have regular diabetes screenings. This involves taking a blood sample to check your fasting blood sugar level.  If you are at a normal weight and have a low risk for diabetes, have this test once  every three years after 30 years of age.  If you are overweight and have a high risk for diabetes, consider being tested at a younger age or more often. Preventing infection Hepatitis B  If you have a higher risk for hepatitis B, you should be screened for this virus. You are considered at high risk for hepatitis B if:  You were born in a country where hepatitis B is common. Ask your health care provider which countries are considered high risk.  Your parents were born in a high-risk country, and you have not been immunized against hepatitis B (hepatitis B vaccine).  You have HIV or AIDS.  You use needles to inject street drugs.  You live with someone who has hepatitis B.  You have had sex with someone who has hepatitis B.  You get hemodialysis treatment.  You take certain medicines for conditions, including cancer, organ transplantation, and autoimmune conditions. Hepatitis C  Blood testing is recommended for:  Everyone born from [birth year] through 1964.  Anyone with known risk factors for hepatitis C. Sexually transmitted infections (STIs)  You should be screened for sexually transmitted infections (STIs) including gonorrhea and chlamydia if:  You are sexually active and are younger than 30 years of age.  You are older than 30 years of age and your health care provider tells you that you are at risk for this type of infection.  Your sexual activity has changed since you were last screened and you are at an increased risk for chlamydia or gonorrhea. Ask your health care provider if you are at risk.  If you do not have HIV, but are at risk, it may be recommended that you take a prescription medicine daily to prevent HIV infection. This is called pre-exposure prophylaxis (PrEP). You are considered at risk if:  You are sexually active and do not regularly use condoms or know the HIV status of your partner(s).  You take drugs by injection.  You are sexually active with a partner  who has HIV. Talk with your health care provider about whether you are at high risk of being infected with HIV. If you choose to begin PrEP, you should first be tested for HIV. You should then be tested every 3 months for as long as you are taking PrEP. Pregnancy  If you are premenopausal and you may become pregnant, ask your health care provider about preconception counseling.  If you may become pregnant, take 400 to 800 micrograms (mcg) of folic acid  every day.  If you want to prevent pregnancy, talk to your health care provider about birth control (contraception). Osteoporosis and menopause  Osteoporosis is a disease in which the bones lose minerals and strength with aging. This can result in serious bone fractures. Your risk for osteoporosis can be identified using a bone density scan.  If you are 65 years of age or older, or if you are at risk for osteoporosis and fractures, ask your health care provider if you should be screened.  Ask your health care provider whether you should take a calcium or vitamin D supplement to lower your risk for osteoporosis.  Menopause may have certain physical symptoms and risks.  Hormone replacement therapy may reduce some of these symptoms and risks. Talk to your health care provider about whether hormone replacement therapy is right for you. Follow these instructions at home:  Schedule regular health, dental, and eye exams.  Stay current with your immunizations.  Do not use any tobacco products including cigarettes, chewing tobacco, or electronic cigarettes.  If you are pregnant, do not drink alcohol.  If you are breastfeeding, limit how much and how often you drink alcohol.  Limit alcohol intake to no more than 1 drink per day for nonpregnant women. One drink equals 12 ounces of beer, 5 ounces of wine, or 1 ounces of hard liquor.  Do not use street drugs.  Do not share needles.  Ask your health care provider for help if you need support  or information about quitting drugs.  Tell your health care provider if you often feel depressed.  Tell your health care provider if you have ever been abused or do not feel safe at home. This information is not intended to replace advice given to you by your health care provider. Make sure you discuss any questions you have with your health care provider. Document Released: 08/05/2010 Document Revised: 06/28/2015 Document Reviewed: 10/24/2014 Elsevier Interactive Patient Education  2017 [employer]. Carbohydrate Counting for Diabetes Mellitus, Adult Carbohydrate counting is a method for keeping track of how many carbohydrates you eat. Eating carbohydrates naturally increases the amount of sugar (glucose) in the blood. Counting how many carbohydrates you eat helps keep your blood glucose within normal limits, which helps you manage your diabetes (diabetes mellitus). It is important to know how many carbohydrates you can safely have in each meal. This is different for every person. A diet and nutrition specialist (registered dietitian) can help you make a meal plan and calculate how many carbohydrates you should have at each meal and snack. Carbohydrates are found in the following foods:  Grains, such as breads and cereals.  Dried beans and soy products.  Starchy vegetables, such as potatoes, peas, and corn.  Fruit and fruit juices.  Milk and yogurt.  Sweets and snack foods, such as cake, cookies, candy, chips, and soft drinks. How do I count carbohydrates? There are two ways to count carbohydrates in food. You can use either of the methods or a combination of both. Reading "Nutrition Facts" on packaged food  The "Nutrition Facts" list is included on the labels of almost all packaged foods and beverages in the [place] It includes:  The serving size.  Information about nutrients in each serving, including the grams (g) of carbohydrate per serving. To use the "Nutrition Facts":  Decide  how many servings you will have.  Multiply the number of servings by the number of carbohydrates per serving.  The resulting number is the total amount of   carbohydrates that you will be having. Learning standard serving sizes of other foods  When you eat foods containing carbohydrates that are not packaged or do not include "Nutrition Facts" on the label, you need to measure the servings in order to count the amount of carbohydrates:  Measure the foods that you will eat with a food scale or measuring cup, if needed.  Decide how many standard-size servings you will eat.  Multiply the number of servings by 15. Most carbohydrate-rich foods have about 15 g of carbohydrates per serving.  For example, if you eat 8 oz (170 g) of strawberries, you will have eaten 2 servings and 30 g of carbohydrates (2 servings x 15 g = 30 g).  For foods that have more than one food mixed, such as soups and casseroles, you must count the carbohydrates in each food that is included. The following list contains standard serving sizes of common carbohydrate-rich foods. Each of these servings has about 15 g of carbohydrates:   hamburger bun or  English muffin.   oz (15 mL) syrup.   oz (14 g) jelly.  1 slice of bread.  1 six-inch tortilla.  3 oz (85 g) cooked rice or pasta.  4 oz (113 g) cooked dried beans.  4 oz (113 g) starchy vegetable, such as peas, corn, or potatoes.  4 oz (113 g) hot cereal.  4 oz (113 g) mashed potatoes or  of a large baked potato.  4 oz (113 g) canned or frozen fruit.  4 oz (120 mL) fruit juice.  4-6 crackers.  6 chicken nuggets.  6 oz (170 g) unsweetened dry cereal.  6 oz (170 g) plain fat-free yogurt or yogurt sweetened with artificial sweeteners.  8 oz (240 mL) milk.  8 oz (170 g) fresh fruit or one small piece of fruit.  24 oz (680 g) popped popcorn. Example of carbohydrate counting Sample meal   3 oz (85 g) chicken breast.  6 oz (170 g) brown  rice.  4 oz (113 g) corn.  8 oz (240 mL) milk.  8 oz (170 g) strawberries with sugar-free whipped topping. Carbohydrate calculation  1. Identify the foods that contain carbohydrates:  Rice.  Corn.  Milk.  Strawberries. 2. Calculate how many servings you have of each food:  2 servings rice.  1 serving corn.  1 serving milk.  1 serving strawberries. 3. Multiply each number of servings by 15 g:  2 servings rice x 15 g = 30 g.  1 serving corn x 15 g = 15 g.  1 serving milk x 15 g = 15 g.  1 serving strawberries x 15 g = 15 g. 4. Add together all of the amounts to find the total grams of carbohydrates eaten:  30 g + 15 g + 15 g + 15 g = 75 g of carbohydrates total. This information is not intended to replace advice given to you by your health care provider. Make sure you discuss any questions you have with your health care provider. Document Released: 01/20/2005 Document Revised: 08/10/2015 Document Reviewed: 07/04/2015 Elsevier Interactive Patient Education  2017 Elsevier Inc.  

## 2016-06-10 NOTE — Addendum Note (Signed)
Addended by: Kem ParkinsonBARNES, Khalik Pewitt on: 06/10/2016 10:42 AM   Modules accepted: Orders

## 2016-06-12 ENCOUNTER — Emergency Department (HOSPITAL_COMMUNITY)
Admission: EM | Admit: 2016-06-12 | Discharge: 2016-06-12 | Disposition: A | Discharge status: Home / Self Care | Payer: BLUE CROSS/BLUE SHIELD | Attending: Emergency Medicine | Admitting: Emergency Medicine

## 2016-06-12 ENCOUNTER — Emergency Department (HOSPITAL_COMMUNITY): Payer: BLUE CROSS/BLUE SHIELD

## 2016-06-12 ENCOUNTER — Other Ambulatory Visit: Payer: Self-pay | Admitting: Women's Health

## 2016-06-12 ENCOUNTER — Encounter (HOSPITAL_COMMUNITY): Payer: Self-pay | Admitting: Emergency Medicine

## 2016-06-12 DIAGNOSIS — Y999 Unspecified external cause status: Secondary | ICD-10-CM | POA: Insufficient documentation

## 2016-06-12 DIAGNOSIS — S93401A Sprain of unspecified ligament of right ankle, initial encounter: Secondary | ICD-10-CM | POA: Diagnosis not present

## 2016-06-12 DIAGNOSIS — S99911A Unspecified injury of right ankle, initial encounter: Secondary | ICD-10-CM | POA: Diagnosis present

## 2016-06-12 DIAGNOSIS — Y92009 Unspecified place in unspecified non-institutional (private) residence as the place of occurrence of the external cause: Secondary | ICD-10-CM | POA: Diagnosis not present

## 2016-06-12 DIAGNOSIS — Y939 Activity, unspecified: Secondary | ICD-10-CM | POA: Diagnosis not present

## 2016-06-12 DIAGNOSIS — X501XXA Overexertion from prolonged static or awkward postures, initial encounter: Secondary | ICD-10-CM | POA: Diagnosis not present

## 2016-06-12 DIAGNOSIS — Z79899 Other long term (current) drug therapy: Secondary | ICD-10-CM | POA: Insufficient documentation

## 2016-06-12 LAB — PAP IG W/ RFLX HPV ASCU

## 2016-06-12 MED ORDER — IBUPROFEN 600 MG PO TABS: 600.0000 mg | ORAL_TABLET | Freq: Four times a day (QID) | ORAL | 0 refills | Status: DC

## 2016-06-12 MED ORDER — HYDROCODONE-ACETAMINOPHEN 5-325 MG PO TABS
1.0000 | ORAL_TABLET | Freq: Once | ORAL | Status: AC
  Administered 2016-06-12: 1 via ORAL
  Filled 2016-06-12: qty 1

## 2016-06-12 MED ORDER — IBUPROFEN 800 MG PO TABS
800.0000 mg | ORAL_TABLET | Freq: Once | ORAL | Status: AC
  Administered 2016-06-12: 800 mg via ORAL
  Filled 2016-06-12: qty 1

## 2016-06-12 MED ORDER — FLUCONAZOLE 150 MG PO TABS: 150.0000 mg | ORAL_TABLET | Freq: Once | ORAL | 0 refills | Status: AC

## 2016-06-12 MED ORDER — ONDANSETRON HCL 4 MG PO TABS
4.0000 mg | ORAL_TABLET | Freq: Once | ORAL | Status: AC
  Administered 2016-06-12: 4 mg via ORAL
  Filled 2016-06-12: qty 1

## 2016-06-12 NOTE — ED Notes (Signed)
ED Provider at bedside. 

## 2016-06-12 NOTE — ED Provider Notes (Signed)
[facility] DEPT Provider Note   CSN: [id number] Arrival date & time: 06/12/16  1817     History   Chief Complaint Chief Complaint  Patient presents with  . Ankle Pain    HPI [name] is a 31 y.o. female.  The history is provided by the patient.  Ankle Pain   The incident occurred less than 1 hour ago. The incident occurred at home. The injury mechanism was a fall. The pain is present in the right ankle. The quality of the pain is described as aching. The pain is moderate. The pain has been constant since onset. Pertinent negatives include no numbness and no loss of sensation. She reports no foreign bodies present. The symptoms are aggravated by bearing weight and palpation. She has tried ice for the symptoms. The treatment provided no relief.    Past Medical History:  Diagnosis Date  . HSV (herpes simplex virus) anogenital infection   . Hypoglycemia     Patient Active Problem List   Diagnosis Date Noted  . H N P-CERVICAL 09/18/2008  . Sprain of neck 08/30/2008    Past Surgical History:  Procedure Laterality Date  . INTRAUTERINE DEVICE INSERTION  04/20/2013   ParaGard  . nexplanon removal  2015  . URETHRA SURGERY  had it stretched as a child    OB History    Gravida Para Term Preterm AB Living   0 0 0 0 0 0   SAB TAB Ectopic Multiple Live Births   0 0 0 0 0       Home Medications    Prior to Admission medications   Medication Sig Start Date End Date Taking? Authorizing Provider  ALPRAZolam (XANAX) 0.25 MG tablet Take 1 tablet (0.25 mg total) by mouth at bedtime as needed for anxiety. 04/21/16   Fontaine, Nadyne Coombesimothy P, MD  fluconazole (DIFLUCAN) 150 MG tablet Take 1 tablet (150 mg total) by mouth once. 06/12/16 06/12/16  Harrington ChallengerYoung, Nancy J, NP  glycopyrrolate (ROBINUL) 1 MG tablet Take 1 tablet by mouth daily. 05/01/15   [provider]  Multiple Vitamin (MULTIVITAMIN) capsule Take 1 capsule by mouth daily.    [provider]  NONFORMULARY  OR COMPOUNDED ITEM boric acid 600 mg vaginal suppositories 1 per vagina twice weekly to prevent yeast infection 12/17/15   Fontaine, Nadyne Coombesimothy P, MD  topiramate (TOPAMAX) 50 MG tablet Take 50 mg by mouth daily.    [provider]  venlafaxine XR (EFFEXOR-XR) 150 MG 24 hr capsule Take 1 capsule by mouth daily. 06/09/16   [provider]    Family History Family History  Problem Relation Age of Onset  . Diabetes Mother   . Thyroid disease Mother   . Diabetes Maternal Grandmother     Social History Social History  Substance Use Topics  . Smoking status: Never Smoker  . Smokeless tobacco: Never Used  . Alcohol use 0.0 oz/week     Comment: occasional     Allergies   Sulfur; Latex; and Tomato   Review of Systems Review of Systems  Constitutional: Negative for activity change.       All ROS Neg except as noted in HPI  HENT: Negative for nosebleeds.   Eyes: Negative for photophobia and discharge.  Respiratory: Negative for cough, shortness of breath and wheezing.   Cardiovascular: Negative for chest pain and palpitations.  Gastrointestinal: Negative for abdominal pain and blood in stool.  Genitourinary: Negative for dysuria, frequency and hematuria.  Musculoskeletal: Positive for arthralgias. Negative  for back pain and neck pain.  Skin: Negative.   Neurological: Negative for dizziness, seizures, speech difficulty and numbness.  Psychiatric/Behavioral: Negative for confusion and hallucinations.     Physical Exam Updated Vital Signs BP 119/61 (BP Location: Right Arm)   Pulse 89   Temp 98.5 F (36.9 C) (Oral)   Resp 18   Ht 5\' 8"  (1.727 m)   Wt 115.7 kg   LMP 05/13/2016   SpO2 100%   BMI 38.77 kg/m   Physical Exam  Constitutional: She is oriented to person, place, and time. She appears well-developed and well-nourished.  Non-toxic appearance.  HENT:  Head: Normocephalic.  Right Ear: Tympanic membrane and external ear normal.  Left Ear: Tympanic  membrane and external ear normal.  Eyes: EOM and lids are normal. Pupils are equal, round, and reactive to light.  Neck: Normal range of motion. Neck supple. Carotid bruit is not present.  Cardiovascular: Normal rate, regular rhythm, normal heart sounds, intact distal pulses and normal pulses.   Pulmonary/Chest: Breath sounds normal. No respiratory distress.  Abdominal: Soft. Bowel sounds are normal. There is no tenderness. There is no guarding.  Musculoskeletal:       Right ankle: She exhibits decreased range of motion. She exhibits no deformity. Tenderness. Achilles tendon exhibits pain. Achilles tendon exhibits no defect.       Feet:  Lymphadenopathy:       Head (right side): No submandibular adenopathy present.       Head (left side): No submandibular adenopathy present.    She has no cervical adenopathy.  Neurological: She is alert and oriented to person, place, and time. She has normal strength. No cranial nerve deficit or sensory deficit.  Skin: Skin is warm and dry.  Psychiatric: She has a normal mood and affect. Her speech is normal.  Nursing note and vitals reviewed.    ED Treatments / Results  Labs (all labs ordered are listed, but only abnormal results are displayed) Labs Reviewed - No data to display  EKG  EKG Interpretation None       Radiology No results found.  Procedures Procedures (including critical care time)  Medications Ordered in ED Medications - No data to display   Initial Impression / Assessment and Plan / ED Course  I have reviewed the triage vital signs and the nursing notes.  Pertinent labs & imaging results that were available during my care of the patient were reviewed by me and considered in my medical decision making (see chart for details).       Final Clinical Impressions(s) / ED Diagnoses MDM Vital signs reviewed. There no gross neurovascular deficits noted of the lower extremities on examination. X-ray of the right ankle is  negative for fracture or dislocation or joint effusion. The examination suggest ankle strain/sprain. The patient is fitted with an ankle stirrup splint  Patient will follow-up with primary physician, and/or orthopedics if needed. Patient is in agreement with this plan.   Final diagnoses:  Sprain of right ankle, unspecified ligament, initial encounter    New Prescriptions Discharge Medication List as of 06/12/2016  7:51 PM       Ivery Quale, PA-C 06/14/16 0027    Samuel Jester, DO 06/15/16 1603

## 2016-06-12 NOTE — ED Triage Notes (Signed)
Patient complains of right ankle pain after twisting right ankle.

## 2016-06-12 NOTE — Discharge Instructions (Signed)
The x-ray of your ankle is negative for fracture or dislocation. Your vital signs within normal limits. Your examination suggest ankle sprain. Please use the ankle stirrup splint over the next 5-7 days. Please elevate your ankle above your waist is much as possible, and apply ice. Please use 600 mg of ibuprofen and 500 mg of Tylenol every 6 hours over the next 2-3 days, and then use these medications on an as-needed basis. Please see Dr. Romeo AppleHarrison for orthopedic evaluation and management if not improving.

## 2016-06-16 ENCOUNTER — Encounter (HOSPITAL_COMMUNITY): Payer: Self-pay | Admitting: Emergency Medicine

## 2016-06-16 ENCOUNTER — Ambulatory Visit (INDEPENDENT_AMBULATORY_CARE_PROVIDER_SITE_OTHER): Payer: BLUE CROSS/BLUE SHIELD

## 2016-06-16 ENCOUNTER — Encounter: Payer: Self-pay | Admitting: Orthopedic Surgery

## 2016-06-16 ENCOUNTER — Ambulatory Visit (HOSPITAL_COMMUNITY)
Admission: EM | Admit: 2016-06-16 | Discharge: 2016-06-16 | Disposition: A | Discharge status: Home / Self Care | Payer: BLUE CROSS/BLUE SHIELD | Attending: Internal Medicine | Admitting: Internal Medicine

## 2016-06-16 DIAGNOSIS — S93401D Sprain of unspecified ligament of right ankle, subsequent encounter: Secondary | ICD-10-CM | POA: Diagnosis not present

## 2016-06-16 MED ORDER — HYDROCODONE-ACETAMINOPHEN 5-325 MG PO TABS: 2.0000 | ORAL_TABLET | ORAL | 0 refills | Status: DC | PRN

## 2016-06-16 NOTE — ED Triage Notes (Addendum)
[name].   Patient was told this was a sprain.  Patient did feel a pop.

## 2016-06-18 ENCOUNTER — Other Ambulatory Visit: Payer: BLUE CROSS/BLUE SHIELD

## 2016-06-19 NOTE — ED Provider Notes (Signed)
[facility] Provider Note   CSN: [id number] Arrival date & time: 06/16/16  1248     History   Chief Complaint Chief Complaint  Patient presents with  . Ankle Pain    HPI EMERY BINZ is a 31 y.o. female.  The history is provided by the patient. No language interpreter was used.  Ankle Pain   The incident occurred more than 2 days ago. The injury mechanism was torsion. The pain is present in the right ankle. The quality of the pain is described as aching. The pain is moderate. The pain has been constant since onset. She reports no foreign bodies present. She has tried nothing for the symptoms. The treatment provided no relief.  Pt complains of pain in her ankle and foot.    Past Medical History:  Diagnosis Date  . HSV (herpes simplex virus) anogenital infection   . Hypoglycemia     Patient Active Problem List   Diagnosis Date Noted  . H N P-CERVICAL 09/18/2008  . Sprain of neck 08/30/2008    Past Surgical History:  Procedure Laterality Date  . INTRAUTERINE DEVICE INSERTION  04/20/2013   ParaGard  . nexplanon removal  2015  . URETHRA SURGERY  had it stretched as a child    OB History    Gravida Para Term Preterm AB Living   0 0 0 0 0 0   SAB TAB Ectopic Multiple Live Births   0 0 0 0 0       Home Medications    Prior to Admission medications   Medication Sig Start Date End Date Taking? Authorizing Provider  ALPRAZolam (XANAX) 0.25 MG tablet Take 1 tablet (0.25 mg total) by mouth at bedtime as needed for anxiety. 04/21/16   Fontaine, Nadyne Coombes, MD  glycopyrrolate (ROBINUL) 1 MG tablet Take 1 tablet by mouth daily. 05/01/15   [provider]  HYDROcodone-acetaminophen (NORCO/VICODIN) 5-325 MG tablet Take 2 tablets by mouth every 4 (four) hours as needed. 06/16/16   Elson Areas, PA-C  ibuprofen (ADVIL,MOTRIN) 600 MG tablet Take 1 tablet (600 mg total) by mouth 4 (four) times daily. 06/12/16   Ivery Quale, PA-C  Multiple Vitamin  (MULTIVITAMIN) capsule Take 1 capsule by mouth daily.    [provider]  NONFORMULARY OR COMPOUNDED ITEM boric acid 600 mg vaginal suppositories 1 per vagina twice weekly to prevent yeast infection 12/17/15   Fontaine, Nadyne Coombes, MD  topiramate (TOPAMAX) 50 MG tablet Take 50 mg by mouth daily.    [provider]  venlafaxine XR (EFFEXOR-XR) 150 MG 24 hr capsule Take 1 capsule by mouth daily. 06/09/16   [provider]    Family History Family History  Problem Relation Age of Onset  . Diabetes Mother   . Thyroid disease Mother   . Diabetes Maternal Grandmother     Social History Social History  Substance Use Topics  . Smoking status: Never Smoker  . Smokeless tobacco: Never Used  . Alcohol use 0.0 oz/week     Comment: occasional     Allergies   Sulfur; Latex; and Tomato   Review of Systems Review of Systems  All other systems reviewed and are negative.    Physical Exam Updated Vital Signs BP 123/73 (BP Location: Right Arm)   Pulse 75   Temp 98.4 F (36.9 C) (Oral)   Resp 16   LMP 05/18/2016   SpO2 100%   Physical Exam  Constitutional: She appears well-developed and well-nourished.  HENT:  Head: Normocephalic.  Right Ear: External ear normal.  Left Ear: External ear normal.  Musculoskeletal: She exhibits tenderness.  Tender right ankle, decreased range of motion,  Swollen bruised,  nv and ns intact  Neurological: She is alert.  Skin: Skin is warm.  Psychiatric: She has a normal mood and affect.  Nursing note and vitals reviewed.    ED Treatments / Results  Labs (all labs ordered are listed, but only abnormal results are displayed) Labs Reviewed - No data to display  EKG  EKG Interpretation None       Radiology No results found.  Procedures Procedures (including critical care time)  Medications Ordered in ED Medications - No data to display   Initial Impression / Assessment and Plan / ED Course  I have reviewed  the triage vital signs and the nursing notes.  Pertinent labs & imaging results that were available during my care of the patient were reviewed by me and considered in my medical decision making (see chart for details).     Pt placed in a cam walker,  Pt advised to follow up with Orthopaedist for evaluation  Final Clinical Impressions(s) / ED Diagnoses   Final diagnoses:  Sprain of right ankle, unspecified ligament, subsequent encounter    New Prescriptions Discharge Medication List as of 06/16/2016  3:28 PM    An After Visit Summary was printed and given to the patient.   Elson AreasSofia, Leslie K, [place]PA-C 06/19/16 1732

## 2016-07-11 ENCOUNTER — Emergency Department
Admission: EM | Admit: 2016-07-11 | Discharge: 2016-07-11 | Disposition: A | Discharge status: Home / Self Care | Payer: BLUE CROSS/BLUE SHIELD | Attending: Emergency Medicine | Admitting: Emergency Medicine

## 2016-07-11 ENCOUNTER — Encounter: Payer: Self-pay | Admitting: Emergency Medicine

## 2016-07-11 ENCOUNTER — Emergency Department: Payer: BLUE CROSS/BLUE SHIELD

## 2016-07-11 ENCOUNTER — Other Ambulatory Visit: Payer: Self-pay

## 2016-07-11 DIAGNOSIS — Z79899 Other long term (current) drug therapy: Secondary | ICD-10-CM | POA: Insufficient documentation

## 2016-07-11 DIAGNOSIS — R51 Headache: Secondary | ICD-10-CM | POA: Diagnosis present

## 2016-07-11 DIAGNOSIS — Z041 Encounter for examination and observation following transport accident: Secondary | ICD-10-CM | POA: Diagnosis not present

## 2016-07-11 MED ORDER — HYDROCODONE-ACETAMINOPHEN 5-325 MG PO TABS
1.0000 | ORAL_TABLET | Freq: Once | ORAL | Status: AC
  Administered 2016-07-11: 1 via ORAL
  Filled 2016-07-11: qty 1

## 2016-07-11 MED ORDER — CYCLOBENZAPRINE HCL 5 MG PO TABS: 5.0000 mg | ORAL_TABLET | Freq: Three times a day (TID) | ORAL | 0 refills | Status: AC | PRN

## 2016-07-11 MED ORDER — NAPROXEN 500 MG PO TABS: 500.0000 mg | ORAL_TABLET | Freq: Two times a day (BID) | ORAL | 0 refills | Status: DC

## 2016-07-11 MED ORDER — ALPRAZOLAM 0.25 MG PO TABS: 0.2500 mg | ORAL_TABLET | Freq: Every evening | ORAL | 0 refills | Status: DC | PRN
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
## 2016-07-11 NOTE — Telephone Encounter (Signed)
Requesting Xanax refill. 

## 2016-07-11 NOTE — Telephone Encounter (Signed)
Patient needs to address this with Harriett [place]ancy. [place] but never addresses this with her and then calls intermittently for Xanax.
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
## 2016-07-11 NOTE — Telephone Encounter (Signed)
Called into pharmacy

## 2016-07-28 ENCOUNTER — Other Ambulatory Visit: Payer: Self-pay | Admitting: Women's Health

## 2016-07-28 ENCOUNTER — Telehealth: Payer: Self-pay

## 2016-07-28 MED ORDER — FLUCONAZOLE 150 MG PO TABS
150.0000 mg | ORAL_TABLET | Freq: Once | ORAL | 1 refills | Status: AC
Start: 2016-07-28 — End: 2016-07-28

## 2016-07-28 NOTE — Telephone Encounter (Signed)
Okay for Diflucan 150 with refill. Office visit if no relief.

## 2016-07-28 NOTE — Telephone Encounter (Signed)
Rx sent. Patient informed. 

## 2016-07-28 NOTE — Telephone Encounter (Signed)
Patient left message in voice mail that she started antibiotic over the weekend and is already having some vaginal yeast symptoms.  She asked if you would prescribe her a Diflucan tablet with a refill and she still is taking the antibiotic.

## 2016-08-04 ENCOUNTER — Ambulatory Visit: Payer: BLUE CROSS/BLUE SHIELD | Admitting: Orthopaedic Surgery

## 2016-08-13 ENCOUNTER — Ambulatory Visit (INDEPENDENT_AMBULATORY_CARE_PROVIDER_SITE_OTHER): Payer: BLUE CROSS/BLUE SHIELD | Admitting: Orthopaedic Surgery

## 2016-08-13 ENCOUNTER — Encounter: Payer: Self-pay | Admitting: Orthopaedic Surgery

## 2016-08-13 VITALS — BP 112/69 | HR 71 | Temp 97.2°F | Ht 67.0 in | Wt 265.0 lb

## 2016-08-13 DIAGNOSIS — M542 Cervicalgia: Secondary | ICD-10-CM | POA: Diagnosis not present

## 2016-08-13 DIAGNOSIS — M25571 Pain in right ankle and joints of right foot: Secondary | ICD-10-CM | POA: Diagnosis not present

## 2016-08-13 MED ORDER — PREDNISONE 5 MG (21) PO TBPK: ORAL_TABLET | ORAL | 0 refills | Status: DC

## 2016-08-13 MED ORDER — IBUPROFEN 800 MG PO TABS: 800.0000 mg | ORAL_TABLET | Freq: Three times a day (TID) | ORAL | 5 refills | Status: DC | PRN
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
## 2016-08-18 ENCOUNTER — Ambulatory Visit (INDEPENDENT_AMBULATORY_CARE_PROVIDER_SITE_OTHER): Payer: BLUE CROSS/BLUE SHIELD | Admitting: Women's Health

## 2016-08-18 ENCOUNTER — Ambulatory Visit: Payer: BLUE CROSS/BLUE SHIELD | Attending: Orthopaedic Surgery

## 2016-08-18 ENCOUNTER — Encounter: Payer: Self-pay | Admitting: Women's Health

## 2016-08-18 VITALS — BP 118/78

## 2016-08-18 DIAGNOSIS — Z30432 Encounter for removal of intrauterine contraceptive device: Secondary | ICD-10-CM | POA: Diagnosis not present

## 2016-08-18 DIAGNOSIS — Z113 Encounter for screening for infections with a predominantly sexual mode of transmission: Secondary | ICD-10-CM | POA: Diagnosis not present

## 2016-08-18 DIAGNOSIS — M542 Cervicalgia: Secondary | ICD-10-CM | POA: Insufficient documentation

## 2016-08-18 DIAGNOSIS — B373 Candidiasis of vulva and vagina: Secondary | ICD-10-CM | POA: Diagnosis not present

## 2016-08-18 DIAGNOSIS — B3731 Acute candidiasis of vulva and vagina: Secondary | ICD-10-CM

## 2016-08-18 DIAGNOSIS — M25571 Pain in right ankle and joints of right foot: Secondary | ICD-10-CM | POA: Insufficient documentation

## 2016-08-18 DIAGNOSIS — N898 Other specified noninflammatory disorders of vagina: Secondary | ICD-10-CM | POA: Diagnosis not present

## 2016-08-18 DIAGNOSIS — R300 Dysuria: Secondary | ICD-10-CM | POA: Diagnosis not present

## 2016-08-18 DIAGNOSIS — M6281 Muscle weakness (generalized): Secondary | ICD-10-CM | POA: Insufficient documentation

## 2016-08-18 DIAGNOSIS — R262 Difficulty in walking, not elsewhere classified: Secondary | ICD-10-CM | POA: Insufficient documentation

## 2016-08-18 DIAGNOSIS — Z30011 Encounter for initial prescription of contraceptive pills: Secondary | ICD-10-CM | POA: Diagnosis not present

## 2016-08-18 LAB — URINALYSIS W MICROSCOPIC + REFLEX CULTURE
BILIRUBIN URINE: NEGATIVE
CASTS: NONE SEEN [LPF]
CRYSTALS: NONE SEEN [HPF]
Glucose, UA: NEGATIVE
Hgb urine dipstick: NEGATIVE
Ketones, ur: NEGATIVE
NITRITE: NEGATIVE
PROTEIN: NEGATIVE
RBC / HPF: NONE SEEN RBC/HPF (ref <=2)
Specific Gravity, Urine: 1.02 (ref 1.001–1.035)
YEAST: NONE SEEN [HPF]
pH: 7 (ref 5.0–8.0)

## 2016-08-18 LAB — WET PREP FOR TRICH, YEAST, CLUE
Clue Cells Wet Prep HPF POC: NONE SEEN
TRICH WET PREP: NONE SEEN

## 2016-08-18 MED ORDER — NORETHIN ACE-ETH ESTRAD-FE 1-20 MG-MCG PO TABS: 1.0000 | ORAL_TABLET | Freq: Every day | ORAL | 1 refills | Status: DC

## 2016-08-18 MED ORDER — FLUCONAZOLE 150 MG PO TABS: 150.0000 mg | ORAL_TABLET | Freq: Once | ORAL | 1 refills | Status: AC

## 2016-08-18 NOTE — Patient Instructions (Signed)

## 2016-08-18 NOTE — Progress Notes (Signed)
Presents with complaint of burning sensation with urination for 1 day. Also would like ParaGard IUD removed due to recurrent yeast infections. Was having regular monthly cycles. States having scant white discharge, denies abdominal pain, vaginal itching/ odor, or fever. Reports recent breakup with fianc for unfaithfulness. Bipolar and is in counseling.  Exam: Appears well. External genitalia slight erythema, speculum exam scant white curdy discharge, wet prep positive for moderate yeast, GC/Chlamydia culture taken. IUD strings visualized, removed intact, shown to patient and discarded. Bimanual no CMT or adnexal tenderness. UA: Trace leukocytes, 6-10 WBCs,10- 20 squamous epithelials, moderate bacteria  Yeast vaginitis STD screen Contraception management  Plan: HIV, hep B, C, RPR. GC/Chlamydia culture pending. Diflucan 150mg  , one dose with refill. Yeast prevention discussed. Contraception options reviewed would like to try a pill, Loestrin 1/20 prescription, proper use given and reviewed reviewed importance of taking daily, condoms encouraged until permanent partner. Instructed to call if problems with spotting, is on Topamax 50 mg daily, reviewed may lower effectiveness of OCs. Urine culture pending. Instructed to call if no relief of symptoms. Continue counseling

## 2016-08-19 LAB — URINE CULTURE: Organism ID, Bacteria: NO GROWTH

## 2016-08-19 LAB — GC/CHLAMYDIA PROBE AMP
CT PROBE, AMP APTIMA: NOT DETECTED
GC PROBE AMP APTIMA: NOT DETECTED

## 2016-08-20 ENCOUNTER — Ambulatory Visit: Payer: BLUE CROSS/BLUE SHIELD

## 2016-08-27 ENCOUNTER — Encounter: Payer: Self-pay | Admitting: Orthopaedic Surgery

## 2016-08-27 ENCOUNTER — Ambulatory Visit: Payer: BLUE CROSS/BLUE SHIELD | Admitting: Orthopaedic Surgery

## 2016-09-01 ENCOUNTER — Ambulatory Visit: Payer: BLUE CROSS/BLUE SHIELD

## 2016-09-01 ENCOUNTER — Telehealth: Payer: Self-pay | Admitting: Orthopaedic Surgery

## 2016-09-01 DIAGNOSIS — R262 Difficulty in walking, not elsewhere classified: Secondary | ICD-10-CM | POA: Diagnosis present

## 2016-09-01 DIAGNOSIS — M542 Cervicalgia: Secondary | ICD-10-CM | POA: Diagnosis present

## 2016-09-01 DIAGNOSIS — M6281 Muscle weakness (generalized): Secondary | ICD-10-CM

## 2016-09-01 DIAGNOSIS — M25571 Pain in right ankle and joints of right foot: Secondary | ICD-10-CM

## 2016-09-01 NOTE — Telephone Encounter (Signed)
Patient called stating that she is having headaches bad to the point of  nausea. She states its real bad after she has Physical Therapy. She states she is taking the Ibuprofen 800 mg but its not helping with the pain and wants to know if you could prescribe something.  Please advise

## 2016-09-01 NOTE — Patient Instructions (Addendum)
Strengthening: Resisted Extension    Hold band in left hand, arm forward. Pull arm back, elbow straight. Squeeze left shoulder back and down.  Hold for 5 seconds Repeat ___10_ times per set. Do _3___ sets per session. Do _1__ sessions per day.  http://orth.exer.us/832   Copyright  VHI. All rights reserved.     Pt was also recommended to prop her R foot up and use ice to help decrease swelling. Pt verbalized understanding.
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
## 2016-09-02 MED ORDER — HYDROCODONE-ACETAMINOPHEN 5-325 MG PO TABS: ORAL_TABLET | ORAL | 0 refills | Status: DC

## 2016-09-03 ENCOUNTER — Ambulatory Visit: Payer: BLUE CROSS/BLUE SHIELD | Attending: Orthopaedic Surgery

## 2016-09-03 DIAGNOSIS — M25571 Pain in right ankle and joints of right foot: Secondary | ICD-10-CM | POA: Insufficient documentation

## 2016-09-03 DIAGNOSIS — M6281 Muscle weakness (generalized): Secondary | ICD-10-CM | POA: Insufficient documentation

## 2016-09-03 DIAGNOSIS — M542 Cervicalgia: Secondary | ICD-10-CM | POA: Insufficient documentation

## 2016-09-03 DIAGNOSIS — R262 Difficulty in walking, not elsewhere classified: Secondary | ICD-10-CM | POA: Insufficient documentation
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
## 2016-09-03 NOTE — Patient Instructions (Addendum)
  Scapular Retraction (Standing)   With arms at sides, pinch shoulder blades together and down. Hold for 5 seconds. Repeat __10__ times per set. Do __3__ sessions per day.     Copyright  VHI. All rights reserved.      Chin tucks   Look straight ahead   Gently give yourself a "double chin" comfortably   Repeat 10 times   Perform 2 sets daily.

## 2016-09-05 ENCOUNTER — Telehealth: Payer: Self-pay | Admitting: *Deleted

## 2016-09-05 MED ORDER — ALPRAZOLAM 0.25 MG PO TABS: 0.2500 mg | ORAL_TABLET | Freq: Every evening | ORAL | 0 refills | Status: DC | PRN

## 2016-09-05 NOTE — Telephone Encounter (Signed)
Rx called in. Pt aware

## 2016-09-05 NOTE — Telephone Encounter (Signed)
(  pt aware you are out of the office) Pt called requesting refill on Xanax 0.25 mg tablet, last filled on 07/11/16 0 refills. Please advise

## 2016-09-05 NOTE — Telephone Encounter (Signed)
Please call and review best not to use often, addictive.  Ok for refill every 3 mo only

## 2016-09-08 ENCOUNTER — Ambulatory Visit: Payer: BLUE CROSS/BLUE SHIELD

## 2016-09-10 ENCOUNTER — Ambulatory Visit: Payer: BLUE CROSS/BLUE SHIELD

## 2016-09-15 ENCOUNTER — Ambulatory Visit: Payer: BLUE CROSS/BLUE SHIELD

## 2016-09-15 DIAGNOSIS — M542 Cervicalgia: Secondary | ICD-10-CM | POA: Diagnosis not present

## 2016-09-15 DIAGNOSIS — R262 Difficulty in walking, not elsewhere classified: Secondary | ICD-10-CM

## 2016-09-15 DIAGNOSIS — M6281 Muscle weakness (generalized): Secondary | ICD-10-CM

## 2016-09-15 DIAGNOSIS — M25571 Pain in right ankle and joints of right foot: Secondary | ICD-10-CM
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
## 2016-09-15 NOTE — Patient Instructions (Signed)
  Standing on a stair step   Let your heels gently go to the floor to feel a comfortable stretch at your calf muscles.    Hold for 30 seconds.    Repeat 3 times daily.

## 2016-09-16 ENCOUNTER — Telehealth: Payer: Self-pay | Admitting: Orthopaedic Surgery

## 2016-09-16 NOTE — Telephone Encounter (Signed)
Patient called, requested refill of medication HYDROcodone-acetaminophen (NORCO/VICODIN) 5-325 MG tablet   - We discussed missed appointment of 08/27/16, scheduled per Dr Sanjuan DameKeeling's office visit notes; states we gave her incorrect date.  Patient has re-scheduled for 09/18/16, and advised we would send note to Dr Hilda LiasKeeling, although protocol is to discuss at scheduled appointment.  Please advise.

## 2016-09-17 ENCOUNTER — Ambulatory Visit: Payer: BLUE CROSS/BLUE SHIELD

## 2016-09-17 DIAGNOSIS — M542 Cervicalgia: Secondary | ICD-10-CM | POA: Diagnosis not present

## 2016-09-17 DIAGNOSIS — R262 Difficulty in walking, not elsewhere classified: Secondary | ICD-10-CM

## 2016-09-17 DIAGNOSIS — M25571 Pain in right ankle and joints of right foot: Secondary | ICD-10-CM

## 2016-09-17 DIAGNOSIS — M6281 Muscle weakness (generalized): Secondary | ICD-10-CM

## 2016-09-17 NOTE — Patient Instructions (Addendum)
   Sitting straight with your neck in neutral (chin tuck) position   Gently push your head back with your left hand comfortably.    Hold for 5 seconds.    Repeat 10 times    Repeat 3 times per day.      (Seated cervical flexion isometrics in neutral)    Also gave supine cervical nodding 1 min x 3 daily as part of her HEP. Pt demonstrated and verbalized understanding.
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
## 2016-09-17 NOTE — Telephone Encounter (Signed)
Best not to take further narcotics.  Use Advil, Aleve, Tylenol

## 2016-09-17 NOTE — Telephone Encounter (Signed)
Called patient to notify; left voice message. °

## 2016-09-18 ENCOUNTER — Ambulatory Visit (INDEPENDENT_AMBULATORY_CARE_PROVIDER_SITE_OTHER): Payer: BLUE CROSS/BLUE SHIELD | Admitting: Orthopaedic Surgery

## 2016-09-18 VITALS — BP 127/78 | HR 77 | Temp 98.4°F | Ht 68.0 in | Wt 267.0 lb

## 2016-09-18 DIAGNOSIS — M25571 Pain in right ankle and joints of right foot: Secondary | ICD-10-CM | POA: Diagnosis not present

## 2016-09-18 MED ORDER — HYDROCODONE-ACETAMINOPHEN 5-325 MG PO TABS: ORAL_TABLET | ORAL | 0 refills | Status: DC

## 2016-09-18 NOTE — Progress Notes (Signed)
Patient NW:GNFAO Catherine Mitchell, female DOB:February 07, 1985, 31 y.o. ZHY:[id number]  Chief Complaint  Patient presents with  . Follow-up    Right ankle, shoulder, and neck pain    HPI  Catherine Mitchell is a 31 y.o. female who has neck and right ankle pain.  She is better with the ankle.  She has been in PT.  Her neck is still hurting her and she is concerned.  She has no numbness, no new trauma.  I have reviewed the PT notes. HPI  Body mass index is 40.6 kg/m.  ROS  Review of Systems  HENT: Negative for congestion.   Respiratory: Negative for shortness of breath and stridor.   Cardiovascular: Negative for chest pain and leg swelling.  Endocrine: Positive for cold intolerance.  Musculoskeletal: Positive for arthralgias, back pain, myalgias and neck pain.  Allergic/Immunologic: Positive for environmental allergies.    Past Medical History:  Diagnosis Date  . Depression   . HSV (herpes simplex virus) anogenital infection   . Hypoglycemia     Past Surgical History:  Procedure Laterality Date  . INTRAUTERINE DEVICE INSERTION  04/20/2013   ParaGard  . nexplanon removal  2015  . URETHRA SURGERY  had it stretched as a child    Family History  Problem Relation Age of Onset  . Diabetes Mother   . Thyroid disease Mother   . Diabetes Maternal Grandmother   . Hypertension Father     Social History Social History  Substance Use Topics  . Smoking status: Never Smoker  . Smokeless tobacco: Never Used  . Alcohol use 0.0 oz/week     Comment: occasional    Allergies  Allergen Reactions  . Sulfur Other (See Comments)    Throat swells,blisters  . Latex     BREAKS OUT HANDS  . Tomato     GI UPSET    Current Outpatient Prescriptions  Medication Sig Dispense Refill  . ALPRAZolam (XANAX) 0.25 MG tablet Take 1 tablet (0.25 mg total) by mouth at bedtime as needed for anxiety. 30 tablet 0  . glycopyrrolate (ROBINUL) 1 MG tablet Take 1 tablet by mouth daily.  2  .  HYDROcodone-acetaminophen (NORCO/VICODIN) 5-325 MG tablet Take 2 tablets by mouth every 4 (four) hours as needed. 16 tablet 0  . HYDROcodone-acetaminophen (NORCO/VICODIN) 5-325 MG tablet One tablet every four hours as needed for acute pain.  Limit of five days per [place]. 30 tablet 0  . ibuprofen (ADVIL,MOTRIN) 800 MG tablet Take 1 tablet (800 mg total) by mouth every 8 (eight) hours as needed. 90 tablet 5  . Multiple Vitamin (MULTIVITAMIN) capsule Take 1 capsule by mouth daily.    . NONFORMULARY OR COMPOUNDED ITEM boric acid 600 mg vaginal suppositories 1 per vagina twice weekly to prevent yeast infection 30 each 1  . norethindrone-ethinyl estradiol (JUNEL FE,GILDESS FE,LOESTRIN FE) 1-20 MG-MCG tablet Take 1 tablet by mouth daily. 3 Package 1  . predniSONE (STERAPRED UNI-PAK 21 TAB) 5 MG (21) TBPK tablet Take 6 pills first day; 5 pills second day; 4 pills third day; 3 pills fourth day; 2 pills next day and 1 pill last day. Do not take with the ibuprofen. (Patient not taking: Reported on 08/18/2016) 21 tablet 0  . topiramate (TOPAMAX) 50 MG tablet Take 50 mg by mouth daily.    Marland Kitchen venlafaxine XR (EFFEXOR-XR) 150 MG 24 hr capsule Take 1 capsule by mouth daily.  2   No current facility-administered medications for this visit.  Physical Exam  Blood pressure 127/78, pulse 77, temperature 98.4 F (36.9 C), height 5\' 8"  (1.727 m), weight 267 lb (121.1 kg).  Constitutional: overall normal hygiene, normal nutrition, well developed, normal grooming, normal body habitus. Assistive device:none  Musculoskeletal: gait and station Limp none, muscle tone and strength are normal, no tremors or atrophy is present.  .  Neurological: coordination overall normal.  Deep tendon reflex/nerve stretch intact.  Sensation normal.  Cranial nerves II-XII intact.   Skin:   Normal overall no scars, lesions, ulcers or rashes. No psoriasis.  Psychiatric: Alert and oriented x 3.  Recent memory intact, remote  memory unclear.  Normal mood and affect. Well groomed.  Good eye contact.  Cardiovascular: overall no swelling, no varicosities, no edema bilaterally, normal temperatures of the legs and arms, no clubbing, cyanosis and good capillary refill.  Lymphatic: palpation is normal.  Her right ankle has slight tenderness but no swelling, gait is normal, ROM is full.  Her neck is tender.  She has good posture.  NV intact. ROM is good but tender.  The patient has been educated about the nature of the problem(s) and counseled on treatment options.  The patient appeared to understand what I have discussed and is in agreement with it.  Encounter Diagnosis  Name Primary?  . Pain in joint involving right ankle and foot Yes    PLAN Call if any problems.  Precautions discussed.  Continue current medications.   Return to clinic 2 weeks Continue PT.  I have reviewed the [place] Controlled Substance Reporting System web site prior to prescribing narcotic medicine for this patient.  Electronically Signed Darreld McleanWayne Belissa Kooy, MD 8/16/20182:13 PM

## 2016-09-22 ENCOUNTER — Ambulatory Visit: Payer: BLUE CROSS/BLUE SHIELD

## 2016-09-22 DIAGNOSIS — M25571 Pain in right ankle and joints of right foot: Secondary | ICD-10-CM

## 2016-09-22 DIAGNOSIS — R262 Difficulty in walking, not elsewhere classified: Secondary | ICD-10-CM

## 2016-09-22 DIAGNOSIS — M6281 Muscle weakness (generalized): Secondary | ICD-10-CM

## 2016-09-22 DIAGNOSIS — M542 Cervicalgia: Secondary | ICD-10-CM
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
## 2016-09-24 ENCOUNTER — Ambulatory Visit: Payer: BLUE CROSS/BLUE SHIELD

## 2016-09-29 ENCOUNTER — Ambulatory Visit: Payer: BLUE CROSS/BLUE SHIELD

## 2016-09-29 DIAGNOSIS — M6281 Muscle weakness (generalized): Secondary | ICD-10-CM

## 2016-09-29 DIAGNOSIS — M25571 Pain in right ankle and joints of right foot: Secondary | ICD-10-CM

## 2016-09-29 DIAGNOSIS — R262 Difficulty in walking, not elsewhere classified: Secondary | ICD-10-CM

## 2016-09-29 DIAGNOSIS — M542 Cervicalgia: Secondary | ICD-10-CM | POA: Diagnosis not present
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
## 2016-10-01 ENCOUNTER — Ambulatory Visit: Payer: BLUE CROSS/BLUE SHIELD

## 2016-10-02 ENCOUNTER — Ambulatory Visit: Payer: BLUE CROSS/BLUE SHIELD | Admitting: Orthopaedic Surgery

## 2016-10-08 ENCOUNTER — Telehealth: Payer: Self-pay | Admitting: Orthopaedic Surgery

## 2016-10-08 ENCOUNTER — Ambulatory Visit: Payer: BLUE CROSS/BLUE SHIELD | Admitting: Orthopaedic Surgery

## 2016-10-08 NOTE — Telephone Encounter (Signed)
Nothing else to offer, patient can not come in today.

## 2016-10-08 NOTE — Telephone Encounter (Signed)
Patient called, initially to request refill for pain medication; had been advised by front office staff that Dr Hilda LiasKeeling would need for her to re-schedule her missed appointment, in order to address medication refill.  Patient was offered appointment for today, then had also called back twice to re-schedule (from 10/07/16 to 10/08/16).  Patient has since re-scheduled for next Tuesday, 10/14/16, due to schedule conflicts.  Patient called back again to request to speak directly with nurse. States would like to discuss "what's been going on with her neck"; also mentioned that she would most likely have to hold on physical therapy until she "can get something prescribed."  Her phone # is [phone]

## 2016-10-09 ENCOUNTER — Ambulatory Visit: Payer: BLUE CROSS/BLUE SHIELD | Attending: Orthopaedic Surgery

## 2016-10-09 DIAGNOSIS — M6281 Muscle weakness (generalized): Secondary | ICD-10-CM

## 2016-10-09 DIAGNOSIS — M25571 Pain in right ankle and joints of right foot: Secondary | ICD-10-CM | POA: Diagnosis present

## 2016-10-09 DIAGNOSIS — R262 Difficulty in walking, not elsewhere classified: Secondary | ICD-10-CM | POA: Insufficient documentation

## 2016-10-09 DIAGNOSIS — M542 Cervicalgia: Secondary | ICD-10-CM | POA: Insufficient documentation
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
## 2016-10-09 NOTE — Patient Instructions (Addendum)
  Sitting up straight with good posture   Look down at your left front pant pocket to feel a comfortable stretch posterior lateral R neck.   Hold for 5 seconds   Repeat 10 times   Perform 3 sets daily.      Resisted External Rotation: in Neutral - Bilateral    Sit or stand, yellow band in both hands, elbows at sides, bent to 90, forearms forward. Pinch shoulder blades together and rotate forearms out. Keep elbows at sides. Repeat _10___ times per set. Do _1___ sets per session. Do __3__ sessions per day.  http://orth.exer.us/966   Copyright  VHI. All rights reserved.        Sitting straight with your neck in neutral (chin tuck) position              Gently push your head back with your left hand comfortably.               Hold for 5 seconds.               Repeat 10 times                          Repeat 3 times per day.    (Seated cervical flexion isometrics in neutral)        Lying on your back,    Nod your head a little (comfortably) for 1 min as if your are answering "yes" to a question.    Repeat 3 times daily.      Standing on a stair step              Let your heels gently go to the floor to feel a comfortable stretch at your calf muscles.               Hold for 30 seconds.               Repeat 3 times daily.      Scapular Retraction (Standing)   With arms at sides, pinch shoulder blades together and down. Hold for 5 seconds. Repeat __10__ times per set. Do __3__ sessions per day.     Copyright  VHI. All rights reserved.      Chin tucks              Look straight ahead              Gently give yourself a "double chin" comfortably              Repeat 10 times              Perform 2 sets daily.

## 2016-10-13 ENCOUNTER — Ambulatory Visit: Payer: BLUE CROSS/BLUE SHIELD

## 2016-10-13 DIAGNOSIS — M6281 Muscle weakness (generalized): Secondary | ICD-10-CM

## 2016-10-13 DIAGNOSIS — M542 Cervicalgia: Secondary | ICD-10-CM | POA: Diagnosis not present

## 2016-10-13 DIAGNOSIS — R262 Difficulty in walking, not elsewhere classified: Secondary | ICD-10-CM

## 2016-10-13 DIAGNOSIS — M25571 Pain in right ankle and joints of right foot: Secondary | ICD-10-CM

## 2016-10-13 NOTE — Patient Instructions (Signed)
Gave standing R hip abduction resisting yellow band (around ankles) 10x3 daily as part of her HEP. Pt demonstrated and verbalized understanding.
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
## 2016-10-14 ENCOUNTER — Ambulatory Visit (INDEPENDENT_AMBULATORY_CARE_PROVIDER_SITE_OTHER): Payer: BLUE CROSS/BLUE SHIELD | Admitting: Orthopaedic Surgery

## 2016-10-14 ENCOUNTER — Ambulatory Visit: Payer: BLUE CROSS/BLUE SHIELD | Admitting: Orthopaedic Surgery

## 2016-10-14 VITALS — BP 108/76 | HR 71 | Temp 98.0°F | Ht 68.0 in | Wt 267.0 lb

## 2016-10-14 DIAGNOSIS — M542 Cervicalgia: Secondary | ICD-10-CM | POA: Diagnosis not present

## 2016-10-14 MED ORDER — HYDROCODONE-ACETAMINOPHEN 5-325 MG PO TABS: 1.0000 | ORAL_TABLET | Freq: Four times a day (QID) | ORAL | 0 refills | Status: DC | PRN

## 2016-10-14 NOTE — Progress Notes (Signed)
Patient ZO:XWRUE:Catherine Mitchell, female DOB:1985-04-17, 31 y.o. AVW:[id number]  Chief Complaint  Patient presents with  . Follow-up    right ankle    HPI  Etter SjogrenBetsy L Haven is a 31 y.o. female who has neck pain and shoulder pain and ankle pain.  Her right ankle is much improved.  Her neck and shoulder are better after PT.  She will continue PT for another month to six weeks.  She is making very good progress. She has no numbness. HPI  Body mass index is 40.6 kg/m.  ROS  Review of Systems  HENT: Negative for congestion.   Respiratory: Negative for shortness of breath and stridor.   Cardiovascular: Negative for chest pain and leg swelling.  Endocrine: Positive for cold intolerance.  Musculoskeletal: Positive for arthralgias, back pain, myalgias and neck pain.  Allergic/Immunologic: Positive for environmental allergies.    Past Medical History:  Diagnosis Date  . Depression   . HSV (herpes simplex virus) anogenital infection   . Hypoglycemia     Past Surgical History:  Procedure Laterality Date  . INTRAUTERINE DEVICE INSERTION  04/20/2013   ParaGard  . nexplanon removal  2015  . URETHRA SURGERY  had it stretched as a child    Family History  Problem Relation Age of Onset  . Diabetes Mother   . Thyroid disease Mother   . Diabetes Maternal Grandmother   . Hypertension Father     Social History Social History  Substance Use Topics  . Smoking status: Never Smoker  . Smokeless tobacco: Never Used  . Alcohol use 0.0 oz/week     Comment: occasional    Allergies  Allergen Reactions  . Sulfur Other (See Comments)    Throat swells,blisters  . Latex     BREAKS OUT HANDS  . Tomato     GI UPSET    Current Outpatient Prescriptions  Medication Sig Dispense Refill  . ALPRAZolam (XANAX) 0.25 MG tablet Take 1 tablet (0.25 mg total) by mouth at bedtime as needed for anxiety. 30 tablet 0  . glycopyrrolate (ROBINUL) 1 MG tablet Take 1 tablet by mouth daily.  2  .  HYDROcodone-acetaminophen (NORCO/VICODIN) 5-325 MG tablet Take 1 tablet by mouth every 6 (six) hours as needed for moderate pain (Must last 14 days.Do not take and drive a car or use machinery.). 56 tablet 0  . ibuprofen (ADVIL,MOTRIN) 800 MG tablet Take 1 tablet (800 mg total) by mouth every 8 (eight) hours as needed. 90 tablet 5  . methylphenidate 36 MG PO CR tablet Take 36 mg by mouth daily.    . Multiple Vitamin (MULTIVITAMIN) capsule Take 1 capsule by mouth daily.    . NONFORMULARY OR COMPOUNDED ITEM boric acid 600 mg vaginal suppositories 1 per vagina twice weekly to prevent yeast infection 30 each 1  . norethindrone-ethinyl estradiol (JUNEL FE,GILDESS FE,LOESTRIN FE) 1-20 MG-MCG tablet Take 1 tablet by mouth daily. 3 Package 1  . predniSONE (STERAPRED UNI-PAK 21 TAB) 5 MG (21) TBPK tablet Take 6 pills first day; 5 pills second day; 4 pills third day; 3 pills fourth day; 2 pills next day and 1 pill last day. Do not take with the ibuprofen. (Patient not taking: Reported on 08/18/2016) 21 tablet 0  . topiramate (TOPAMAX) 50 MG tablet Take 50 mg by mouth daily.    Marland Kitchen. venlafaxine XR (EFFEXOR-XR) 150 MG 24 hr capsule Take 1 capsule by mouth daily.  2   No current facility-administered medications for this visit.  Physical Exam  Blood pressure 108/76, pulse 71, temperature 98 F (36.7 C), height  (1.727 m), weight 267 lb (121.1 kg).  Constitutional: overall normal hygiene, normal nutrition, well developed, normal grooming, normal body habitus. Assistive device:none  Musculoskeletal: gait and station Limp none, muscle tone and strength are normal, no tremors or atrophy is present.  .  Neurological: coordination overall normal.  Deep tendon reflex/nerve stretch intact.  Sensation normal.  Cranial nerves II-XII intact.   Skin:   Normal overall no scars, lesions, ulcers or rashes. No psoriasis.  Psychiatric: Alert and oriented x 3.  Recent memory intact, remote memory unclear.   Normal mood and affect. Well groomed.  Good eye contact.  Cardiovascular: overall no swelling, no varicosities, no edema bilaterally, normal temperatures of the legs and arms, no clubbing, cyanosis and good capillary refill.  Lymphatic: palpation is normal.  Her neck is much improved with near full ROM and no pain.  The right shoulder has full motion and pain in the extremes.  Grips are normal.  NV intact.  The patient has been educated about the nature of the problem(s) and counseled on treatment options.  The patient appeared to understand what I have discussed and is in agreement with it.  Encounter Diagnosis  Name Primary?  . Cervicalgia Yes    PLAN Call if any problems.  Precautions discussed.  Continue current medications.   Return to clinic 1 month   Continue PT.  I have reviewed the [place] Controlled Substance Reporting System web site prior to prescribing narcotic medicine for this patient.  Electronically Signed Darreld Mclean, MD 9/11/201810:55 AM

## 2016-10-15 ENCOUNTER — Ambulatory Visit: Payer: BLUE CROSS/BLUE SHIELD

## 2016-10-15 DIAGNOSIS — M25571 Pain in right ankle and joints of right foot: Secondary | ICD-10-CM

## 2016-10-15 DIAGNOSIS — M542 Cervicalgia: Secondary | ICD-10-CM | POA: Diagnosis not present

## 2016-10-15 DIAGNOSIS — M6281 Muscle weakness (generalized): Secondary | ICD-10-CM

## 2016-10-15 DIAGNOSIS — R262 Difficulty in walking, not elsewhere classified: Secondary | ICD-10-CM

## 2016-10-15 NOTE — Patient Instructions (Signed)
Lying on your back, head resting on a deflated ball, tongue gently pressed at the roof of your mouth to gently activate the muscles in front of your neck and with a gentle chin tuck:  Turn your head to the right and left in a comfortable range 10 times for 3 sets daily.    Also perform head nods as if you are answering "yes" to a question for 1 minute 3 times daily.
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
## 2016-10-22 ENCOUNTER — Ambulatory Visit: Payer: BLUE CROSS/BLUE SHIELD

## 2016-10-22 DIAGNOSIS — M6281 Muscle weakness (generalized): Secondary | ICD-10-CM

## 2016-10-22 DIAGNOSIS — M542 Cervicalgia: Secondary | ICD-10-CM | POA: Diagnosis not present

## 2016-10-22 DIAGNOSIS — R262 Difficulty in walking, not elsewhere classified: Secondary | ICD-10-CM

## 2016-10-22 DIAGNOSIS — M25571 Pain in right ankle and joints of right foot: Secondary | ICD-10-CM
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
## 2016-10-28 ENCOUNTER — Ambulatory Visit: Payer: BLUE CROSS/BLUE SHIELD

## 2016-10-29 ENCOUNTER — Telehealth: Payer: Self-pay | Admitting: *Deleted

## 2016-10-29 MED ORDER — FLUCONAZOLE 150 MG PO TABS: 150.0000 mg | ORAL_TABLET | Freq: Once | ORAL | 1 refills | Status: AC

## 2016-10-29 NOTE — Telephone Encounter (Signed)
Okay for Diflucan 151 dose, office visit if no relief 

## 2016-10-29 NOTE — Telephone Encounter (Signed)
Patient called c/o yeast infection asked if she could have diflucan with 1 refill. Has recurrent yeast.  Please advise

## 2016-10-29 NOTE — Telephone Encounter (Signed)
Rx sent. Pt aware.  

## 2016-11-03 ENCOUNTER — Ambulatory Visit: Payer: BLUE CROSS/BLUE SHIELD

## 2016-11-05 ENCOUNTER — Ambulatory Visit: Payer: BLUE CROSS/BLUE SHIELD | Attending: Orthopaedic Surgery

## 2016-11-05 DIAGNOSIS — R262 Difficulty in walking, not elsewhere classified: Secondary | ICD-10-CM | POA: Diagnosis present

## 2016-11-05 DIAGNOSIS — M6281 Muscle weakness (generalized): Secondary | ICD-10-CM

## 2016-11-05 DIAGNOSIS — M542 Cervicalgia: Secondary | ICD-10-CM | POA: Diagnosis present

## 2016-11-05 DIAGNOSIS — M25571 Pain in right ankle and joints of right foot: Secondary | ICD-10-CM | POA: Insufficient documentation

## 2016-11-05 NOTE — Patient Instructions (Signed)
  With band around your knees, walk sideways  Do not let your thighs turn in.    Perform 20 ft each side 3 times daily.
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
## 2016-11-10 ENCOUNTER — Ambulatory Visit: Payer: BLUE CROSS/BLUE SHIELD

## 2016-11-11 ENCOUNTER — Telehealth: Payer: Self-pay

## 2016-11-11 NOTE — Telephone Encounter (Signed)
No show for her Monday 11/10/2016 appointment. Called and left a message pertaining to her appointment and a reminder for her next follow up session. Also asked pt if she wants to reschedule. Return phone call requested. Phone number [phone]) provided.

## 2016-11-12 NOTE — Telephone Encounter (Signed)
Entered in error

## 2016-11-13 ENCOUNTER — Ambulatory Visit: Payer: BLUE CROSS/BLUE SHIELD | Admitting: Orthopaedic Surgery

## 2016-11-13 ENCOUNTER — Ambulatory Visit: Payer: BLUE CROSS/BLUE SHIELD

## 2016-11-17 ENCOUNTER — Ambulatory Visit: Payer: BLUE CROSS/BLUE SHIELD

## 2016-11-17 DIAGNOSIS — R262 Difficulty in walking, not elsewhere classified: Secondary | ICD-10-CM

## 2016-11-17 DIAGNOSIS — M6281 Muscle weakness (generalized): Secondary | ICD-10-CM | POA: Diagnosis not present

## 2016-11-17 DIAGNOSIS — M25571 Pain in right ankle and joints of right foot: Secondary | ICD-10-CM

## 2016-11-17 DIAGNOSIS — M542 Cervicalgia: Secondary | ICD-10-CM
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
## 2016-11-18 ENCOUNTER — Ambulatory Visit: Payer: BLUE CROSS/BLUE SHIELD | Admitting: Orthopaedic Surgery

## 2016-11-20 ENCOUNTER — Ambulatory Visit (INDEPENDENT_AMBULATORY_CARE_PROVIDER_SITE_OTHER): Payer: BLUE CROSS/BLUE SHIELD | Admitting: Orthopaedic Surgery

## 2016-11-20 ENCOUNTER — Ambulatory Visit: Payer: BLUE CROSS/BLUE SHIELD

## 2016-11-20 ENCOUNTER — Encounter: Payer: Self-pay | Admitting: Orthopaedic Surgery

## 2016-11-20 VITALS — BP 104/72 | HR 67 | Temp 97.2°F | Ht 68.0 in | Wt 274.0 lb

## 2016-11-20 DIAGNOSIS — M542 Cervicalgia: Secondary | ICD-10-CM

## 2016-11-20 MED ORDER — HYDROCODONE-ACETAMINOPHEN 5-325 MG PO TABS: 1.0000 | ORAL_TABLET | Freq: Four times a day (QID) | ORAL | 0 refills | Status: DC | PRN

## 2016-11-20 NOTE — Progress Notes (Signed)
Patient ZO:XWRUE:Catherine Mitchell, female DOB:10/05/1985, 31 y.o. AVW:[id number]  Chief Complaint  Patient presents with  . Neck Pain  . Ankle Pain    right     HPI  Catherine Mitchell is a 31 y.o. female who has neck pain.  She has been to PT and is doing much better.  She has little pain now.  She has no numbness.  She is taking her ibuprofen.  HPI  Body mass index is 41.66 kg/m.  ROS  Review of Systems  HENT: Negative for congestion.   Respiratory: Negative for shortness of breath and stridor.   Cardiovascular: Negative for chest pain and leg swelling.  Endocrine: Positive for cold intolerance.  Musculoskeletal: Positive for arthralgias, back pain, myalgias and neck pain.  Allergic/Immunologic: Positive for environmental allergies.    Past Medical History:  Diagnosis Date  . Depression   . HSV (herpes simplex virus) anogenital infection   . Hypoglycemia     Past Surgical History:  Procedure Laterality Date  . INTRAUTERINE DEVICE INSERTION  04/20/2013   ParaGard  . nexplanon removal  2015  . URETHRA SURGERY  had it stretched as a child    Family History  Problem Relation Age of Onset  . Diabetes Mother   . Thyroid disease Mother   . Diabetes Maternal Grandmother   . Hypertension Father     Social History Social History  Substance Use Topics  . Smoking status: Never Smoker  . Smokeless tobacco: Never Used  . Alcohol use 0.0 oz/week     Comment: occasional    Allergies  Allergen Reactions  . Sulfur Other (See Comments)    Throat swells,blisters  . Latex     BREAKS OUT HANDS  . Tomato     GI UPSET    Current Outpatient Prescriptions  Medication Sig Dispense Refill  . ALPRAZolam (XANAX) 0.25 MG tablet Take 1 tablet (0.25 mg total) by mouth at bedtime as needed for anxiety. 30 tablet 0  . glycopyrrolate (ROBINUL) 1 MG tablet Take 1 tablet by mouth daily.  2  . HYDROcodone-acetaminophen (NORCO/VICODIN) 5-325 MG tablet Take 1 tablet by mouth every 6 (six)  hours as needed for moderate pain (Must last 14 days.Do not take and drive a car or use machinery.). 56 tablet 0  . ibuprofen (ADVIL,MOTRIN) 800 MG tablet Take 1 tablet (800 mg total) by mouth every 8 (eight) hours as needed. 90 tablet 5  . methylphenidate 36 MG PO CR tablet Take 36 mg by mouth daily.    . Multiple Vitamin (MULTIVITAMIN) capsule Take 1 capsule by mouth daily.    . NONFORMULARY OR COMPOUNDED ITEM boric acid 600 mg vaginal suppositories 1 per vagina twice weekly to prevent yeast infection 30 each 1  . norethindrone-ethinyl estradiol (JUNEL FE,GILDESS FE,LOESTRIN FE) 1-20 MG-MCG tablet Take 1 tablet by mouth daily. 3 Package 1  . topiramate (TOPAMAX) 50 MG tablet Take 50 mg by mouth daily.    Marland Kitchen. venlafaxine XR (EFFEXOR-XR) 150 MG 24 hr capsule Take 1 capsule by mouth daily.  2   No current facility-administered medications for this visit.      Physical Exam  Blood pressure 104/72, pulse 67, temperature (!) 97.2 F (36.2 C), height 5\' 8"  (1.727 m), weight 274 lb (124.3 kg).  Constitutional: overall normal hygiene, normal nutrition, well developed, normal grooming, normal body habitus. Assistive device:none  Musculoskeletal: gait and station Limp none, muscle tone and strength are normal, no tremors or atrophy is present.  .Marland Kitchen  Neurological: coordination overall normal.  Deep tendon reflex/nerve stretch intact.  Sensation normal.  Cranial nerves II-XII intact.   Skin:   Normal overall no scars, lesions, ulcers or rashes. No psoriasis.  Psychiatric: Alert and oriented x 3.  Recent memory intact, remote memory unclear.  Normal mood and affect. Well groomed.  Good eye contact.  Cardiovascular: overall no swelling, no varicosities, no edema bilaterally, normal temperatures of the legs and arms, no clubbing, cyanosis and good capillary refill.  Lymphatic: palpation is normal.  All other systems reviewed and are negative   Neck has full ROM and no pain, no spasm.  NV intact.   Grips are normal.  The patient has been educated about the nature of the problem(s) and counseled on treatment options.  The patient appeared to understand what I have discussed and is in agreement with it.  Encounter Diagnosis  Name Primary?  . Cervicalgia Yes    PLAN Call if any problems.  Precautions discussed.  Continue current medications.   Return to clinic PRN   Electronically Signed Darreld Mclean, MD 10/18/20189:10 AM

## 2016-12-15 ENCOUNTER — Telehealth: Payer: Self-pay | Admitting: Orthopaedic Surgery

## 2016-12-15 MED ORDER — HYDROCODONE-ACETAMINOPHEN 5-325 MG PO TABS: 1.0000 | ORAL_TABLET | Freq: Four times a day (QID) | ORAL | 0 refills | Status: DC | PRN

## 2016-12-15 NOTE — Telephone Encounter (Signed)
Patient called for refill:  °HYDROcodone-acetaminophen (NORCO/VICODIN) 5-325 MG tablet 56 tablet  ° ° °

## 2016-12-17 ENCOUNTER — Telehealth: Payer: Self-pay | Admitting: *Deleted

## 2016-12-17 ENCOUNTER — Encounter: Payer: Self-pay | Admitting: Women's Health

## 2016-12-17 ENCOUNTER — Ambulatory Visit (INDEPENDENT_AMBULATORY_CARE_PROVIDER_SITE_OTHER): Payer: BLUE CROSS/BLUE SHIELD | Admitting: Women's Health

## 2016-12-17 VITALS — BP 120/80 | Ht 68.0 in | Wt 274.0 lb

## 2016-12-17 DIAGNOSIS — B373 Candidiasis of vulva and vagina: Secondary | ICD-10-CM | POA: Diagnosis not present

## 2016-12-17 DIAGNOSIS — N760 Acute vaginitis: Secondary | ICD-10-CM

## 2016-12-17 DIAGNOSIS — N912 Amenorrhea, unspecified: Secondary | ICD-10-CM

## 2016-12-17 DIAGNOSIS — N898 Other specified noninflammatory disorders of vagina: Secondary | ICD-10-CM | POA: Diagnosis not present

## 2016-12-17 DIAGNOSIS — B9689 Other specified bacterial agents as the cause of diseases classified elsewhere: Secondary | ICD-10-CM

## 2016-12-17 DIAGNOSIS — B3731 Acute candidiasis of vulva and vagina: Secondary | ICD-10-CM | POA: Insufficient documentation

## 2016-12-17 DIAGNOSIS — Z113 Encounter for screening for infections with a predominantly sexual mode of transmission: Secondary | ICD-10-CM | POA: Diagnosis not present

## 2016-12-17 DIAGNOSIS — R300 Dysuria: Secondary | ICD-10-CM | POA: Diagnosis not present

## 2016-12-17 LAB — WET PREP FOR TRICH, YEAST, CLUE

## 2016-12-17 LAB — PREGNANCY, URINE: PREG TEST UR: NEGATIVE

## 2016-12-17 MED ORDER — METRONIDAZOLE 500 MG PO TABS
500.0000 mg | ORAL_TABLET | Freq: Two times a day (BID) | ORAL | 0 refills | Status: DC
Start: 2016-12-17 — End: 2018-07-27

## 2016-12-17 MED ORDER — FLUCONAZOLE 100 MG PO TABS: ORAL_TABLET | ORAL | 0 refills | Status: DC

## 2016-12-17 NOTE — Progress Notes (Signed)
31 yo SWF G0 presents with complaints of vaginal itchiness and irritation with scant white discharge 2 days. Reports vaginal pruritus often occurs before period starts. Currently taking Loestrin birth control, regular monthly cycles. Urinary symptoms present including urinary frequency and dysuria mainly at the end of urination which became worse yesterday. Denies hematuria, abdominal pain, and fever. Has used boric acid suppository for vaginal pruritus with no relief of symptoms. Broke up with fianc 4 months ago but re-started relationship last week. History significant for narcotic addiction (in remission, attends meetings) and bipolar. 10/29/2016 Diflucan 150 times one dose was given with relief but symptoms have returned.  Exam: Appears well, obese. No CVAT. Abdomen soft and nontender. External genitalia appears extremely erythematous and irritated. Speculum exam white discharge present. Wet prep clue cells present., Many bacteria. GC/Chlamydia culture taken. UA: 2+ blood, negative nitrites, negative leukocyte esterase, 0-5 WBCs, 20-40 RBCs, 10-20 squamous epithelial cells, many bacteria. Urine HCG pregnancy: Negative  Bacterial vaginosis Recurrent Yeast vaginitis STD screen  Plan: Diflucan 100 mg po x 2 doses then take one tablet with cycle. . Counseled to take with food. Metronidazole 500 mg po bid x 2 weeks. Counseled to avoid alcohol while taking medication. Urine culture pending. Yeast prevention discussed. RPR, HIV, hepatitis C, hepatitis B, and GC/chlamydia results pending. Instructed to call or RTO if symptoms worsen or do not improve. Encouraged counseling.

## 2016-12-17 NOTE — Patient Instructions (Signed)
Bacterial Vaginosis Bacterial vaginosis is a vaginal infection that occurs when the normal balance of bacteria in the vagina is disrupted. It results from an overgrowth of certain bacteria. This is the most common vaginal infection among women ages 15-44. Because bacterial vaginosis increases your risk for STIs (sexually transmitted infections), getting treated can help reduce your risk for chlamydia, gonorrhea, herpes, and HIV (human immunodeficiency virus). Treatment is also important for preventing complications in pregnant women, because this condition can cause an early (premature) delivery. What are the causes? This condition is caused by an increase in harmful bacteria that are normally present in small amounts in the vagina. However, the reason that the condition develops is not fully understood. What increases the risk? The following factors may make you more likely to develop this condition:  Having a new sexual partner or multiple sexual partners.  Having unprotected sex.  Douching.  Having an intrauterine device (IUD).  Smoking.  Drug and alcohol abuse.  Taking certain antibiotic medicines.  Being pregnant.  You cannot get bacterial vaginosis from toilet seats, bedding, swimming pools, or contact with objects around you. What are the signs or symptoms? Symptoms of this condition include:  Grey or white vaginal discharge. The discharge can also be watery or foamy.  A fish-like odor with discharge, especially after sexual intercourse or during menstruation.  Itching in and around the vagina.  Burning or pain with urination.  Some women with bacterial vaginosis have no signs or symptoms. How is this diagnosed? This condition is diagnosed based on:  Your medical history.  A physical exam of the vagina.  Testing a sample of vaginal fluid under a microscope to look for a large amount of bad bacteria or abnormal cells. Your health care provider may use a cotton swab  or a small wooden spatula to collect the sample.  How is this treated? This condition is treated with antibiotics. These may be given as a pill, a vaginal cream, or a medicine that is put into the vagina (suppository). If the condition comes back after treatment, a second round of antibiotics may be needed. Follow these instructions at home: Medicines  Take over-the-counter and prescription medicines only as told by your health care provider.  Take or use your antibiotic as told by your health care provider. Do not stop taking or using the antibiotic even if you start to feel better. General instructions  If you have a female sexual partner, tell her that you have a vaginal infection. She should see her health care provider and be treated if she has symptoms. If you have a female sexual partner, he does not need treatment.  During treatment: ? Avoid sexual activity until you finish treatment. ? Do not douche. ? Avoid alcohol as directed by your health care provider. ? Avoid breastfeeding as directed by your health care provider.  Drink enough water and fluids to keep your urine clear or pale yellow.  Keep the area around your vagina and rectum clean. ? Wash the area daily with warm water. ? Wipe yourself from front to back after using the toilet.  Keep all follow-up visits as told by your health care provider. This is important. How is this prevented?  Do not douche.  Wash the outside of your vagina with warm water only.  Use protection when having sex. This includes latex condoms and dental dams.  Limit how many sexual partners you have. To help prevent bacterial vaginosis, it is best to have sex with just   one partner (monogamous).  Make sure you and your sexual partner are tested for STIs.  Wear cotton or cotton-lined underwear.  Avoid wearing tight pants and pantyhose, especially during summer.  Limit the amount of alcohol that you drink.  Do not use any products that  contain nicotine or tobacco, such as cigarettes and e-cigarettes. If you need help quitting, ask your health care provider.  Do not use illegal drugs. Where to find more information:  Centers for Disease Control and Prevention: www.cdc.gov/std  American Sexual Health Association (ASHA): www.ashastd.org  U.S. Department of Health and Human Services, Office on Women's Health: www.womenshealth.gov/ or https://www.womenshealth.gov/a-z-topics/bacterial-vaginosis Contact a health care provider if:  Your symptoms do not improve, even after treatment.  You have more discharge or pain when urinating.  You have a fever.  You have pain in your abdomen.  You have pain during sex.  You have vaginal bleeding between periods. Summary  Bacterial vaginosis is a vaginal infection that occurs when the normal balance of bacteria in the vagina is disrupted.  Because bacterial vaginosis increases your risk for STIs (sexually transmitted infections), getting treated can help reduce your risk for chlamydia, gonorrhea, herpes, and HIV (human immunodeficiency virus). Treatment is also important for preventing complications in pregnant women, because the condition can cause an early (premature) delivery.  This condition is treated with antibiotic medicines. These may be given as a pill, a vaginal cream, or a medicine that is put into the vagina (suppository). This information is not intended to replace advice given to you by your health care provider. Make sure you discuss any questions you have with your health care provider. Document Released: 01/20/2005 Document Revised: 10/06/2015 Document Reviewed: 10/06/2015 Elsevier Interactive Patient Education  2017 Elsevier Inc.  

## 2016-12-17 NOTE — Telephone Encounter (Signed)
Pt called and left message in triage voicemail requesting diflucan tablet. I called pt back and left message on her voicemail per nancy on 10/29/16. Pt will need to schedule OV.

## 2016-12-18 ENCOUNTER — Encounter: Payer: Self-pay | Admitting: Women's Health

## 2016-12-18 ENCOUNTER — Telehealth: Payer: Self-pay | Admitting: *Deleted

## 2016-12-18 ENCOUNTER — Encounter: Payer: Self-pay | Admitting: *Deleted

## 2016-12-18 LAB — HIV ANTIBODY (ROUTINE TESTING W REFLEX): HIV 1&2 Ab, 4th Generation: NONREACTIVE

## 2016-12-18 LAB — C. TRACHOMATIS/N. GONORRHOEAE RNA
C. TRACHOMATIS RNA, TMA: NOT DETECTED
N. GONORRHOEAE RNA, TMA: NOT DETECTED

## 2016-12-18 LAB — HEPATITIS C ANTIBODY
HEP C AB: NONREACTIVE
SIGNAL TO CUT-OFF: 0.01 (ref <1.00)

## 2016-12-18 LAB — HEPATITIS B SURFACE ANTIGEN: Hepatitis B Surface Ag: NONREACTIVE

## 2016-12-18 LAB — RPR: RPR Ser Ql: NONREACTIVE

## 2016-12-18 NOTE — Telephone Encounter (Signed)
FYI Pt was seen in OV yesterday treated with UTI and yeast. Pt missed worked today due to not feeling well. Taking Rx, just didn't feel good this am. I will type letter for missed work today due to illness.

## 2016-12-18 NOTE — Telephone Encounter (Signed)
Letter e-mailed to patient.

## 2016-12-18 NOTE — Telephone Encounter (Signed)
Ok thank you 

## 2016-12-19 LAB — URINALYSIS W MICROSCOPIC + REFLEX CULTURE
BILIRUBIN URINE: NEGATIVE
Glucose, UA: NEGATIVE
Hyaline Cast: NONE SEEN /LPF
KETONES UR: NEGATIVE
LEUKOCYTE ESTERASE: NEGATIVE
NITRITES URINE, INITIAL: NEGATIVE
PH: 5.5 (ref 5.0–8.0)
PROTEIN: NEGATIVE
Specific Gravity, Urine: 1.033 (ref 1.001–1.03)

## 2016-12-19 LAB — URINE CULTURE
MICRO NUMBER:: 81285848
SPECIMEN QUALITY: ADEQUATE

## 2016-12-19 LAB — NO CULTURE INDICATED

## 2017-01-22 ENCOUNTER — Telehealth: Payer: Self-pay | Admitting: Orthopaedic Surgery

## 2017-01-22 MED ORDER — HYDROCODONE-ACETAMINOPHEN 5-325 MG PO TABS: 1.0000 | ORAL_TABLET | Freq: Four times a day (QID) | ORAL | 0 refills | Status: DC | PRN

## 2017-01-22 NOTE — Telephone Encounter (Signed)
Patient requests refill on Hydrocodone/Acetaminophen 5-325  Mgs.   Qty  56  Sig: Take 1 tablet every 6 (six) hours as needed by mouth for moderate pain (Must last 14 days.Do not take and drive a car or use machinery.).  Patient uses [facility] in [place], [place]

## 2017-02-18 ENCOUNTER — Telehealth: Payer: Self-pay | Admitting: Orthopaedic Surgery

## 2017-02-18 ENCOUNTER — Other Ambulatory Visit: Payer: Self-pay | Admitting: Orthopaedic Surgery

## 2017-02-18 MED ORDER — HYDROCODONE-ACETAMINOPHEN 5-325 MG PO TABS: ORAL_TABLET | ORAL | 0 refills | Status: DC

## 2017-02-18 NOTE — Telephone Encounter (Signed)
Hydrocodone-Acetaminophen  5/325 mg Qty  56 Tablets  [facility]

## 2017-02-18 NOTE — Telephone Encounter (Signed)
Ibuprofen  800 mg   Qty  90 Tablets  [facility]

## 2017-03-16 ENCOUNTER — Telehealth: Payer: Self-pay | Admitting: Orthopaedic Surgery

## 2017-03-16 NOTE — Telephone Encounter (Signed)
Hydrocodone-Acetaminophen  5/325 mg  Qty  56 Tablets    PATIENT USES [facility] [address] [place]

## 2017-03-17 MED ORDER — HYDROCODONE-ACETAMINOPHEN 5-325 MG PO TABS: ORAL_TABLET | ORAL | 0 refills | Status: DC

## 2017-03-21 ENCOUNTER — Other Ambulatory Visit: Payer: Self-pay | Admitting: Women's Health

## 2017-03-21 DIAGNOSIS — B3731 Acute candidiasis of vulva and vagina: Secondary | ICD-10-CM

## 2017-03-21 DIAGNOSIS — B373 Candidiasis of vulva and vagina: Secondary | ICD-10-CM

## 2017-03-23 NOTE — Telephone Encounter (Signed)
Ok for refill? 

## 2017-04-06 ENCOUNTER — Telehealth: Payer: Self-pay | Admitting: Orthopaedic Surgery

## 2017-04-06 ENCOUNTER — Other Ambulatory Visit: Payer: Self-pay | Admitting: Women's Health

## 2017-04-06 NOTE — Telephone Encounter (Signed)
Patient called to request refill - aware that while Dr Hilda LiasKeeling is out of clinic until mid-May, Dr Romeo AppleHarrison is reviewing requests.  Patient scheduled appointment as well, as it has been over 3 months since last visit (11/20/16).   Pharmacy is [facility] in [place], ZOX:[id number]:Medication   HYDROcodone-acetaminophen (NORCO/VICODIN) 5-325 MG tablet 56 tablet 0 03/17/2017   Sig: One tablet every 6 hours as needed for pain. Must last 14 days.  Set  Earliest Fill Date: 03/17/2017

## 2017-04-06 NOTE — Telephone Encounter (Signed)
Rx called in 

## 2017-04-06 NOTE — Telephone Encounter (Signed)
Okay for refill?  

## 2017-04-07 ENCOUNTER — Other Ambulatory Visit: Payer: Self-pay | Admitting: Orthopedic Surgery

## 2017-04-07 MED ORDER — HYDROCODONE-ACETAMINOPHEN 5-325 MG PO TABS: ORAL_TABLET | ORAL | 0 refills | Status: DC

## 2017-04-13 ENCOUNTER — Emergency Department: Payer: BLUE CROSS/BLUE SHIELD

## 2017-04-13 ENCOUNTER — Emergency Department
Admission: EM | Admit: 2017-04-13 | Discharge: 2017-04-13 | Disposition: A | Discharge status: Home / Self Care | Payer: BLUE CROSS/BLUE SHIELD | Attending: Emergency Medicine | Admitting: Emergency Medicine

## 2017-04-13 ENCOUNTER — Other Ambulatory Visit: Payer: Self-pay

## 2017-04-13 ENCOUNTER — Encounter: Payer: Self-pay | Admitting: Emergency Medicine

## 2017-04-13 DIAGNOSIS — Y929 Unspecified place or not applicable: Secondary | ICD-10-CM | POA: Diagnosis not present

## 2017-04-13 DIAGNOSIS — Z79899 Other long term (current) drug therapy: Secondary | ICD-10-CM | POA: Diagnosis not present

## 2017-04-13 DIAGNOSIS — W548XXA Other contact with dog, initial encounter: Secondary | ICD-10-CM | POA: Diagnosis not present

## 2017-04-13 DIAGNOSIS — S92151A Displaced avulsion fracture (chip fracture) of right talus, initial encounter for closed fracture: Secondary | ICD-10-CM | POA: Diagnosis not present

## 2017-04-13 DIAGNOSIS — Y9389 Activity, other specified: Secondary | ICD-10-CM | POA: Diagnosis not present

## 2017-04-13 DIAGNOSIS — Y999 Unspecified external cause status: Secondary | ICD-10-CM | POA: Diagnosis not present

## 2017-04-13 DIAGNOSIS — S99911A Unspecified injury of right ankle, initial encounter: Secondary | ICD-10-CM | POA: Diagnosis present

## 2017-04-13 MED ORDER — IBUPROFEN 600 MG PO TABS: 600.0000 mg | ORAL_TABLET | Freq: Three times a day (TID) | ORAL | 0 refills | Status: DC | PRN

## 2017-04-13 MED ORDER — OXYCODONE-ACETAMINOPHEN 5-325 MG PO TABS: 1.0000 | ORAL_TABLET | Freq: Four times a day (QID) | ORAL | 0 refills | Status: DC | PRN

## 2017-04-13 MED ORDER — OXYCODONE-ACETAMINOPHEN 5-325 MG PO TABS
ORAL_TABLET | ORAL | Status: AC
  Filled 2017-04-13: qty 1

## 2017-04-13 MED ORDER — OXYCODONE-ACETAMINOPHEN 5-325 MG PO TABS
1.0000 | ORAL_TABLET | Freq: Once | ORAL | Status: AC
  Administered 2017-04-13: 1 via ORAL

## 2017-04-13 MED ORDER — ONDANSETRON 4 MG PO TBDP
4.0000 mg | ORAL_TABLET | Freq: Once | ORAL | Status: AC
Start: 2017-04-13 — End: 2017-04-13
  Administered 2017-04-13: 4 mg via ORAL
  Filled 2017-04-13: qty 1

## 2017-04-13 MED ORDER — IBUPROFEN 600 MG PO TABS
600.0000 mg | ORAL_TABLET | Freq: Once | ORAL | Status: AC
  Administered 2017-04-13: 600 mg via ORAL
  Filled 2017-04-13: qty 1

## 2017-04-13 NOTE — ED Triage Notes (Signed)
Pt states dog pulled her down and her ankle twisted backwards approx 0115. Swelling present, without bruising.

## 2017-04-13 NOTE — ED Provider Notes (Signed)
[facility] Emergency Department Provider Note  ____________________________________________   First MD Initiated Contact with Patient 04/13/17 [phone]     (approximate)  I have reviewed the triage vital signs and the nursing notes.   HISTORY  Chief Complaint Ankle Pain   HPI Catherine Mitchell is a 32 y.o. female 0 complaint of right ankle pain after her dog pulled her down yesterday.  Patient states that she twisted her ankle and has continued to have pain.  Pain is increased with weightbearing.  Patient had a previous injury to her right ankle and is present with a Cam walker in the room that she has been wearing for support.   She denies any head injury or loss of consciousness during her fall.  She rates her pain as 10/10.   Past Medical History:  Diagnosis Date  . Depression   . HSV (herpes simplex virus) anogenital infection   . Hypoglycemia     Patient Active Problem List   Diagnosis Date Noted  . Yeast vaginitis 12/17/2016  . H N P-CERVICAL 09/18/2008  . Sprain of neck 08/30/2008    Past Surgical History:  Procedure Laterality Date  . INTRAUTERINE DEVICE INSERTION  04/20/2013   ParaGard  . nexplanon removal  2015  . URETHRA SURGERY  had it stretched as a child    Prior to Admission medications   Medication Sig Start Date End Date Taking? Authorizing Provider  ALPRAZolam Prudy Feeler) 0.25 MG tablet TAKE 1 TABLET BY MOUTH AT BEDTIME AS NEEDED FOR ANXIETY 04/06/17   Harrington Challenger, NP  ibuprofen (ADVIL,MOTRIN) 600 MG tablet Take 1 tablet (600 mg total) by mouth every 8 (eight) hours as needed. 04/13/17   Tommi Rumps, PA-C  methylphenidate 36 MG PO CR tablet Take 36 mg by mouth daily.    [provider]  metroNIDAZOLE (FLAGYL) 500 MG tablet Take 1 tablet (500 mg total) 2 (two) times daily by mouth. 12/17/16   Harrington Challenger, NP  Multiple Vitamin (MULTIVITAMIN) capsule Take 1 capsule by mouth daily.    [provider]    NONFORMULARY OR COMPOUNDED ITEM boric acid 600 mg vaginal suppositories 1 per vagina twice weekly to prevent yeast infection 12/17/15   Fontaine, Nadyne Coombes, MD  norethindrone-ethinyl estradiol (JUNEL FE,GILDESS FE,LOESTRIN FE) 1-20 MG-MCG tablet Take 1 tablet by mouth daily. 08/18/16   Harrington Challenger, NP  oxyCODONE-acetaminophen (PERCOCET) 5-325 MG tablet Take 1 tablet by mouth every 6 (six) hours as needed for severe pain. 04/13/17   Tommi Rumps, PA-C  PROAIR HFA 108 (90 Base) MCG/ACT inhaler INHALE 1 PUFF INTO THE LUNGS EVERY 4-6 HOURS AS NEEDED FOR WHEEZING 01/27/17   [provider]  topiramate (TOPAMAX) 50 MG tablet Take 50 mg by mouth daily.    [provider]  venlafaxine XR (EFFEXOR-XR) 150 MG 24 hr capsule Take 1 capsule by mouth daily. 06/09/16   [provider]    Allergies Sulfur; Latex; and Tomato  Family History  Problem Relation Age of Onset  . Diabetes Mother   . Thyroid disease Mother   . Diabetes Maternal Grandmother   . Hypertension Father     Social History Social History   Tobacco Use  . Smoking status: Never Smoker  . Smokeless tobacco: Never Used  Substance Use Topics  . Alcohol use: Yes    Alcohol/week: 0.0 oz    Comment: occasional  . Drug use: No    Review of Systems Constitutional: No fever/chills  Eyes: No visual changes. ENT: No trauma Cardiovascular: Denies chest pain. Respiratory: Denies shortness of breath. Gastrointestinal: No abdominal pain.  No nausea, no vomiting.  Musculoskeletal: Positive for right ankle pain. Skin: Negative for abrasions or ecchymosis. Neurological: Negative for headaches, focal weakness or numbness. ____________________________________________   PHYSICAL EXAM:  VITAL SIGNS: ED Triage Vitals  Enc Vitals Group     BP 04/13/17 0239 121/71     Pulse Rate 04/13/17 0239 79     Resp 04/13/17 0239 16     Temp 04/13/17 0239 98.1 F (36.7 C)     Temp Source 04/13/17 0239 Oral     SpO2  04/13/17 0239 100 %     Weight 04/13/17 0239 230 lb (104.3 kg)     Height 04/13/17 0239 5\' 8"  (1.727 m)     Head Circumference --      Peak Flow --      Pain Score 04/13/17 0253 10     Pain Loc --      Pain Edu? --      Excl. in GC? --    Constitutional: Alert and oriented. Well appearing and in no acute distress. Eyes: Conjunctivae are normal.  Head: Atraumatic. Nose: No trauma. Neck: No stridor.   Cardiovascular: Normal rate, regular rhythm. Grossly normal heart sounds.  Good peripheral circulation. Respiratory: Normal respiratory effort.  No retractions. Lungs CTAB. Gastrointestinal: Soft and nontender. No distention. Musculoskeletal: Examination of the right ankle there is some soft tissue swelling present but no ecchymosis or abrasions seen.  Range of motion is restricted secondary to patient's pain.  Motor sensory function intact.  Capillary refill is less than 3 seconds. Neurologic:  Normal speech and language. No gross focal neurologic deficits are appreciated.  Gait was not tested secondary to patient's injury. Skin:  Skin is warm, dry and intact.  Psychiatric: Mood and affect are normal. Speech and behavior are normal.  ____________________________________________   LABS (all labs ordered are listed, but only abnormal results are displayed)  Labs Reviewed - No data to display ____________________________________________   RADIOLOGY  ED MD interpretation:   Right ankle exam is questionable for new or old avulsion fracture of the talus.  Official radiology report(s): Dg Ankle Complete Right  Result Date: 04/13/2017 CLINICAL DATA:  Twisting injury to the right ankle, with swelling and lateral malleolar pain. Initial encounter. EXAM: RIGHT ANKLE - COMPLETE 3+ VIEW COMPARISON:  Right ankle radiographs performed 06/12/2016 FINDINGS: New small osseous fragments along the dorsal aspect of the anterior talus are concerning for avulsion injuries. There is no additional  evidence of fracture. An ankle joint effusion is noted. The ankle mortise is intact; the interosseous space is within normal limits. No talar tilt or subluxation is seen. The joint spaces are preserved. No additional soft tissue abnormalities are seen. IMPRESSION: New small osseous fragments along the dorsal aspect of the anterior talus are concerning for avulsion injuries. Electronically Signed   By: Roanna RaiderJeffery  Chang M.D.   On: 04/13/2017 03:27    ____________________________________________   PROCEDURES  Procedure(s) performed: None  Procedures  Critical Care performed: No  ____________________________________________   INITIAL IMPRESSION / ASSESSMENT AND PLAN / ED COURSE  Patient was given Percocet while in triage and has had no difficulty with this.  She states that she will follow-up with [place] [place] [facility] [place].  She has her own cam walker which she will use.  She was discharged with a prescription for  Percocet 1 every 6 hours as needed for severe pain and ibuprofen 600 mg every 8 hours with food.  She is call make an appointment with the podiatrist in reasonable or Dr. Ether Griffins at [facility].  ____________________________________________   FINAL CLINICAL IMPRESSION(S) / ED DIAGNOSES  Final diagnoses:  Avulsion fracture of right talus, closed, initial encounter     ED Discharge Orders        Ordered    oxyCODONE-acetaminophen (PERCOCET) 5-325 MG tablet  Every 6 hours PRN     04/13/17 0728    ibuprofen (ADVIL,MOTRIN) 600 MG tablet  Every 8 hours PRN     04/13/17 0728       Note:  This document was prepared using Dragon voice recognition software and may include unintentional dictation errors.    Tommi Rumps, PA-C 04/13/17 1605    Emily Filbert, MD 04/14/17 [phone]

## 2017-04-13 NOTE — Discharge Instructions (Signed)
Follow-up with Dr. Ether GriffinsFowler or the podiatrist that you have seen in the past.  Continue wearing your cam walker boot until seen by the podiatrist.  Ice and elevation as needed for pain and swelling.  Percocet 1 every 6 hours as needed for severe pain.  Ibuprofen 600 mg every 8 hours with food.

## 2017-04-14 ENCOUNTER — Ambulatory Visit: Payer: BLUE CROSS/BLUE SHIELD | Admitting: Podiatry

## 2017-04-14 ENCOUNTER — Encounter: Payer: Self-pay | Admitting: Podiatry

## 2017-04-14 ENCOUNTER — Ambulatory Visit: Payer: BLUE CROSS/BLUE SHIELD | Admitting: Orthopedic Surgery

## 2017-04-14 DIAGNOSIS — S93401A Sprain of unspecified ligament of right ankle, initial encounter: Secondary | ICD-10-CM

## 2017-04-14 MED ORDER — MELOXICAM 15 MG PO TABS: 15.0000 mg | ORAL_TABLET | Freq: Every day | ORAL | 1 refills | Status: AC

## 2017-04-14 NOTE — Progress Notes (Signed)
   Subjective:    Patient ID: Catherine Mitchell, female    DOB: [birth year], 32 y.o.   MRN: [id number]  HPI    Review of Systems  All other systems reviewed and are negative.      Objective:   Physical Exam        Assessment & Plan:

## 2017-04-15 NOTE — Progress Notes (Signed)
   Subjective:  32 year old female presents as a new patient with a chief complaint of pain to the dorsal and anterior aspects of the right ankle that began one day ago secondary to an injury. She states she was walking and rolled her ankle. She was seen in the ED and had X-Rays done showing an effusion but no obvious fracture. She was prescribed Percocet 5/325 mg #12 and is requesting a refill. She was provided a CAM boot and crutches. Bearing weight increases the pain. Patient is here for further evaluation and treatment.   Past Medical History:  Diagnosis Date  . Depression   . HSV (herpes simplex virus) anogenital infection   . Hypoglycemia        Objective / Physical Exam:  General:  The patient is alert and oriented x3 in no acute distress. Dermatology:  Skin is warm, dry and supple bilateral lower extremities. Negative for open lesions or macerations. Vascular:  Palpable pedal pulses bilaterally. No edema or erythema noted. Capillary refill within normal limits. Neurological:  Epicritic and protective threshold grossly intact bilaterally.  Musculoskeletal Exam:  Pain on palpation to the anterior lateral medial aspects of the patient's right ankle. Mild edema noted. Range of motion within normal limits to all pedal and ankle joints bilateral. Muscle strength 5/5 in all groups bilateral.   Assessment: #1 pain in right ankle #2 right ankle sprain  Plan of Care:  #1 Patient was evaluated. X-Rays in Epic reviewed.  #2 Continue weightbearing in CAM boot.  #3 Prescription for Meloxicam provided to patient.  #4 Note for work provided. No work for 3 weeks.  #5 Return to clinic in 3 weeks.   Felecia ShellingBrent M. Evans, DPM [facility]  Dr. Felecia ShellingBrent M. Evans, DPM    [address]                                        [place], [place] [address]                Office [phone]  Fax [phone]

## 2017-04-16 ENCOUNTER — Telehealth: Payer: Self-pay | Admitting: Podiatry

## 2017-04-16 NOTE — Telephone Encounter (Signed)
I saw Dr. Logan BoresEvans on Tuesday and I was in the emergency department on Monday. I have ran out of my pain medication prescribed on 11 March. I was prescribed meloxicam but it has not taken away my pain. The pain is shooting up to my knee and my ankle is severely hurting. I've iced it, used ibuprofen, I'm taking the meloxicam, and I've even got some diclofenac rub that I've been putting on it. Will you please see if he will call my pain medicine in and get it refilled so that I can get through at least the next 2 weeks. You can call me back at [phone]. Thank you.

## 2017-04-16 NOTE — Telephone Encounter (Signed)
Dr. Logan BoresEvans, please advise of pain medication

## 2017-04-17 ENCOUNTER — Telehealth: Payer: Self-pay | Admitting: *Deleted

## 2017-04-17 MED ORDER — TRAMADOL HCL 50 MG PO TABS: 50.0000 mg | ORAL_TABLET | Freq: Three times a day (TID) | ORAL | 0 refills | Status: DC | PRN

## 2017-04-17 NOTE — Telephone Encounter (Signed)
04/16/2017 pt asked for pain medication. Today Dr. Logan BoresEvans states may order Tramadol 50 mg #30 one tablet every 8 hours, nothing stronger.

## 2017-04-20 ENCOUNTER — Telehealth: Payer: Self-pay | Admitting: Podiatry

## 2017-04-20 NOTE — Telephone Encounter (Signed)
I saw Dr. Logan BoresEvans last Tuesday and Friday he was not in the office so [facility] that Dr. Samuella CotaPrice prescribed which was Tramadol. The Tramadol has made me break out into hives so I've been having to take benadryl. The hives are finally getting better. Dr. Logan BoresEvans had prescribed me meloxicam which made me blow up so I was wondering if Dr. Samuella CotaPrice could prescribe me something else.

## 2017-04-21 ENCOUNTER — Telehealth: Payer: Self-pay | Admitting: Orthopedic Surgery

## 2017-04-21 NOTE — Telephone Encounter (Signed)
Thanks. She can take ibuprofen 800 mg TID. We cannot give her anything stronger.

## 2017-04-21 NOTE — Telephone Encounter (Signed)
Left message informing pt of Dr. Kandice HamsPrice's Ibuprofen 800mg  tid [facility] [address].

## 2017-04-21 NOTE — Telephone Encounter (Signed)
Called back to patient; scheduled accordingly. Patient aware. °

## 2017-04-21 NOTE — Telephone Encounter (Signed)
She has to be here earlier 30 min

## 2017-04-21 NOTE — Telephone Encounter (Signed)
Patient is presently scheduled for follow up neck pain appointment tomorrow, 04/22/17, has been seeing Dr Hilda LiasKeeling. She is requesting to be seen for new problem, avulsion fracture of right ankle, [facility]; relays she has been seen initially by [facility] - [facility].  Please advise regarding scheduled visit and changing to new problem for the 4:40 appointment time.

## 2017-04-22 ENCOUNTER — Ambulatory Visit (INDEPENDENT_AMBULATORY_CARE_PROVIDER_SITE_OTHER): Payer: BLUE CROSS/BLUE SHIELD | Admitting: Orthopedic Surgery

## 2017-04-22 ENCOUNTER — Telehealth: Payer: Self-pay | Admitting: Orthopaedic Surgery

## 2017-04-22 ENCOUNTER — Encounter: Payer: Self-pay | Admitting: Orthopedic Surgery

## 2017-04-22 VITALS — BP 124/83 | HR 83 | Ht 68.0 in | Wt 268.0 lb

## 2017-04-22 DIAGNOSIS — S92154A Nondisplaced avulsion fracture (chip fracture) of right talus, initial encounter for closed fracture: Secondary | ICD-10-CM

## 2017-04-22 DIAGNOSIS — M542 Cervicalgia: Secondary | ICD-10-CM

## 2017-04-22 MED ORDER — IBUPROFEN 600 MG PO TABS: 600.0000 mg | ORAL_TABLET | Freq: Three times a day (TID) | ORAL | 0 refills | Status: DC | PRN

## 2017-04-22 NOTE — Telephone Encounter (Addendum)
Patient was already told about her medication before she left the office.

## 2017-04-22 NOTE — Addendum Note (Signed)
Addended byCaffie Damme: LITTRELL, AMY W on: 04/22/2017 04:30 PM   Modules accepted: Orders

## 2017-04-22 NOTE — Patient Instructions (Addendum)
Weightbearing as tolerated in the cam walker  Please try some worksite adjustments in terms of your computer screen in the angle  What You Need to Know About Prescription Opioid Pain Medicine        Please be advised. You are on a medication which is classified as an "opiod". The CDC the [facility]  has recently advised all providers to advise patient's that these medications have certain risks which include but are not limited to:    drug intolerance  drug addiction  respiratory depression   respiratory failure  Death  Please keep these medications locked away. If you feel that you are becoming addicted to these medicines or you are having difficulties with these medications please alert your provider.   As your provider I will attempt to wean you off of these medications when you're severe acute pain has been taking care of. However, if we cannot wean you off of this medication you will be sent to a pain management center where they can better manage chronic pain   Opioids are powerful medicines that are used to treat moderate to severe pain. Opioids should be taken with the supervision of a trained health care provider. They should be taken for the shortest period of time as possible. This is because opioids can be addictive and the longer you take opioids, the greater your risk of addiction (opioid use disorder). What do opioids do? Opioids help reduce or eliminate pain. When used for short periods of time, they can help you:  Sleep better.  Do better in physical or occupational therapy.  Feel better in the first few days after an injury.  Recover from surgery. What kind of problems can opioids cause? Opioids can cause side effects, such as:  Constipation.  Nausea.  Vomiting.  Drowsiness.  Confusion.  Opioid use disorder.  Breathing difficulties (respiratory depression). Using opioid pain medicines for longer than 3 days increases your  risk of these side effects. Taking opioid pain medicine for a long period of time can affect your ability to do daily tasks. It also puts you at risk for:  Car accidents.  Heart attack.  Overdose, which can sometimes lead to death. What can increase my risk for developing problems while taking opioids? You may be at an especially high risk for problems while taking opioids if you:  Are over the age of 71.  Are pregnant.  Have kidney or liver disease.  Have certain mental health conditions, such as depression or anxiety.  Have a history of substance use disorder.  Have had an opioid overdose in the past. How do I stop taking opioids if I have been taking them for a long time? If you have been taking opioid medicine for more than a few weeks, you may need to slowly stop taking them (taper). Tapering your use of opioids can decrease your chances of experiencing withdrawal symptoms, such as:  Abdominal pain and cramping.  Nausea.  Sweating.  Sleepiness.  Restlessness.  Uncontrollable shaking (tremors).  Cravings for the medicine. Do not attempt to taper your use of opioids on your own. Talk with your health care provider about how to do this. Your health care provider may prescribe a step-down schedule based on how much medicine you are taking and how long you have been taking it. What are the benefits of stopping the use of opioids? By switching from opioid pain medicine to non-opioid pain management options, you will decrease your risk of accidents  and injuries associated with long-term opioid use. You will also be able to:  Monitor your pain more accurately and know when to seek medical care if it is not improving.  Decrease risk to others around you. Having opioids in the home increases the risk for accidental or intentional use or overdose by others. How can I treat pain without opioids? Pain can be managed with many types of alternative treatments. Ask your health care  provider to refer you to one or more specialists who can help you manage pain through:  Physical or occupational therapy.  Counseling (cognitive-behavioral therapy).  Good nutrition.  Biofeedback.  Massage.  Meditation.  Non-opioid medicine.  Following a gentle exercise program. Where can I get support? If you have been taking opioids for a long time, you may benefit from receiving support for quitting from a local support group or counselor. Ask your health care provider for a referral to these resources in your area. When should I seek medical care? Seek medical care right away if you are taking opioids and you experience any of the following:  Difficulty breathing.  Breathing that is more shallow or slower than normal.  A very slow heartbeat (pulse).  Severe confusion.  Unconsciousness.  Sleepiness.  Difficulty waking from sleep.  Slurred speech.  Nausea and vomiting.  Cold, clammy skin.  Blue lips or fingernails.  Limpness.  Abnormally small pupils. If you think that you or someone else may have taken too much of an opioid medicine, get medical help right away. Do not wait to see if the symptoms go away on their own. Call your local emergency services (911 in the [place].), or call the hotline of the [facility] 587 432 4251(614 825 1525 in the [place].).  Where can I get more information? To learn more about opioid medicines, visit the Centers for Disease Control and Prevention web site Opioid Basics at [employer].com.pthttps://www.cdc.gov/drugoverdos/opioids/index.html. Summary  Opioid medicines can help you manage moderate-to-severe pain for a short period of time.  Taking opioid pain medicine for a long period of time puts you at risk for unintentional accidents, injury, and even death.  If you think that you or someone else may have taken too much of an opioid, get medical help right away. This information is not intended to replace advice given to you by your health  care provider. Make sure you discuss any questions you have with your health care provider. Document Released: 02/16/2015 Document Revised: 09/14/2015 Document Reviewed: 09/01/2014 Elsevier Interactive Patient Education  2017 [employer]

## 2017-04-22 NOTE — Progress Notes (Signed)
Progress Note   Patient ID: Catherine Mitchell, female   DOB: 1985-07-16, 32 y.o.   MRN: [id number]  Chief Complaint  Patient presents with  . Neck Pain    improving   . Foot Pain    right foot pain ? fracture patient has boot     32 year old female followed for cervicalgia with epi with improvement but residual symptoms of pain stiffness aching.  She says she uses a computer at work and this may be contributing to her symptoms as she has not had any imaging that documents any major disc problems  In the interval since she had an appointment here for the next follow-up she fell and injured her right ankle.  That was injured about March 11 she slipped while taking her dog out for walk.  Slipped on wet grass.  History of prior ankle sprain.  Complains of dorsal foot pain right ankle 8 days swelling is associated with this and painful range of motion she has been able to weight-bear in a Cam walker     Review of Systems  Constitutional: Negative for chills and fever.  Musculoskeletal: Positive for falls, joint pain, myalgias and neck pain.   Current Meds  Medication Sig  . ALPRAZolam (XANAX) 0.25 MG tablet TAKE 1 TABLET BY MOUTH AT BEDTIME AS NEEDED FOR ANXIETY  . ibuprofen (ADVIL,MOTRIN) 600 MG tablet Take 1 tablet (600 mg total) by mouth every 8 (eight) hours as needed.  . methylphenidate 36 MG PO CR tablet Take 36 mg by mouth daily.  . metroNIDAZOLE (FLAGYL) 500 MG tablet Take 1 tablet (500 mg total) 2 (two) times daily by mouth.  . Multiple Vitamin (MULTIVITAMIN) capsule Take 1 capsule by mouth daily.  . norethindrone-ethinyl estradiol (JUNEL FE,GILDESS FE,LOESTRIN FE) 1-20 MG-MCG tablet Take 1 tablet by mouth daily.  Marland Kitchen PROAIR HFA 108 (90 Base) MCG/ACT inhaler INHALE 1 PUFF INTO THE LUNGS EVERY 4-6 HOURS AS NEEDED FOR WHEEZING  . topiramate (TOPAMAX) 50 MG tablet Take 50 mg by mouth daily.  Marland Kitchen venlafaxine XR (EFFEXOR-XR) 150 MG 24 hr capsule Take 1 capsule by mouth daily.    Past  Medical History:  Diagnosis Date  . Depression   . HSV (herpes simplex virus) anogenital infection   . Hypoglycemia      Allergies  Allergen Reactions  . Sulfur Other (See Comments)    Throat swells,blisters  . Latex     BREAKS OUT HANDS  . Tomato     GI UPSET    BP 124/83   Pulse 83   Ht 5\' 8"  (1.727 m)   Wt 268 lb (121.6 kg)   LMP 04/12/2017 (Exact Date)   BMI 40.75 kg/m    Physical Exam  Constitutional: She is oriented to person, place, and time. She appears well-developed and well-nourished.  Musculoskeletal:       Back:       Feet:  Neurological: She is alert and oriented to person, place, and time. Gait abnormal.  Grip strength elbow flexion extension shoulder abduction right and left normal grade 5 muscle testing  Reflexes 2+ and equal bilaterally    Psychiatric: She has a normal mood and affect. Judgment normal.  Vitals reviewed.   Ortho Exam   Medical decision-making  Imaging: Ankle x-ray right ankle dated March 11 3 views she does have these osseous fragments that look to be new and avulsions near the talus  EXAM: RIGHT ANKLE - COMPLETE 3+ VIEW   COMPARISON:  Right ankle radiographs  performed 06/12/2016   FINDINGS: New small osseous fragments along the dorsal aspect of the anterior talus are concerning for avulsion injuries.   There is no additional evidence of fracture. An ankle joint effusion is noted. The ankle mortise is intact; the interosseous space is within normal limits. No talar tilt or subluxation is seen.   The joint spaces are preserved. No additional soft tissue abnormalities are seen.   IMPRESSION: New small osseous fragments along the dorsal aspect of the anterior talus are concerning for avulsion injuries.     Electronically Signed   By: Roanna RaiderJeffery  Chang M.D.   On: 04/13/2017 03:27   Encounter Diagnoses  Name Primary?  . Cervicalgia   . Closed nondisplaced avulsion fracture of right talus, initial encounter Yes        Meds ordered this encounter  Medications  . ibuprofen (ADVIL,MOTRIN) 600 MG tablet    Sig: Take 1 tablet (600 mg total) by mouth every 8 (eight) hours as needed.    Dispense:  90 tablet    Refill:  0    Patient asked for prescription databank checked she has had a recent prescription of tramadol which will run out on the 26.  We cannot order anything until that is run its course.  If we need to refill then we can refill medication at that time although I do not think it would be necessary based on the pathology seen.  Although we do note that she has been receiving medication for some time now.  I did give her opioid warning     Fuller CanadaStanley Nyana Haren, MD 04/22/2017 3:55 PM

## 2017-04-23 ENCOUNTER — Telehealth: Payer: Self-pay | Admitting: Orthopedic Surgery

## 2017-04-23 NOTE — Telephone Encounter (Signed)
Patient is asking for a refill of Hydrocodone. She stated Dr. Hilda LiasKeeling prescribed it to her before.  States her ankle is really hurting bad.  [facility]

## 2017-04-23 NOTE — Telephone Encounter (Signed)
Dr Romeo AppleHarrison advised yesterday he can not write this until Tuesday because of other narcotics from other providers.   I advised her yesterday, advised her again today  Will send note to Dr Romeo AppleHarrison to send in for her on 04/28/17, she has asked me to do this. I told her will send him the request.

## 2017-04-27 NOTE — Telephone Encounter (Signed)
Patient has requested Hydrocodone You stated could not fill until 04/28/17

## 2017-04-28 NOTE — Telephone Encounter (Signed)
Patient called back and needs to speak with staff regarding decline of refill request.

## 2017-04-28 NOTE — Telephone Encounter (Signed)
Declined shes getting from multiple doctors  Why?

## 2017-04-28 NOTE — Telephone Encounter (Signed)
I have advised patient she is upset I listened to her as she has voiced her displeasure. I told her Dr Romeo AppleHarrison is not comfortable prescribing medication for her, as she has gotten narcotics from multiple providers. She states she can not wait for Dr Hilda LiasKeeling to return. I have advised her he returns in May.

## 2017-05-04 ENCOUNTER — Telehealth: Payer: Self-pay | Admitting: Orthopaedic Surgery

## 2017-05-04 NOTE — Telephone Encounter (Signed)
Patient called again about getting a refill of Hydrocodone . She states her ankle is hurting her real bad. She stated this is a new week and maybe things are different.  SHE USES [facility]  Please call and advise

## 2017-05-05 ENCOUNTER — Ambulatory Visit: Payer: Self-pay | Admitting: Podiatry

## 2017-05-07 ENCOUNTER — Other Ambulatory Visit: Payer: Self-pay | Admitting: Orthopedic Surgery

## 2017-05-07 MED ORDER — NABUMETONE 500 MG PO TABS: 500.0000 mg | ORAL_TABLET | Freq: Two times a day (BID) | ORAL | 5 refills | Status: DC

## 2017-05-07 NOTE — Telephone Encounter (Signed)
Relafen 500 bid I sent
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
## 2017-05-07 NOTE — Telephone Encounter (Signed)
I agree also, I have advised patient is there any other NSAID you could recommend since she indicates Ibuprofen not helping?
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
## 2017-05-08 ENCOUNTER — Telehealth: Payer: Self-pay | Admitting: Orthopedic Surgery

## 2017-05-08 NOTE — Telephone Encounter (Signed)
I called patient advised her to d/c Ibuprofen and take the Relafen bid. Was very nice.

## 2017-05-08 NOTE — Telephone Encounter (Signed)
Called back to patient regarding release of records process; patient will need to sign authorization release of information form.  Reached voice mail; left message to return call.
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
## 2017-05-08 NOTE — Addendum Note (Signed)
Addended byCaffie Damme: LITTRELL, AMY W on: 05/08/2017 08:02 AM   Modules accepted: Orders

## 2017-05-11 NOTE — Telephone Encounter (Signed)
No return call from patient regarding records request/signing of authorization.  Cancelled appointment for 05/13/17 as per patient's request per voice message as noted.

## 2017-05-13 ENCOUNTER — Ambulatory Visit: Payer: Self-pay | Admitting: Orthopedic Surgery
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
## 2017-07-08 ENCOUNTER — Ambulatory Visit: Payer: Self-pay | Admitting: Orthopaedic Surgery

## 2017-07-15 ENCOUNTER — Ambulatory Visit (INDEPENDENT_AMBULATORY_CARE_PROVIDER_SITE_OTHER): Payer: BLUE CROSS/BLUE SHIELD

## 2017-07-15 ENCOUNTER — Ambulatory Visit (INDEPENDENT_AMBULATORY_CARE_PROVIDER_SITE_OTHER): Payer: BLUE CROSS/BLUE SHIELD | Admitting: Orthopaedic Surgery

## 2017-07-15 ENCOUNTER — Encounter: Payer: Self-pay | Admitting: Orthopaedic Surgery

## 2017-07-15 VITALS — BP 131/83 | HR 83 | Ht 68.0 in | Wt 266.0 lb

## 2017-07-15 DIAGNOSIS — M542 Cervicalgia: Secondary | ICD-10-CM

## 2017-07-15 MED ORDER — HYDROCODONE-ACETAMINOPHEN 5-325 MG PO TABS: ORAL_TABLET | ORAL | 0 refills | Status: DC

## 2017-07-15 MED ORDER — NAPROXEN 500 MG PO TABS: 500.0000 mg | ORAL_TABLET | Freq: Two times a day (BID) | ORAL | 5 refills | Status: DC

## 2017-07-15 NOTE — Progress Notes (Signed)
Patient Catherine Mitchell:[name], female DOB:21-Dec-1985, 32 y.o. AVW:[id number]  Chief Complaint  Patient presents with  . Follow-up    neck    HPI  [name] is a 32 y.o. female who has chronic pain of the cervical spine.  She has difficulty moving her neck to the right, she has pain of the right upper trapezius, she has difficulty sleeping at night.  She is tired of hurting.  She has had PT.  She has been on Relafen, ibuprofen 800.  She has tried rubs.  She has no numbness. HPI  Body mass index is 40.45 kg/m.  ROS  Review of Systems  HENT: Negative for congestion.   Respiratory: Negative for shortness of breath and stridor.   Cardiovascular: Negative for chest pain and leg swelling.  Endocrine: Positive for cold intolerance.  Musculoskeletal: Positive for arthralgias, back pain, myalgias and neck pain.  Allergic/Immunologic: Positive for environmental allergies.    Past Medical History:  Diagnosis Date  . Depression   . HSV (herpes simplex virus) anogenital infection   . Hypoglycemia     Past Surgical History:  Procedure Laterality Date  . INTRAUTERINE DEVICE INSERTION  04/20/2013   ParaGard  . nexplanon removal  2015  . URETHRA SURGERY  had it stretched as a child    Family History  Problem Relation Age of Onset  . Diabetes Mother   . Thyroid disease Mother   . Diabetes Maternal Grandmother   . Hypertension Father     Social History Social History   Tobacco Use  . Smoking status: Never Smoker  . Smokeless tobacco: Never Used  Substance Use Topics  . Alcohol use: Yes    Alcohol/week: 0.0 oz    Comment: occasional  . Drug use: No    Allergies  Allergen Reactions  . Sulfur Other (See Comments)    Throat swells,blisters  . Latex     BREAKS OUT HANDS  . Tomato     GI UPSET    Current Outpatient Medications  Medication Sig Dispense Refill  . ALPRAZolam (XANAX) 0.25 MG tablet TAKE 1 TABLET BY MOUTH AT BEDTIME AS NEEDED FOR ANXIETY 30 tablet 0   . HYDROcodone-acetaminophen (NORCO/VICODIN) 5-325 MG tablet One tablet every four hours for pain. 30 tablet 0  . methylphenidate 36 MG PO CR tablet Take 36 mg by mouth daily.    . metroNIDAZOLE (FLAGYL) 500 MG tablet Take 1 tablet (500 mg total) 2 (two) times daily by mouth. 14 tablet 0  . Multiple Vitamin (MULTIVITAMIN) capsule Take 1 capsule by mouth daily.    . naproxen (NAPROSYN) 500 MG tablet Take 1 tablet (500 mg total) by mouth 2 (two) times daily with a meal. 60 tablet 5  . NONFORMULARY OR COMPOUNDED ITEM boric acid 600 mg vaginal suppositories 1 per vagina twice weekly to prevent yeast infection (Patient not taking: Reported on 04/14/2017) 30 each 1  . norethindrone-ethinyl estradiol (JUNEL FE,GILDESS FE,LOESTRIN FE) 1-20 MG-MCG tablet Take 1 tablet by mouth daily. 3 Package 1  . PROAIR HFA 108 (90 Base) MCG/ACT inhaler INHALE 1 PUFF INTO THE LUNGS EVERY 4-6 HOURS AS NEEDED FOR WHEEZING  0  . topiramate (TOPAMAX) 50 MG tablet Take 50 mg by mouth daily.    Marland Kitchen. venlafaxine XR (EFFEXOR-XR) 150 MG 24 hr capsule Take 1 capsule by mouth daily.  2   No current facility-administered medications for this visit.      Physical Exam  Blood pressure 131/83, pulse 83, height 5\' 8"  (1.727  m), weight 266 lb (120.7 kg).  Constitutional: overall normal hygiene, normal nutrition, well developed, normal grooming, normal body habitus. Assistive device:none  Musculoskeletal: gait and station Limp none, muscle tone and strength are normal, no tremors or atrophy is present.  .  Neurological: coordination overall normal.  Deep tendon reflex/nerve stretch intact.  Sensation normal.  Cranial nerves II-XII intact.   Skin:   Normal overall no scars, lesions, ulcers or rashes. No psoriasis.  Psychiatric: Alert and oriented x 3.  Recent memory intact, remote memory unclear.  Normal mood and affect. Well groomed.  Good eye contact.  Cardiovascular: overall no swelling, no varicosities, no edema bilaterally,  normal temperatures of the legs and arms, no clubbing, cyanosis and good capillary refill.  Lymphatic: palpation is normal.  Neck has pain on the right side, no spasm.  Right upper trapezius tender.  ROM of the shoulders is full.  Grips are normal.  NV intact.  All other systems reviewed and are negative   The patient has been educated about the nature of the problem(s) and counseled on treatment options.  The patient appeared to understand what I have discussed and is in agreement with it.  X-rays were done of the cervical spine, reported separately.  Encounter Diagnosis  Name Primary?  . Cervicalgia Yes  I would like to get a MRI of the cervical spine.  She has not improved with conservative treatments.  PLAN Call if any problems.  Precautions discussed.  Continue current medications.   Return to clinic after MRI of the cervical spine.   I have reviewed the [place] Controlled Substance Reporting System web site prior to prescribing narcotic medicine for this patient.  Electronically Signed Darreld Mclean, MD 6/12/201911:36 AM

## 2017-07-17 ENCOUNTER — Ambulatory Visit (HOSPITAL_COMMUNITY): Admission: RE | Admit: 2017-07-17 | Payer: BLUE CROSS/BLUE SHIELD | Source: Ambulatory Visit

## 2017-07-21 ENCOUNTER — Ambulatory Visit: Payer: BLUE CROSS/BLUE SHIELD | Admitting: Orthopaedic Surgery

## 2017-07-26 ENCOUNTER — Telehealth: Payer: Self-pay | Admitting: Orthopaedic Surgery

## 2017-07-27 ENCOUNTER — Other Ambulatory Visit: Payer: Self-pay | Admitting: Orthopaedic Surgery

## 2017-07-27 NOTE — Telephone Encounter (Signed)
Patient no showed for her MRI appointment

## 2017-07-28 MED ORDER — HYDROCODONE-ACETAMINOPHEN 5-325 MG PO TABS: ORAL_TABLET | ORAL | 0 refills | Status: DC

## 2017-07-28 NOTE — Telephone Encounter (Signed)
She did not show for MRI
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
## 2017-08-03 ENCOUNTER — Other Ambulatory Visit: Payer: Self-pay | Admitting: Orthopaedic Surgery

## 2017-08-03 MED ORDER — HYDROCODONE-ACETAMINOPHEN 5-325 MG PO TABS: ORAL_TABLET | ORAL | 0 refills | Status: DC

## 2017-08-10 ENCOUNTER — Other Ambulatory Visit: Payer: Self-pay | Admitting: Orthopaedic Surgery

## 2017-08-11 MED ORDER — HYDROCODONE-ACETAMINOPHEN 5-325 MG PO TABS: ORAL_TABLET | ORAL | 0 refills | Status: DC

## 2017-08-12 ENCOUNTER — Ambulatory Visit
Admission: RE | Admit: 2017-08-12 | Discharge: 2017-08-12 | Disposition: A | Discharge status: Home / Self Care | Payer: BLUE CROSS/BLUE SHIELD | Source: Ambulatory Visit | Attending: Orthopaedic Surgery | Admitting: Orthopaedic Surgery

## 2017-08-12 DIAGNOSIS — M4802 Spinal stenosis, cervical region: Secondary | ICD-10-CM | POA: Diagnosis not present

## 2017-08-12 DIAGNOSIS — M50222 Other cervical disc displacement at C5-C6 level: Secondary | ICD-10-CM | POA: Diagnosis not present

## 2017-08-12 DIAGNOSIS — M542 Cervicalgia: Secondary | ICD-10-CM | POA: Diagnosis present

## 2017-08-13 ENCOUNTER — Ambulatory Visit: Payer: BLUE CROSS/BLUE SHIELD

## 2017-08-17 ENCOUNTER — Other Ambulatory Visit: Payer: Self-pay | Admitting: Orthopaedic Surgery

## 2017-08-18 ENCOUNTER — Encounter: Payer: Self-pay | Admitting: Orthopaedic Surgery

## 2017-08-18 ENCOUNTER — Other Ambulatory Visit: Payer: Self-pay | Admitting: Orthopaedic Surgery

## 2017-08-18 ENCOUNTER — Ambulatory Visit (INDEPENDENT_AMBULATORY_CARE_PROVIDER_SITE_OTHER): Payer: BLUE CROSS/BLUE SHIELD | Admitting: Orthopaedic Surgery

## 2017-08-18 VITALS — BP 104/65 | HR 73 | Ht 68.0 in | Wt 267.0 lb

## 2017-08-18 DIAGNOSIS — M542 Cervicalgia: Secondary | ICD-10-CM

## 2017-08-18 MED ORDER — HYDROCODONE-ACETAMINOPHEN 5-325 MG PO TABS: ORAL_TABLET | ORAL | 0 refills | Status: DC

## 2017-08-18 NOTE — Patient Instructions (Signed)
..  The referral form and notes were faxed to [facility] for scheduling.  The patient was told to expect their call.

## 2017-08-18 NOTE — Telephone Encounter (Signed)
done

## 2017-08-18 NOTE — Progress Notes (Signed)
Patient ZO:XWRUE:Catherine Mitchell, female DOB:12-18-1985, 32 y.o. AVW:[id number]  Chief Complaint  Patient presents with  . Follow-up    Cervical MRI results     HPI  Catherine Mitchell is a 32 y.o. female who has neck pain.  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  Broad-based disc protrusion and spurring at C6-7 with progressive spinal and foraminal stenosis.  I have explained the findings to her.  I would like for her to see a neurosurgeon to see if she is a candidate for epidurals in the neck.  She has been to PT, taken NSAIDs, done exercises and still has neck pain.   Body mass index is 40.6 kg/m.  ROS  Review of Systems  All other systems reviewed and are negative.  Past Medical History:  Diagnosis Date  . Depression   . HSV (herpes simplex virus) anogenital infection   . Hypoglycemia     Past Surgical History:  Procedure Laterality Date  . INTRAUTERINE DEVICE INSERTION  04/20/2013   ParaGard  . nexplanon removal  2015  . URETHRA SURGERY  had it stretched as a child    Family History  Problem Relation Age of Onset  . Diabetes Mother   . Thyroid disease Mother   . Diabetes Maternal Grandmother   . Hypertension Father     Social History Social History   Tobacco Use  . Smoking status: Never Smoker  . Smokeless tobacco: Never Used  Substance Use Topics  . Alcohol use: Yes    Alcohol/week: 0.0 oz    Comment: occasional  . Drug use: No    Allergies  Allergen Reactions  . Sulfur Other (See Comments)    Throat swells,blisters  . Latex     BREAKS OUT HANDS  . Tomato     GI UPSET    Current Outpatient Medications  Medication Sig Dispense Refill  . ALPRAZolam (XANAX) 0.25 MG tablet TAKE 1 TABLET BY MOUTH AT BEDTIME AS NEEDED FOR ANXIETY 30 tablet 0  . HYDROcodone-acetaminophen (NORCO/VICODIN) 5-325 MG tablet One tablet every four hours for pain. 7 day limit 30 tablet 0   . methylphenidate 36 MG PO CR tablet Take 36 mg by mouth daily.    . metroNIDAZOLE (FLAGYL) 500 MG tablet Take 1 tablet (500 mg total) 2 (two) times daily by mouth. 14 tablet 0  . Multiple Vitamin (MULTIVITAMIN) capsule Take 1 capsule by mouth daily.    . naproxen (NAPROSYN) 500 MG tablet Take 1 tablet (500 mg total) by mouth 2 (two) times daily with a meal. 60 tablet 5  . NONFORMULARY OR COMPOUNDED ITEM boric acid 600 mg vaginal suppositories 1 per vagina twice weekly to prevent yeast infection (Patient not taking: Reported on 04/14/2017) 30 each 1  . norethindrone-ethinyl estradiol (JUNEL FE,GILDESS FE,LOESTRIN FE) 1-20 MG-MCG tablet Take 1 tablet by mouth daily. 3 Package 1  . PROAIR HFA 108 (90 Base) MCG/ACT inhaler INHALE 1 PUFF INTO THE LUNGS EVERY 4-6 HOURS AS NEEDED FOR WHEEZING  0  . topiramate (TOPAMAX) 50 MG tablet Take 50 mg by mouth daily.    Marland Kitchen. venlafaxine XR (EFFEXOR-XR) 150 MG 24 hr capsule Take 1 capsule by mouth daily.  2   No current facility-administered medications for this visit.      Physical Exam  Blood pressure 104/65, pulse 73, height 5\' 8"  (1.727 m), weight 267 lb (121.1 kg).  Constitutional: overall normal hygiene, normal nutrition, well developed,  normal grooming, normal body habitus. Assistive device:none  Musculoskeletal: gait and station Limp none, muscle tone and strength are normal, no tremors or atrophy is present.  .  Neurological: coordination overall normal.  Deep tendon reflex/nerve stretch intact.  Sensation normal.  Cranial nerves II-XII intact.   Skin:   Normal overall no scars, lesions, ulcers or rashes. No psoriasis.  Psychiatric: Alert and oriented x 3.  Recent memory intact, remote memory unclear.  Normal mood and affect. Well groomed.  Good eye contact.  Cardiovascular: overall no swelling, no varicosities, no edema bilaterally, normal temperatures of the legs and arms, no clubbing, cyanosis and good capillary refill.  Lymphatic: palpation  is normal.  Neck is tender but has full motion.  NV intact.  All other systems reviewed and are negative   The patient has been educated about the nature of the problem(s) and counseled on treatment options.  The patient appeared to understand what I have discussed and is in agreement with it.  Encounter Diagnosis  Name Primary?  . Cervicalgia Yes    PLAN Call if any problems.  Precautions discussed.  Continue current medications.   Return to clinic 1 month   To see neurosurgeon.  Electronically Signed Darreld Mclean, MD 7/16/20199:43 AM

## 2017-08-21 ENCOUNTER — Other Ambulatory Visit: Payer: Self-pay | Admitting: Orthopaedic Surgery

## 2017-08-25 ENCOUNTER — Other Ambulatory Visit: Payer: Self-pay | Admitting: Orthopaedic Surgery

## 2017-08-25 NOTE — Telephone Encounter (Signed)
Patient requests refill on Hydrocodone/Acetaminophen 5-325 mgs.  Qty  30  Sig: One tablet every four hours for pain. 7 day limit         Patient states she uses [facility] on [address] [place], [place]

## 2017-08-26 MED ORDER — HYDROCODONE-ACETAMINOPHEN 5-325 MG PO TABS: ORAL_TABLET | ORAL | 0 refills | Status: DC

## 2017-08-26 NOTE — Telephone Encounter (Signed)
done

## 2017-09-01 ENCOUNTER — Other Ambulatory Visit: Payer: Self-pay | Admitting: Orthopaedic Surgery

## 2017-09-01 MED ORDER — HYDROCODONE-ACETAMINOPHEN 5-325 MG PO TABS: ORAL_TABLET | ORAL | 0 refills | Status: DC

## 2017-09-08 ENCOUNTER — Other Ambulatory Visit: Payer: Self-pay | Admitting: Orthopaedic Surgery

## 2017-09-08 MED ORDER — HYDROCODONE-ACETAMINOPHEN 5-325 MG PO TABS: ORAL_TABLET | ORAL | 0 refills | Status: DC

## 2017-09-15 ENCOUNTER — Other Ambulatory Visit: Payer: Self-pay

## 2017-09-15 MED ORDER — HYDROCODONE-ACETAMINOPHEN 5-325 MG PO TABS: ORAL_TABLET | ORAL | 0 refills | Status: DC

## 2017-09-17 ENCOUNTER — Ambulatory Visit: Payer: BLUE CROSS/BLUE SHIELD | Admitting: Orthopaedic Surgery

## 2017-09-21 ENCOUNTER — Other Ambulatory Visit: Payer: Self-pay

## 2017-09-22 ENCOUNTER — Other Ambulatory Visit: Payer: Self-pay

## 2017-09-22 MED ORDER — HYDROCODONE-ACETAMINOPHEN 5-325 MG PO TABS: ORAL_TABLET | ORAL | 0 refills | Status: DC

## 2017-09-22 NOTE — Telephone Encounter (Signed)
I did this earlier today

## 2017-09-29 ENCOUNTER — Other Ambulatory Visit: Payer: Self-pay

## 2017-09-29 MED ORDER — HYDROCODONE-ACETAMINOPHEN 5-325 MG PO TABS: ORAL_TABLET | ORAL | 0 refills | Status: DC

## 2017-09-30 ENCOUNTER — Ambulatory Visit: Payer: BLUE CROSS/BLUE SHIELD | Admitting: Orthopaedic Surgery

## 2017-10-01 ENCOUNTER — Ambulatory Visit: Payer: BLUE CROSS/BLUE SHIELD | Admitting: Orthopaedic Surgery

## 2017-10-06 ENCOUNTER — Other Ambulatory Visit: Payer: Self-pay

## 2017-10-06 MED ORDER — HYDROCODONE-ACETAMINOPHEN 5-325 MG PO TABS: ORAL_TABLET | ORAL | 0 refills | Status: DC

## 2017-10-12 ENCOUNTER — Other Ambulatory Visit: Payer: Self-pay

## 2017-10-12 MED ORDER — HYDROCODONE-ACETAMINOPHEN 5-325 MG PO TABS: ORAL_TABLET | ORAL | 0 refills | Status: DC

## 2017-10-19 ENCOUNTER — Other Ambulatory Visit: Payer: Self-pay

## 2017-10-19 MED ORDER — HYDROCODONE-ACETAMINOPHEN 5-325 MG PO TABS: ORAL_TABLET | ORAL | 0 refills | Status: DC

## 2017-10-26 ENCOUNTER — Other Ambulatory Visit: Payer: Self-pay

## 2017-10-26 MED ORDER — HYDROCODONE-ACETAMINOPHEN 5-325 MG PO TABS: ORAL_TABLET | ORAL | 0 refills | Status: DC

## 2017-11-02 ENCOUNTER — Other Ambulatory Visit: Payer: Self-pay

## 2017-11-03 MED ORDER — HYDROCODONE-ACETAMINOPHEN 5-325 MG PO TABS: ORAL_TABLET | ORAL | 0 refills | Status: DC

## 2017-11-09 ENCOUNTER — Other Ambulatory Visit: Payer: Self-pay

## 2017-11-10 MED ORDER — HYDROCODONE-ACETAMINOPHEN 5-325 MG PO TABS: ORAL_TABLET | ORAL | 0 refills | Status: DC

## 2017-11-16 ENCOUNTER — Other Ambulatory Visit: Payer: Self-pay

## 2017-11-17 MED ORDER — HYDROCODONE-ACETAMINOPHEN 5-325 MG PO TABS: ORAL_TABLET | ORAL | 0 refills | Status: DC

## 2017-11-23 ENCOUNTER — Other Ambulatory Visit: Payer: Self-pay

## 2017-11-23 MED ORDER — HYDROCODONE-ACETAMINOPHEN 5-325 MG PO TABS: ORAL_TABLET | ORAL | 0 refills | Status: DC

## 2017-11-30 ENCOUNTER — Other Ambulatory Visit: Payer: Self-pay

## 2017-12-01 MED ORDER — HYDROCODONE-ACETAMINOPHEN 5-325 MG PO TABS: ORAL_TABLET | ORAL | 0 refills | Status: DC

## 2017-12-07 ENCOUNTER — Other Ambulatory Visit: Payer: Self-pay

## 2017-12-08 MED ORDER — HYDROCODONE-ACETAMINOPHEN 5-325 MG PO TABS: ORAL_TABLET | ORAL | 0 refills | Status: DC

## 2017-12-14 ENCOUNTER — Other Ambulatory Visit: Payer: Self-pay

## 2017-12-15 MED ORDER — HYDROCODONE-ACETAMINOPHEN 5-325 MG PO TABS: ORAL_TABLET | ORAL | 0 refills | Status: DC

## 2017-12-21 ENCOUNTER — Other Ambulatory Visit: Payer: Self-pay

## 2017-12-21 MED ORDER — HYDROCODONE-ACETAMINOPHEN 5-325 MG PO TABS: ORAL_TABLET | ORAL | 0 refills | Status: DC

## 2017-12-27 ENCOUNTER — Other Ambulatory Visit: Payer: Self-pay

## 2017-12-28 MED ORDER — HYDROCODONE-ACETAMINOPHEN 5-325 MG PO TABS: ORAL_TABLET | ORAL | 0 refills | Status: DC

## 2018-01-04 ENCOUNTER — Other Ambulatory Visit: Payer: Self-pay

## 2018-01-05 NOTE — Telephone Encounter (Signed)
No more narcotics.  Take Advil, Tylenol, Aleve

## 2018-02-09 ENCOUNTER — Other Ambulatory Visit: Payer: Self-pay

## 2018-02-09 NOTE — Telephone Encounter (Signed)
I have not seen since July 2019.  No narcotics.

## 2018-02-11 ENCOUNTER — Other Ambulatory Visit: Payer: Self-pay

## 2018-05-07 ENCOUNTER — Other Ambulatory Visit: Payer: Self-pay

## 2018-05-10 NOTE — Telephone Encounter (Signed)
I have not seen since July.  I will need to do a telehealth visit if she wants narcotic.

## 2018-05-11 ENCOUNTER — Telehealth: Payer: Self-pay | Admitting: Radiology

## 2018-05-11 NOTE — Telephone Encounter (Signed)
Dr Hilda Lias wants her scheduled for virtual office visit.

## 2018-06-11 NOTE — Telephone Encounter (Signed)
I called to tell patient that Dr Keeling wants to do a virtual visit with her, NA, cannot leave message on VM.   

## 2018-06-11 NOTE — Telephone Encounter (Signed)
I called to tell patient that Dr Hilda Lias wants to do a virtual visit with her, NA, cannot leave message on VM.

## 2018-07-24 ENCOUNTER — Ambulatory Visit (INDEPENDENT_AMBULATORY_CARE_PROVIDER_SITE_OTHER)
Admission: RE | Admit: 2018-07-24 | Discharge: 2018-07-24 | Disposition: A | Discharge status: Home / Self Care | Payer: BLUE CROSS/BLUE SHIELD | Source: Ambulatory Visit

## 2018-07-24 DIAGNOSIS — R109 Unspecified abdominal pain: Secondary | ICD-10-CM

## 2018-07-24 DIAGNOSIS — R05 Cough: Secondary | ICD-10-CM | POA: Diagnosis not present

## 2018-07-24 DIAGNOSIS — Z20828 Contact with and (suspected) exposure to other viral communicable diseases: Secondary | ICD-10-CM

## 2018-07-24 DIAGNOSIS — R5381 Other malaise: Secondary | ICD-10-CM | POA: Diagnosis not present

## 2018-07-24 DIAGNOSIS — M791 Myalgia, unspecified site: Secondary | ICD-10-CM | POA: Diagnosis not present

## 2018-07-24 DIAGNOSIS — Z20822 Contact with and (suspected) exposure to covid-19: Secondary | ICD-10-CM

## 2018-07-24 NOTE — ED Provider Notes (Signed)
Virtual Visit via Video Note:  Catherine Mitchell  initiated request for Telemedicine visit with [facility] team. I connected with Catherine Mitchell  on 07/25/2018 at 8:02 PM  for a synchronized telemedicine visit using a video enabled HIPPA compliant telemedicine application. I verified that I am speaking with Catherine Mitchell  using two identifiers. [name], PA-C  was physically located in a [facility] Urgent care site and [name] was located at a different location.   The limitations of evaluation and management by telemedicine as well as the availability of in-person appointments were discussed. Patient was informed that she  may incur a bill ( including co-pay) for this virtual visit encounter. Catherine Mitchell  expressed understanding and gave verbal consent to proceed with virtual visit.    History of Present Illness:Catherine Mitchell  is a 33 y.o. female presents with illness x1 week.  Patient states that she lost her voice about a week ago, has since developed malaise, myalgias, abdominal pain, diarrhea.  The last 2 days patient states that she has had a productive cough that is non-hemoptic, though interrupts her sleep as well as increased shortness of breath.  Patient states that she feels tired just from walking from her bathroom to her bedroom.  Patient denies fever, lightheadedness, confusion, chills, chest pain, wheezing, nausea, vomiting, hematochezia, melena, liquid stools, urinary symptoms.  Patient denies known sick contacts or close contact with known positive COVID persons.  Patient does work as a Ambulance person in peoples homes.  Patient states that she wears masks, though does have increased exposure risk.  Past Medical History:  Diagnosis Date  . Depression   . HSV (herpes simplex virus) anogenital infection   . Hypoglycemia     Allergies  Allergen Reactions  . Sulfur Other (See Comments)    Throat swells,blisters  . Latex     BREAKS  OUT HANDS  . Tomato     GI UPSET        Observations/Objective: 33 year old female who is well-developed, well-nourished, obese, no acute distress appears sick/tired, but nontoxic.  No tachypnea, wheezing, cough noted throughout appointment.  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  She appears to not be in any acute respiratory distress or dehydrated.  Will refer for COVID testing and have patient practice self isolation.  Discussed need to stay hydrated with water, and use Tylenol for muscle aches and fever.  Follow Up Instructions: Patient to complete COVID testing and self isolate in the interim.  Discussed importance of staying home, particularly if she test positive.  If positive, patient verbalized understanding that she is to stay home except to seek emergency medical care in the ER for worsening shortness of breath or dehydration.   I discussed the assessment and treatment plan with the patient. The patient was provided an opportunity to ask questions and all were answered. The patient agreed with the plan and demonstrated an understanding of the instructions.   The patient was advised to call back or seek an in-person evaluation if the symptoms worsen or if the condition fails to improve as anticipated.  I provided 15 minutes of non-face-to-face time during this encounter.    [place], PA-C  07/25/2018 8:02 PM        Hall-Potvin, [place], Hershal Coria 07/25/18 2002

## 2018-07-25 ENCOUNTER — Telehealth: Payer: Self-pay | Admitting: Internal Medicine

## 2018-07-25 DIAGNOSIS — Z20822 Contact with and (suspected) exposure to covid-19: Secondary | ICD-10-CM

## 2018-07-25 NOTE — Telephone Encounter (Signed)
Appt is 07/26/18 at 0900 at [facility]

## 2018-07-25 NOTE — Telephone Encounter (Signed)
Called and left Vm to make appt for testing. Order placed.

## 2018-07-25 NOTE — Telephone Encounter (Signed)
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

## 2018-07-26 ENCOUNTER — Other Ambulatory Visit: Payer: Self-pay | Admitting: Women's Health

## 2018-07-26 ENCOUNTER — Other Ambulatory Visit: Payer: Self-pay

## 2018-07-26 ENCOUNTER — Other Ambulatory Visit: Payer: BLUE CROSS/BLUE SHIELD

## 2018-07-26 DIAGNOSIS — Z20822 Contact with and (suspected) exposure to covid-19: Secondary | ICD-10-CM

## 2018-07-26 MED ORDER — ALPRAZOLAM 0.25 MG PO TABS: 0.2500 mg | ORAL_TABLET | Freq: Every evening | ORAL | 0 refills | Status: DC | PRN

## 2018-07-26 NOTE — Telephone Encounter (Signed)
Very overdue with last CE 2 years ago on 06/10/2016.  Patient wrote "Patient comment: Please refill my nerves have been horrible and I`m about to start my period and I don`t feel myself. Please help with this and if I need to see you virtually I will."

## 2018-07-26 NOTE — Telephone Encounter (Signed)
Spoke with patient and advised her 2 years since last CE. [place] requests sched appt ASAP.  SHe is sick and had COVID test today. She said she is happy to schedule but will make it 3-4 weeks out to see how this illness plays out. Rx sent. Appt scheduled.

## 2018-07-26 NOTE — Telephone Encounter (Signed)
Okay for #15 Xanax 0.25 as needed have her schedule annual exam ASAP, tell her we cannot do an annual virtually.  But since she has not been here in 2 years I cannot give her more than 15.

## 2018-07-27 ENCOUNTER — Ambulatory Visit
Admission: RE | Admit: 2018-07-27 | Discharge: 2018-07-27 | Disposition: A | Discharge status: Home / Self Care | Payer: BLUE CROSS/BLUE SHIELD | Source: Ambulatory Visit

## 2018-07-27 ENCOUNTER — Ambulatory Visit (INDEPENDENT_AMBULATORY_CARE_PROVIDER_SITE_OTHER)
Admission: RE | Admit: 2018-07-27 | Discharge: 2018-07-27 | Disposition: A | Discharge status: Home / Self Care | Payer: BLUE CROSS/BLUE SHIELD | Source: Ambulatory Visit

## 2018-07-27 DIAGNOSIS — R0981 Nasal congestion: Secondary | ICD-10-CM | POA: Diagnosis not present

## 2018-07-27 DIAGNOSIS — R0602 Shortness of breath: Secondary | ICD-10-CM | POA: Diagnosis not present

## 2018-07-27 DIAGNOSIS — R6889 Other general symptoms and signs: Secondary | ICD-10-CM

## 2018-07-27 DIAGNOSIS — R0789 Other chest pain: Secondary | ICD-10-CM | POA: Diagnosis not present

## 2018-07-27 DIAGNOSIS — Z20822 Contact with and (suspected) exposure to covid-19: Secondary | ICD-10-CM

## 2018-07-27 DIAGNOSIS — R05 Cough: Secondary | ICD-10-CM | POA: Diagnosis not present

## 2018-07-27 MED ORDER — HYDROCODONE-HOMATROPINE 5-1.5 MG/5ML PO SYRP: 5.0000 mL | ORAL_SOLUTION | Freq: Four times a day (QID) | ORAL | 0 refills | Status: DC | PRN

## 2018-07-27 MED ORDER — AZITHROMYCIN 250 MG PO TABS: 250.0000 mg | ORAL_TABLET | Freq: Every day | ORAL | 0 refills | Status: DC

## 2018-07-27 NOTE — Telephone Encounter (Signed)
Set up virtual visit tomorrow after end of clinic in [place].

## 2018-07-27 NOTE — Telephone Encounter (Signed)
Thank you for using MyChart. We have called and have left voice mail message regarding virtual appointment availability for tomorrow, 07/28/18. Please call [phone].

## 2018-07-27 NOTE — ED Provider Notes (Signed)
Virtual Visit via Video Note:  Catherine Mitchell  initiated request for Telemedicine visit with [facility] Urgent Care team. I connected with Catherine Mitchell  on 07/27/2018 at 3:25 PM  for a synchronized telemedicine visit using a video enabled HIPPA compliant telemedicine application. I verified that I am speaking with Catherine Mitchell  using two identifiers. Orvan July, NP  was physically located in a [facility] Urgent care site and GERMANI GAVILANES was located at a different location.   The limitations of evaluation and management by telemedicine as well as the availability of in-person appointments were discussed. Patient was informed that she  may incur a bill ( including co-pay) for this virtual visit encounter. Catherine Mitchell  expressed understanding and gave verbal consent to proceed with virtual visit.     History of Present Illness:Catherine Mitchell  is a 33 y.o. female presents with worsening cough, chest congestion, thick mucous, mild chest pain and SOB. Symptoms have been constant and worse over the last week. She was seen on E visit 3 days ago. Reports that she is up all night coughing. Nothing OTC is helping her symptoms. Still having low grade fever. She had COVID testing and has not yet received results.   Past Medical History:  Diagnosis Date  . Depression   . HSV (herpes simplex virus) anogenital infection   . Hypoglycemia     Allergies  Allergen Reactions  . Sulfur Other (See Comments)    Throat swells,blisters  . Latex     BREAKS OUT HANDS  . Tomato     GI UPSET        Observations/Objective:GENERAL APPEARANCE: Well developed, well nourished, alert and cooperative, and appears to be in no acute distress. HEAD: normocephalic. Non labored breathing, no dyspnea or distress Voice very hoarse and actively coughing.  Skin: Skin normal color  PSYCHIATRIC: The mental examination revealed the patient was oriented to person, place, and time. The patient was able  to demonstrate good judgement and reason, without hallucinations, abnormal affect or abnormal behaviors during the examination. Patient is not suicidal     Assessment and Plan: Patient with 7 days of cough, chest congestion, low-grade fever.  Using over-the-counter medicines without much relief.  She reports that her symptoms have worsened and she is having mild chest discomfort and shortness of breath at times. No dyspnea or distress on video. Based on length of symptoms and worsening symptoms we will go ahead and treat with Z-Pak and hydrocodone cough syrup for cough Patient has already been COVID tested.  Results are still pending.    Follow Up Instructions: Recommended that if symptoms worsen despite treatment she will need to go to the hospital.    I discussed the assessment and treatment plan with the patient. The patient was provided an opportunity to ask questions and all were answered. The patient agreed with the plan and demonstrated an understanding of the instructions.   The patient was advised to call back or seek an in-person evaluation if the symptoms worsen or if the condition fails to improve as anticipated.      Orvan July, NP  07/27/2018 3:25 PM         Orvan July, NP 07/28/18 0840

## 2018-07-27 NOTE — Telephone Encounter (Signed)
I have not seen since last year.  I can do virtual visit or have Dr. Lemmie Evens call this in as he saw her recently.

## 2018-07-27 NOTE — Discharge Instructions (Signed)
Take the medication as prescribed. For any worsening symptoms you need to go to the ER. We will call you with your COVID results once we receive those Make sure you are taking the correct precautions in the meantime

## 2018-07-29 ENCOUNTER — Ambulatory Visit: Payer: Self-pay | Admitting: *Deleted

## 2018-07-29 NOTE — Telephone Encounter (Signed)
Pt called in checking on her COVID-19 results.   They are not ready yet.   I let her know since she is active on Mychart that her results would come into her Mychart and to check there.   Her results should be ready any time.  She verbalized understanding.

## 2018-07-31 ENCOUNTER — Telehealth: Payer: BLUE CROSS/BLUE SHIELD

## 2018-07-31 ENCOUNTER — Other Ambulatory Visit: Payer: Self-pay

## 2018-07-31 ENCOUNTER — Telehealth (HOSPITAL_COMMUNITY): Payer: Self-pay

## 2018-07-31 ENCOUNTER — Emergency Department (HOSPITAL_COMMUNITY): Payer: BLUE CROSS/BLUE SHIELD

## 2018-07-31 ENCOUNTER — Encounter (HOSPITAL_COMMUNITY): Payer: Self-pay | Admitting: Emergency Medicine

## 2018-07-31 ENCOUNTER — Emergency Department (HOSPITAL_COMMUNITY)
Admission: EM | Admit: 2018-07-31 | Discharge: 2018-07-31 | Disposition: A | Discharge status: Home / Self Care | Payer: BLUE CROSS/BLUE SHIELD | Attending: Emergency Medicine | Admitting: Emergency Medicine

## 2018-07-31 DIAGNOSIS — J40 Bronchitis, not specified as acute or chronic: Secondary | ICD-10-CM | POA: Diagnosis not present

## 2018-07-31 DIAGNOSIS — R0602 Shortness of breath: Secondary | ICD-10-CM | POA: Diagnosis present

## 2018-07-31 DIAGNOSIS — Z91040 Latex allergy status: Secondary | ICD-10-CM | POA: Diagnosis not present

## 2018-07-31 DIAGNOSIS — Z79899 Other long term (current) drug therapy: Secondary | ICD-10-CM | POA: Diagnosis not present

## 2018-07-31 DIAGNOSIS — Z20828 Contact with and (suspected) exposure to other viral communicable diseases: Secondary | ICD-10-CM | POA: Insufficient documentation

## 2018-07-31 LAB — CBC WITH DIFFERENTIAL/PLATELET
Abs Immature Granulocytes: 0.09 10*3/uL — ABNORMAL HIGH (ref 0.00–0.07)
Basophils Absolute: 0.1 10*3/uL (ref 0.0–0.1)
Basophils Relative: 1 %
Eosinophils Absolute: 0.3 10*3/uL (ref 0.0–0.5)
Eosinophils Relative: 3 %
HCT: 44 % (ref 36.0–46.0)
Hemoglobin: 14.6 g/dL (ref 12.0–15.0)
Immature Granulocytes: 1 %
Lymphocytes Relative: 14 %
Lymphs Abs: 1.8 10*3/uL (ref 0.7–4.0)
MCH: 29 pg (ref 26.0–34.0)
MCHC: 33.2 g/dL (ref 30.0–36.0)
MCV: 87.5 fL (ref 80.0–100.0)
Monocytes Absolute: 0.8 10*3/uL (ref 0.1–1.0)
Monocytes Relative: 6 %
Neutro Abs: 9.8 10*3/uL — ABNORMAL HIGH (ref 1.7–7.7)
Neutrophils Relative %: 75 %
Platelets: 372 10*3/uL (ref 150–400)
RBC: 5.03 MIL/uL (ref 3.87–5.11)
RDW: 13.3 % (ref 11.5–15.5)
WBC: 12.9 10*3/uL — ABNORMAL HIGH (ref 4.0–10.5)
nRBC: 0 % (ref 0.0–0.2)

## 2018-07-31 LAB — BASIC METABOLIC PANEL
Anion gap: 16 — ABNORMAL HIGH (ref 5–15)
BUN: 22 mg/dL — ABNORMAL HIGH (ref 6–20)
CO2: 22 mmol/L (ref 22–32)
Calcium: 10.6 mg/dL — ABNORMAL HIGH (ref 8.9–10.3)
Chloride: 103 mmol/L (ref 98–111)
Creatinine, Ser: 0.65 mg/dL (ref 0.44–1.00)
GFR calc Af Amer: >60 mL/min (ref >60)
GFR calc non Af Amer: >60 mL/min (ref >60)
Glucose, Bld: 107 mg/dL — ABNORMAL HIGH (ref 70–99)
Potassium: 3.9 mmol/L (ref 3.5–5.1)
Sodium: 141 mmol/L (ref 135–145)

## 2018-07-31 LAB — BRAIN NATRIURETIC PEPTIDE: B Natriuretic Peptide: 11 pg/mL (ref 0.0–100.0)

## 2018-07-31 LAB — SARS CORONAVIRUS 2 BY RT PCR (HOSPITAL ORDER, PERFORMED IN ~~LOC~~ HOSPITAL LAB): SARS Coronavirus 2: NEGATIVE

## 2018-07-31 LAB — NOVEL CORONAVIRUS, NAA: SARS-CoV-2, NAA: NOT DETECTED

## 2018-07-31 MED ORDER — PREDNISONE 20 MG PO TABS: ORAL_TABLET | ORAL | 0 refills | Status: DC

## 2018-07-31 MED ORDER — HYDROCODONE-HOMATROPINE 5-1.5 MG/5ML PO SYRP: 5.0000 mL | ORAL_SOLUTION | Freq: Four times a day (QID) | ORAL | 0 refills | Status: DC | PRN

## 2018-07-31 MED ORDER — IOHEXOL 350 MG/ML SOLN
100.0000 mL | Freq: Once | INTRAVENOUS | Status: AC | PRN
  Administered 2018-07-31: 100 mL via INTRAVENOUS

## 2018-07-31 NOTE — Discharge Instructions (Addendum)
Follow up with your md next week if not improving °

## 2018-07-31 NOTE — ED Notes (Signed)
Patient could not tolerate IV start with [place]. Patient had anxiety attack while attempting IV start.  Patient given cold wash cloth.

## 2018-07-31 NOTE — ED Triage Notes (Signed)
Pt reports she has been having some shortness of breath for the past week. Was tested for Covid on Monday but results are not in. Pt speaking in full sentences.

## 2018-07-31 NOTE — ED Provider Notes (Signed)
[facility] EMERGENCY DEPARTMENT Provider Note   CSN: [id number] Arrival date & time: 07/31/18  1216    History   Chief Complaint Chief Complaint  Patient presents with  . Shortness of Breath    HPI Catherine Mitchell is a 33 y.o. female.     HPI  The patient is a 33 year old female, she is very obese but has no prior history of lung disease or heart disease and presents with shortness of breath with a cough.  This is been going on for approximately 7 days although at the time of her visit on June 20 she had also stated that she had 1 week of symptoms prior to that.  She reports that it started with coughing productive of phlegm, some shortness of breath, was tested for coronavirus which has not yet resulted.  She was then seen at an urgent care visit, where she was given Zithromax and hydrocodone cough syrup on the 23rd.  I have reviewed the medical record and the notes of these providers.  COVID test was actually done on June 22.  The patient complains of a myriad of symptoms including chest pain, shortness of breath, cough, headache and diarrhea.  She had reported in prior visits that she had no known sick exposures.  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  She also reports that she was using some of her mother's diuretic medication to help with swelling since she felt like she was retaining water and lost 20 pounds over the last week due to diuretic use and frequent urination.  Paramedics were called to the house this morning because of increasing chest pain and shortness of breath, they found the patient to have normal vital signs including normal oxygen level, no tachycardia, no other abnormal vital signs.  Past Medical History:  Diagnosis Date  . Depression   . HSV (herpes simplex virus) anogenital infection   . Hypoglycemia     Patient Active Problem List   Diagnosis Date Noted  . Yeast  vaginitis 12/17/2016  . H N P-CERVICAL 09/18/2008  . Sprain of neck 08/30/2008    Past Surgical History:  Procedure Laterality Date  . INTRAUTERINE DEVICE INSERTION  04/20/2013   ParaGard  . nexplanon removal  2015  . URETHRA SURGERY  had it stretched as a child     OB History    Gravida  0   Para  0   Term  0   Preterm  0   AB  0   Living  0     SAB  0   TAB  0   Ectopic  0   Multiple  0   Live Births  0            Home Medications    Prior to Admission medications   Medication Sig Start Date End Date Taking? Authorizing Provider  ALPRAZolam (XANAX) 0.25 MG tablet Take 1 tablet (0.25 mg total) by mouth at bedtime as needed for anxiety. Patient taking differently: Take 0.5 mg by mouth at bedtime as needed for anxiety.  07/26/18  Yes Huel Cote, NP  azithromycin (ZITHROMAX) 250 MG tablet Take 1 tablet (250 mg total) by mouth daily. Take first 2 tablets together, then 1 every day until finished. 07/27/18  Yes Bast, Traci A, NP  HYDROcodone-homatropine (HYCODAN) 5-1.5 MG/5ML syrup Take 5 mLs by mouth every 6 (six) hours as needed for cough. 07/27/18  Yes Bast, Traci A, NP  Multiple Vitamin (MULTIVITAMIN) capsule Take 1 capsule by mouth daily.   Yes [provider]  ondansetron (ZOFRAN) 4 MG tablet Take 1 tablet by mouth every 8 (eight) hours as needed. 03/13/18  Yes [provider]  PROAIR HFA 108 (90 Base) MCG/ACT inhaler INHALE 1 PUFF INTO THE LUNGS EVERY 4-6 HOURS AS NEEDED FOR WHEEZING 01/27/17  Yes [provider]  venlafaxine XR (EFFEXOR-XR) 75 MG 24 hr capsule Take 225 mg by mouth daily with breakfast.   Yes [provider]  methylphenidate 36 MG PO CR tablet Take 36 mg by mouth daily.  07/27/18  [provider]  norethindrone-ethinyl estradiol (JUNEL FE,GILDESS FE,LOESTRIN FE) 1-20 MG-MCG tablet Take 1 tablet by mouth daily. 08/18/16 07/27/18  Huel Cote, NP  topiramate (TOPAMAX) 50 MG tablet Take 50 mg by mouth  daily.  07/27/18  [provider]    Family History Family History  Problem Relation Age of Onset  . Diabetes Mother   . Thyroid disease Mother   . Diabetes Maternal Grandmother   . Hypertension Father     Social History Social History   Tobacco Use  . Smoking status: Never Smoker  . Smokeless tobacco: Never Used  Substance Use Topics  . Alcohol use: Yes    Alcohol/week: 0.0 standard drinks    Comment: occasional  . Drug use: No     Allergies   Sulfur, Latex, and Tomato   Review of Systems Review of Systems  All other systems reviewed and are negative.    Physical Exam Updated Vital Signs BP 124/88 (BP Location: Left Arm)   Pulse 81   Temp 98.4 F (36.9 C) (Oral)   Resp 18   Ht 1.626 m ('5\' 4"' )   Wt 122.9 kg   LMP 07/11/2018 Comment: IUD preg test not ordered  SpO2 100%   BMI 46.52 kg/m   Physical Exam Vitals signs and nursing note reviewed.  Constitutional:      General: She is not in acute distress.    Appearance: She is well-developed.  HENT:     Head: Normocephalic and atraumatic.     Mouth/Throat:     Pharynx: No oropharyngeal exudate.  Eyes:     General: No scleral icterus.       Right eye: No discharge.        Left eye: No discharge.     Conjunctiva/sclera: Conjunctivae normal.     Pupils: Pupils are equal, round, and reactive to light.  Neck:     Musculoskeletal: Normal range of motion and neck supple.     Thyroid: No thyromegaly.     Vascular: No JVD.  Cardiovascular:     Rate and Rhythm: Normal rate and regular rhythm.     Heart sounds: Normal heart sounds. No murmur. No friction rub. No gallop.   Pulmonary:     Effort: Pulmonary effort is normal. No respiratory distress.     Breath sounds: Normal breath sounds. No wheezing or rales.  Abdominal:     General: Bowel sounds are normal. There is no distension.     Palpations: Abdomen is soft. There is no mass.     Tenderness: There is no abdominal tenderness.   Musculoskeletal: Normal range of motion.        General: No tenderness.  Lymphadenopathy:     Cervical: No cervical adenopathy.  Skin:    General: Skin is warm and dry.     Findings: No erythema  or rash.  Neurological:     Mental Status: She is alert.     Coordination: Coordination normal.  Psychiatric:        Behavior: Behavior normal.      ED Treatments / Results  Labs (all labs ordered are listed, but only abnormal results are displayed) Labs Reviewed  CBC WITH DIFFERENTIAL/PLATELET - Abnormal; Notable for the following components:      Result Value   WBC 12.9 (*)    Neutro Abs 9.8 (*)    Abs Immature Granulocytes 0.09 (*)    All other components within normal limits  BASIC METABOLIC PANEL - Abnormal; Notable for the following components:   Glucose, Bld 107 (*)    BUN 22 (*)    Calcium 10.6 (*)    Anion gap 16 (*)    All other components within normal limits  SARS CORONAVIRUS 2 (HOSPITAL ORDER, [place] [facility])  BRAIN NATRIURETIC PEPTIDE    EKG EKG Interpretation  Date/Time:  Saturday July 31 2018 12:26:25 EDT Ventricular Rate:  79 PR Interval:    QRS Duration: 95 QT Interval:  381 QTC Calculation: 437 R Axis:   89 Text Interpretation:  Sinus rhythm Normal ECG No old tracing to compare Confirmed by Noemi Chapel [phone]) on 07/31/2018 12:30:29 PM   Radiology Dg Chest Port 1 View  Result Date: 07/31/2018 CLINICAL DATA:  Cough and shortness of breath EXAM: PORTABLE CHEST 1 VIEW COMPARISON:  May 01, 2011 FINDINGS: Lungs are clear. Heart size and pulmonary vascularity are normal. No adenopathy. No bone lesions. IMPRESSION: No edema or consolidation. Electronically Signed   By: Lowella Grip III M.D.   On: 07/31/2018 13:33    Procedures  Angiocath insertion Performed by: Johnna Acosta  Consent: Verbal consent obtained. Risks and benefits: risks, benefits and alternatives were discussed Time out: Immediately prior to procedure a  "time out" was called to verify the correct patient, procedure, equipment, support staff and site/side marked as required.  Preparation: Patient was prepped and draped in the usual sterile fashion.  Vein Location: R AC  Ultrasound Guided  Gauge: 20  Normal blood return and flush without difficulty Patient tolerance: Patient tolerated the procedure well with no immediate complications.     Procedures (including critical care time)  Medications Ordered in ED Medications - No data to display   Initial Impression / Assessment and Plan / ED Course  I have reviewed the triage vital signs and the nursing notes.  Pertinent labs & imaging results that were available during my care of the patient were reviewed by me and considered in my medical decision making (see chart for details).  Clinical Course as of Jul 30 1505  Sat Jul 31, 2018  1339 X-ray reviewed, images interpreted by myself and radiology, no signs of acute infiltrate, edema, consolidation or abnormal mediastinum.  There is a leukocytosis   [BM]    Clinical Course User Index [BM] Noemi Chapel, MD       The patient's exam is very unremarkable.  She has no objective edema of her legs, she is very obese but has no significant pitting edema, her lungs are clear, heart is regular without tachycardia and she does not appear to be in distress.  She is clearly anxious regarding her symptoms which is reasonable given her prolonged 2-week course.  She is currently on day 3 of Zithromax, states that the first day she felt better but now her symptoms have returned.  It would  not make sense that the Zithromax helped her so quickly and more likely that she has some other underlying process whether it is viral or cardiopulmonary in nature.  We will start with rapid COVID test since the other test is now 5 days without results and this would help target whether she needs a CT angiogram or not.  The patient is agreeable to the plan.   Thankfully coronavirus is negative, slight leukocytosis, metabolic panel is unremarkable.  I placed an ultrasound-guided IV in the right antecubital fossa.  Please see procedure note below.  Nursing was unable to obtain IV access thus requesting my assistance.  The patient will go for CT angiogram.  Change of shift, care signed out to Dr. [name] will follow-up CT angiogram and disposition accordingly.  Final Clinical Impressions(s) / ED Diagnoses   Final diagnoses:  None    ED Discharge Orders    None       Noemi Chapel, MD 07/31/18 1507

## 2018-07-31 NOTE — ED Notes (Signed)
Dr Sabra Heck told me to get the stuff together for him to try and start IV with the ultrasound. When I started to take the ultrasound machine in the room she stated that she could not do that again.

## 2018-08-16 ENCOUNTER — Other Ambulatory Visit: Payer: Self-pay

## 2018-08-17 ENCOUNTER — Encounter: Payer: Self-pay | Admitting: Women's Health

## 2018-08-17 DIAGNOSIS — Z0289 Encounter for other administrative examinations: Secondary | ICD-10-CM

## 2018-09-10 ENCOUNTER — Ambulatory Visit (INDEPENDENT_AMBULATORY_CARE_PROVIDER_SITE_OTHER)
Admission: RE | Admit: 2018-09-10 | Discharge: 2018-09-10 | Disposition: A | Discharge status: Home / Self Care | Payer: BLUE CROSS/BLUE SHIELD | Source: Ambulatory Visit

## 2018-09-10 ENCOUNTER — Telehealth: Payer: BLUE CROSS/BLUE SHIELD

## 2018-09-10 DIAGNOSIS — B9789 Other viral agents as the cause of diseases classified elsewhere: Secondary | ICD-10-CM | POA: Diagnosis not present

## 2018-09-10 DIAGNOSIS — R05 Cough: Secondary | ICD-10-CM | POA: Diagnosis not present

## 2018-09-10 DIAGNOSIS — J069 Acute upper respiratory infection, unspecified: Secondary | ICD-10-CM

## 2018-09-10 DIAGNOSIS — Z20828 Contact with and (suspected) exposure to other viral communicable diseases: Secondary | ICD-10-CM

## 2018-09-10 DIAGNOSIS — Z20822 Contact with and (suspected) exposure to covid-19: Secondary | ICD-10-CM

## 2018-09-10 DIAGNOSIS — Z765 Malingerer [conscious simulation]: Secondary | ICD-10-CM

## 2018-09-10 DIAGNOSIS — J209 Acute bronchitis, unspecified: Secondary | ICD-10-CM

## 2018-09-10 DIAGNOSIS — R5383 Other fatigue: Secondary | ICD-10-CM

## 2018-09-10 DIAGNOSIS — J029 Acute pharyngitis, unspecified: Secondary | ICD-10-CM

## 2018-09-10 MED ORDER — PREDNISONE 20 MG PO TABS: 20.0000 mg | ORAL_TABLET | Freq: Two times a day (BID) | ORAL | 0 refills | Status: AC

## 2018-09-10 MED ORDER — BENZONATATE 100 MG PO CAPS: 100.0000 mg | ORAL_CAPSULE | Freq: Three times a day (TID) | ORAL | 0 refills | Status: DC

## 2018-09-10 MED ORDER — DEXTROMETHORPHAN HBR 15 MG/5ML PO SYRP: 5.0000 mL | ORAL_SOLUTION | Freq: Every evening | ORAL | 0 refills | Status: AC | PRN

## 2018-09-10 NOTE — Discharge Instructions (Addendum)
COVID testing ordered. Go to [facility] in [place] to be tested.  It will take between 5-7 days for your results to return.    In the meantime: You should remain isolated in your home for 10 days from symptom onset AND greater than 72 hours after symptoms resolution (absence of fever without the use of fever-reducing medication and improvement in respiratory symptoms), whichever is longer Get plenty of rest and push fluids Prednisone prescribed for possible bronchitis Tessalon Perles prescribed for cough Cough syrup for severe symptoms at night.  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

## 2018-09-10 NOTE — ED Provider Notes (Signed)
[facility]    Virtual Visit via Video Note:  Catherine SjogrenBetsy L Parlow  initiated request for Telemedicine visit with [facility]. I connected with Catherine SjogrenBetsy L Kipnis  on 09/10/2018 at 1:45 PM  for a synchronized telemedicine visit using a video enabled HIPPA compliant telemedicine application. I verified that I am speaking with Catherine SjogrenBetsy L Kennard  using two identifiers. Rennis HardingBrittany Habeeb Puertas, PA-C  was physically located in a [facility] and Catherine SjogrenBetsy L Finnie was located at a different location.   The limitations of evaluation and management by telemedicine as well as the availability of in-person appointments were discussed. Patient was informed that she  may incur a bill ( including co-pay) for this virtual visit encounter. Catherine SjogrenBetsy L Allemand  expressed understanding and gave verbal consent to proceed with virtual visit.  [id number] 09/10/18 Arrival Time: 1222  Cc: cough   SUBJECTIVE:  Catherine Mitchell is a 33 y.o. female who presents with congestion and intermittent productive cough with green/ yellow phlegm x 2 days.  Denies sick exposure to COVID, flu or strep.  Denies recent travel.  Has tried OTC medications without relief.  Symptoms are made worse with the heat and at night.  Reports previous symptoms in the past that resolved with z-pak, prednisone, and cough syrup.   Complains of associated chills, fatigue, sore throat, sinus congestion/ pressure, wheezing, and feeling more "out of breath."  Denies fever, rhinorrhea, SOB, chest pain, chest pressure, nausea, vomiting, changes in bowel or bladder habits.    Denies hx of COPD/ asthma  ROS: As per HPI.  All other pertinent ROS negative.     Past Medical History:  Diagnosis Date  . Depression   . HSV (herpes simplex virus) anogenital infection   . Hypoglycemia    Past Surgical History:  Procedure Laterality Date  . INTRAUTERINE DEVICE INSERTION  04/20/2013   ParaGard  . nexplanon removal  2015  .  URETHRA SURGERY  had it stretched as a child   Allergies  Allergen Reactions  . Sulfur Other (See Comments)    Throat swells,blisters  . Latex     BREAKS OUT HANDS  . Tomato     GI UPSET   No current facility-administered medications on file prior to encounter.    Current Outpatient Medications on File Prior to Encounter  Medication Sig Dispense Refill  . gabapentin (NEURONTIN) 600 MG tablet Take 600 mg by mouth 3 (three) times daily.    . methylphenidate 54 MG PO CR tablet Take 54 mg by mouth every morning.    Marland Kitchen. ALPRAZolam (XANAX) 0.25 MG tablet Take 1 tablet (0.25 mg total) by mouth at bedtime as needed for anxiety. (Patient taking differently: Take 0.5 mg by mouth at bedtime as needed for anxiety. ) 15 tablet 0  . Multiple Vitamin (MULTIVITAMIN) capsule Take 1 capsule by mouth daily.    Marland Kitchen. PROAIR HFA 108 (90 Base) MCG/ACT inhaler INHALE 1 PUFF INTO THE LUNGS EVERY 4-6 HOURS AS NEEDED FOR WHEEZING  0  . venlafaxine XR (EFFEXOR-XR) 75 MG 24 hr capsule Take 225 mg by mouth daily with breakfast.    . [DISCONTINUED] norethindrone-ethinyl estradiol (JUNEL FE,GILDESS FE,LOESTRIN FE) 1-20 MG-MCG tablet Take 1 tablet by mouth daily. 3 Package 1  . [DISCONTINUED] topiramate (TOPAMAX) 50 MG tablet Take 50 mg by mouth daily.       OBJECTIVE:  There were no vitals filed for this visit.   General appearance: alert; no distress Eyes: EOMI  grossly HENT: normocephalic; atraumatic Neck: supple with FROM Lungs: normal respiratory effort; speaking in full sentences without difficulty Extremities: moves extremities without difficulty Skin: No obvious rashes Neurologic: No facial asymmetries Psychological: alert and cooperative; normal mood and affect   ASSESSMENT & PLAN:  1. Viral URI with cough   2. Suspected Covid-19 Virus Infection   3. Acute bronchitis, unspecified organism     Meds ordered this encounter  Medications  . predniSONE (DELTASONE) 20 MG tablet    Sig: Take 1 tablet (20  mg total) by mouth 2 (two) times daily with a meal for 5 days.    Dispense:  10 tablet    Refill:  0    Order Specific Question:   Supervising Provider    Answer:   Raylene Everts [[id number]]  . benzonatate (TESSALON) 100 MG capsule    Sig: Take 1 capsule (100 mg total) by mouth every 8 (eight) hours.    Dispense:  21 capsule    Refill:  0    Order Specific Question:   Supervising Provider    Answer:   Raylene Everts [[id number]]  . dextromethorphan 15 MG/5ML syrup    Sig: Take 5 mLs (15 mg total) by mouth at bedtime as needed for up to 5 days for cough.    Dispense:  25 mL    Refill:  0    Order Specific Question:   Supervising Provider    Answer:   Raylene Everts [[id number]]    Orders Placed This Encounter  Procedures  . Novel Coronavirus, NAA ([facility])    Order Specific Question:   Is this test for diagnosis or screening    Answer:   Diagnosis of ill patient    Order Specific Question:   Symptomatic for COVID-19 as defined by CDC    Answer:   Yes    Order Specific Question:   Date of Symptom Onset    Answer:   09/08/2018    Order Specific Question:   Hospitalized for COVID-19    Answer:   No    Order Specific Question:   Admitted to ICU for COVID-19    Answer:   No    Order Specific Question:   Previously tested for COVID-19    Answer:   Yes    Order Specific Question:   Resident in a congregate (group) care setting    Answer:   No    Order Specific Question:   Employed in healthcare setting    Answer:   No    Order Specific Question:   Pregnant    Answer:   Unknown    COVID testing ordered. Go to [facility] in [place] to be tested.  It will take between 5-7 days for your results to return.    In the meantime: You should remain isolated in your home for 10 days from symptom onset AND greater than 72 hours after symptoms resolution (absence of fever without the use of fever-reducing medication and improvement in respiratory symptoms), whichever is longer  Get plenty of rest and push fluids Prednisone prescribed for possible bronchitis Tessalon Perles prescribed for cough Cough syrup for severe symptoms at night.  DO NOT TAKE PRIOR TO DRIVING OR OPERATING HEAVY MACHINERY Use OTC Zyrtec-D for nasal congestion, runny nose, and/or sore throat Use OTC Flonase for nasal congestion and runny nose Use medications daily for symptom relief Use OTC medications like ibuprofen or tylenol as needed fever or pain Call or go  to the ED if you have any new or worsening symptoms such as fever, worsening cough, shortness of breath, chest tightness, chest pain, turning blue, changes in mental status, etc...   Instructed patient to follow up in person if not improving over the next 1-3 days.     I discussed the assessment and treatment plan with the patient. The patient was provided an opportunity to ask questions and all were answered. The patient agreed with the plan and demonstrated an understanding of the instructions.   The patient was advised to call back or seek an in-person evaluation if the symptoms worsen or if the condition fails to improve as anticipated.  I provided 15 minutes of non-face-to-face time during this encounter.  Seventh MountainBrittany Miyo Aina, PA-C  09/10/2018 1:45 PM           Rennis HardingWurst, Adriena Manfre, PA-C 09/10/18 1347

## 2018-10-25 ENCOUNTER — Encounter: Payer: Self-pay | Admitting: Gynecology

## 2018-11-16 ENCOUNTER — Encounter: Payer: Self-pay | Admitting: Emergency Medicine

## 2018-11-16 ENCOUNTER — Ambulatory Visit
Admission: EM | Admit: 2018-11-16 | Discharge: 2018-11-16 | Disposition: A | Discharge status: Home / Self Care | Payer: BLUE CROSS/BLUE SHIELD | Attending: Family Medicine | Admitting: Family Medicine

## 2018-11-16 ENCOUNTER — Other Ambulatory Visit: Payer: Self-pay

## 2018-11-16 ENCOUNTER — Ambulatory Visit
Admit: 2018-11-16 | Discharge: 2018-11-16 | Disposition: A | Discharge status: Home / Self Care | Payer: BLUE CROSS/BLUE SHIELD | Attending: Urgent Care | Admitting: Urgent Care

## 2018-11-16 ENCOUNTER — Ambulatory Visit
Admission: RE | Admit: 2018-11-16 | Discharge: 2018-11-16 | Disposition: A | Discharge status: Home / Self Care | Payer: BLUE CROSS/BLUE SHIELD | Source: Ambulatory Visit | Attending: Emergency Medicine | Admitting: Emergency Medicine

## 2018-11-16 DIAGNOSIS — Z3202 Encounter for pregnancy test, result negative: Secondary | ICD-10-CM

## 2018-11-16 DIAGNOSIS — N200 Calculus of kidney: Secondary | ICD-10-CM

## 2018-11-16 DIAGNOSIS — R109 Unspecified abdominal pain: Secondary | ICD-10-CM | POA: Diagnosis not present

## 2018-11-16 DIAGNOSIS — Z87442 Personal history of urinary calculi: Secondary | ICD-10-CM | POA: Diagnosis not present

## 2018-11-16 HISTORY — DX: Calculus of kidney: N20.0

## 2018-11-16 LAB — URINALYSIS, COMPLETE (UACMP) WITH MICROSCOPIC
Bilirubin Urine: NEGATIVE
Glucose, UA: NEGATIVE mg/dL
Hgb urine dipstick: NEGATIVE
Ketones, ur: NEGATIVE mg/dL
Leukocytes,Ua: NEGATIVE
Nitrite: NEGATIVE
Protein, ur: NEGATIVE mg/dL
Specific Gravity, Urine: 1.015 (ref 1.005–1.030)
WBC, UA: NONE SEEN WBC/hpf (ref 0–5)
pH: 8.5 — ABNORMAL HIGH (ref 5.0–8.0)

## 2018-11-16 LAB — PREGNANCY, URINE: Preg Test, Ur: NEGATIVE

## 2018-11-16 MED ORDER — OXYCODONE-ACETAMINOPHEN 5-325 MG PO TABS: 1.0000 | ORAL_TABLET | Freq: Two times a day (BID) | ORAL | 0 refills | Status: DC | PRN

## 2018-11-16 MED ORDER — KETOROLAC TROMETHAMINE 60 MG/2ML IM SOLN
60.0000 mg | Freq: Once | INTRAMUSCULAR | Status: AC
  Administered 2018-11-16: 60 mg via INTRAMUSCULAR

## 2018-11-16 NOTE — ED Triage Notes (Signed)
Patient c/o possible kidney stone that started last night. She is c/o right side back pain and dysuria.

## 2018-11-16 NOTE — Discharge Instructions (Addendum)
It was very nice seeing you today in clinic. Thank you for entrusting me with your care.   Please utilize the medications that we discussed. Your prescriptions has been called in to your pharmacy.   Make arrangements to follow up with your regular doctor in 1 week for re-evaluation if not improving. If your symptoms/condition worsens, please seek follow up care either here or in the ER. Please remember, our [facility] providers are "right here with you" when you need us.   Again, it was my pleasure to take care of you today. Thank you for choosing our clinic. I hope that you start to feel better quickly.   Orel Cooler, MSN, APRN, FNP-C, CEN Advanced Practice Provider [facility] 

## 2018-11-16 NOTE — ED Provider Notes (Signed)
[facility] VIDEO VISIT Provider Note   CSN: [id number] Arrival date & time: 11/16/18  1133     History   Chief Complaint No chief complaint on file.   HPI Catherine Mitchell is a 33 y.o. female.     32yo female with complaint of right side back pain onset yesterday, history of kidney stones, reported to be the 7-8th stone, with nausea and vomiting, chills, no fever- temp 97. Drinking lots of water, usually takes medicine but does not have any pain medicine left over. Taking medicine for nausea (zofran), gabapentin and motrin without relief and requesting something stronger for her pain today. Difficulty urinating. Usual weight 270lbs, today is 292lbs suspected due to retained urine. No history of lithotripsy or stents in the past. Does see urology, not recently.    This encounter was completed through [facility] utilizing the virtual health platform at the request of, and with the consent of the patient/guardian. The encounter took place during the Covid-19 national pandemic when limiting virus exposure to the community at acute care setting is considered an important step in reducing transmission of the disease. The technology provided at the hospital for this encounter are in compliance with HIPPA laws and include a desktop with dual screen monitor, camera and audio head-set.   Patient/guardian understands the limitations in scope of a virtual visit: Yes  Patient/guardian provides consent to proceed with a virtual visit: Yes      Total time used for this encounter: 15 minutes      Past Medical History:  Diagnosis Date  . Depression   . HSV (herpes simplex virus) anogenital infection   . Hypoglycemia   . Kidney stone     Patient Active Problem List   Diagnosis Date Noted  . Kidney stone 11/16/2018  . Yeast vaginitis 12/17/2016  . H N P-CERVICAL 09/18/2008  . Sprain of neck 08/30/2008    Past Surgical History:  Procedure Laterality Date  .  INTRAUTERINE DEVICE INSERTION  04/20/2013   ParaGard  . nexplanon removal  2015  . URETHRA SURGERY  had it stretched as a child     OB History    Gravida  0   Para  0   Term  0   Preterm  0   AB  0   Living  0     SAB  0   TAB  0   Ectopic  0   Multiple  0   Live Births  0            Home Medications    Prior to Admission medications   Medication Sig Start Date End Date Taking? Authorizing Provider  ALPRAZolam (XANAX) 0.25 MG tablet Take 1 tablet (0.25 mg total) by mouth at bedtime as needed for anxiety. Patient taking differently: Take 0.5 mg by mouth at bedtime as needed for anxiety.  07/26/18   Harrington Challenger, NP  benzonatate (TESSALON) 100 MG capsule Take 1 capsule (100 mg total) by mouth every 8 (eight) hours. 09/10/18   Wurst, [place], PA-C  gabapentin (NEURONTIN) 600 MG tablet Take 600 mg by mouth 3 (three) times daily.    [provider]  methylphenidate 54 MG PO CR tablet Take 54 mg by mouth every morning.    [provider]  Multiple Vitamin (MULTIVITAMIN) capsule Take 1 capsule by mouth daily.    [provider]  PROAIR HFA 108 (90 Base) MCG/ACT inhaler INHALE 1 PUFF INTO THE LUNGS  EVERY 4-6 HOURS AS NEEDED FOR WHEEZING 01/27/17   [provider]  venlafaxine XR (EFFEXOR-XR) 75 MG 24 hr capsule Take 225 mg by mouth daily with breakfast.    [provider]  norethindrone-ethinyl estradiol (JUNEL FE,GILDESS FE,LOESTRIN FE) 1-20 MG-MCG tablet Take 1 tablet by mouth daily. 08/18/16 07/27/18  Huel Cote, NP  topiramate (TOPAMAX) 50 MG tablet Take 50 mg by mouth daily.  07/27/18  [provider]    Family History Family History  Problem Relation Age of Onset  . Diabetes Mother   . Thyroid disease Mother   . Diabetes Maternal Grandmother   . Hypertension Father     Social History Social History   Tobacco Use  . Smoking status: Never Smoker  . Smokeless tobacco: Never Used  Substance Use Topics   . Alcohol use: Yes    Alcohol/week: 0.0 standard drinks    Comment: occasional  . Drug use: No     Allergies   Sulfur, Latex, and Tomato   Review of Systems Review of Systems  Constitutional: Positive for chills and unexpected weight change. Negative for fever.  Gastrointestinal: Positive for nausea. Negative for vomiting.  Genitourinary: Positive for decreased urine volume, difficulty urinating and flank pain.  Musculoskeletal: Positive for back pain.  Allergic/Immunologic: Negative for immunocompromised state.  All other systems reviewed and are negative.    Physical Exam Updated Vital Signs There were no vitals taken for this visit.  Vitals:  Provided by patient/family: temp 97  PE: Audio only, no camera access on the patient's phone General appearance:  speech is clear and goal oriented Lungs:  no increased work of breathing, speaks in complete sentences Neuro: Alert Psych: normal mood  Medical decision making: Patient assessed via emergency department virtual visit for right flank pain. Previous lab results and imaging was reviewed for the encounter: Yes - no recent imaging relevant to possible kidneys stones available. Differential diagnosis includes muscle strain, kidney stone, pyelonephritis, UTI Assessment/Clinical impression: flank pain, possible kidney stone Plan: recommend in person assessment.  Medications prescribed or recommended: none, unable to prescribe narcotic pain medications through a virtual visit. If home medications are not controlling pain and concern for 22lb weight gain, patient should be seen and examined. Disposition: follow up with patient's urologist, UC or ER     Final Clinical Impressions(s) / ED Diagnoses   Final diagnoses:  Flank pain    ED Discharge Orders    None       Roque Lias 11/16/18 1251    Lacretia Leigh, MD 11/18/18 1129

## 2018-11-16 NOTE — ED Provider Notes (Signed)
[place], [place]   Name: Catherine Mitchell DOB: 12/24/1985 MRN: [id number] CSN: [id number] PCP: Benita Stabile, MD  Arrival date and time:  11/16/18 1307  Chief Complaint:  Flank Pain   NOTE: Prior to seeing the patient today, I have reviewed the triage nursing documentation and vital signs. Clinical staff has updated patient's PMH/PSHx, current medication list, and drug allergies/intolerances to ensure comprehensive history available to assist in medical decision making.   History:   HPI: Catherine Mitchell is a 33 y.o. female who presents today with complaints of RIGHT lower back and flank pain that began last night with acute onset last night around 1900. Patient has had nausea and vomiting, but no diarrhea. Patient reports that she has not been voiding well. She denies dysuria, but has urinary frequency and urgency. Patient reporting that she has gained a significant amount of weight (22 pounds), however she is unable to report the timeframe in which she has gained this amount of weight. Patient attributing weight gain to fluid retention stemming from her being unable to void. PMH (+) for recurrent urolithiasis. She reports that she has had 7-8 stones in the past. Patient participated in a tele-health visit prior to arrival, however notes that they were unable to give her pain medications. Patient presents in obvious discomfort.   Past Medical History:  Diagnosis Date  . Depression   . HSV (herpes simplex virus) anogenital infection   . Hypoglycemia   . Kidney stone     Past Surgical History:  Procedure Laterality Date  . INTRAUTERINE DEVICE INSERTION  04/20/2013   ParaGard  . nexplanon removal  2015  . URETHRA SURGERY  had it stretched as a child    Family History  Problem Relation Age of Onset  . Diabetes Mother   . Thyroid disease Mother   . Diabetes Maternal Grandmother   . Hypertension Father     Social History   Tobacco Use  . Smoking status: Never Smoker  .  Smokeless tobacco: Never Used  Substance Use Topics  . Alcohol use: Yes    Alcohol/week: 0.0 standard drinks    Comment: occasional  . Drug use: No    Patient Active Problem List   Diagnosis Date Noted  . Kidney stone 11/16/2018  . Yeast vaginitis 12/17/2016  . H N P-CERVICAL 09/18/2008  . Sprain of neck 08/30/2008    Home Medications:    Current Meds  Medication Sig  . ALPRAZolam (XANAX) 0.25 MG tablet Take 1 tablet (0.25 mg total) by mouth at bedtime as needed for anxiety. (Patient taking differently: Take 0.5 mg by mouth at bedtime as needed for anxiety. )  . gabapentin (NEURONTIN) 600 MG tablet Take 600 mg by mouth 3 (three) times daily.  . methylphenidate 54 MG PO CR tablet Take 54 mg by mouth every morning.  . Multiple Vitamin (MULTIVITAMIN) capsule Take 1 capsule by mouth daily.  Marland Kitchen PROAIR HFA 108 (90 Base) MCG/ACT inhaler INHALE 1 PUFF INTO THE LUNGS EVERY 4-6 HOURS AS NEEDED FOR WHEEZING  . venlafaxine XR (EFFEXOR-XR) 75 MG 24 hr capsule Take 225 mg by mouth daily with breakfast.    Allergies:   Sulfur, Latex, and Tomato  Review of Systems (ROS): Review of Systems  Constitutional: Positive for chills and unexpected weight change (22 pound increase; baseline 270). Negative for fever.  Respiratory: Negative for cough and shortness of breath.   Cardiovascular: Negative for chest pain and palpitations.  Gastrointestinal: Positive for nausea and vomiting. Negative  for abdominal pain.  Genitourinary: Positive for decreased urine volume, difficulty urinating, flank pain, frequency and urgency. Negative for dysuria, hematuria, pelvic pain, vaginal bleeding, vaginal discharge and vaginal pain.       PMH (+) for recurrent urolithiasis  Musculoskeletal: Positive for back pain.  Skin: Negative for color change, pallor and rash.  Neurological: Negative for dizziness, syncope, weakness and headaches.  Psychiatric/Behavioral: The patient is nervous/anxious.   All other systems  reviewed and are negative.    Vital Signs: Today's Vitals   11/16/18 1341 11/16/18 1343 11/16/18 1709  BP:  104/85   Pulse:  68   Resp:  18   Temp:  98.4 F (36.9 C)   TempSrc:  Oral   SpO2:  99%   Weight: 270 lb (122.5 kg)    Height: 5\' 8"  (1.727 m)    PainSc: 10-Worst pain ever  8     Physical Exam: Physical Exam  Constitutional: She is oriented to person, place, and time. She appears distressed (2/2 acute pain).  HENT:  Head: Normocephalic and atraumatic.  Mouth/Throat: Mucous membranes are normal.  Eyes: Pupils are equal, round, and reactive to light. EOM are normal.  Neck: Normal range of motion. Neck supple.  Cardiovascular: Normal rate, regular rhythm, normal heart sounds and intact distal pulses. Exam reveals no gallop and no friction rub.  No murmur heard. Pulmonary/Chest: Effort normal and breath sounds normal. No respiratory distress. She has no wheezes. She has no rales.  Abdominal: Soft. Normal appearance. She exhibits no distension. There is abdominal tenderness (lower abdomen and BILATERAL flanks) in the suprapubic area. There is CVA tenderness (R>L).  Neurological: She is alert and oriented to person, place, and time. Gait normal.  Skin: Skin is warm and dry. No rash noted.  Psychiatric: Memory, affect and judgment normal. Her mood appears anxious.  Nursing note and vitals reviewed.   Urgent Care Treatments / Results:   LABS: PLEASE NOTE: all labs that were ordered this encounter are listed, however only abnormal results are displayed. Labs Reviewed  URINALYSIS, COMPLETE (UACMP) WITH MICROSCOPIC - Abnormal; Notable for the following components:      Result Value   APPearance CLOUDY (*)    pH 8.5 (*)    Bacteria, UA FEW (*)    All other components within normal limits  PREGNANCY, URINE    EKG: -None  RADIOLOGY: [facility]  Result Date: 11/16/2018 CLINICAL DATA:  Bilateral flank pain EXAM: RENAL ULTRASOUND COMPARISON:  Abdominal ultrasound February 18, 2008 FINDINGS: Right Kidney: Renal measurements: 10.1 x 4.8 x 5.1 cm = volume: 129.0 mL . Echogenicity and renal cortical thickness are within normal limits. No mass, perinephric fluid, or hydronephrosis visualized. No sonographically demonstrable calculus or ureterectasis. Left Kidney: Renal measurements: 10.7 x 5.4 x 5.9 cm = volume: 175.7 mL. Echogenicity and renal cortical thickness are within normal limits. No mass, perinephric fluid, or hydronephrosis visualized. There is a 4 mm echogenic focus in the lower pole left kidney which may represent a small calculus period no shadowing in this area is seen, however. There is no ureterectasis. Bladder: Nonvisualized, presumably due to empty state. IMPRESSION: 4 mm suspected nonobstructing calculus lower pole left kidney as noted. Study otherwise unremarkable. Note that urinary bladder not visualized, presumably due to empty state. Electronically Signed   By: Bretta BangWilliam  Woodruff III M.D.   On: 11/16/2018 16:45    PROCEDURES: Procedures  MEDICATIONS RECEIVED THIS VISIT: Medications  ketorolac (TORADOL) injection 60 mg (60 mg Intramuscular Given 11/16/18  1429)    PERTINENT CLINICAL COURSE NOTES/UPDATES:   Initial Impression / Assessment and Plan / Urgent Care Course:  Pertinent labs & imaging results that were available during my care of the patient were personally reviewed by me and considered in my medical decision making (see lab/imaging section of note for values and interpretations).  Catherine Mitchell is a 33 y.o. female who presents to [facility] today with complaints of Flank Pain   Patient is well appearing overall in clinic today. She does not appear to be in any acute distress. Presenting symptoms (see HPI) and exam as documented above. UA negative for infection. (+) NV with no fever. PMH (+) for stones. Attempted to perform CT to further assess for obstructive uropathy, however her insurance would not pay for the study. Insurance  recommending renal US, which we proceeded with. US showed 4 mm non-obstructive stone in the LEFT kidney. There was no hydronephrosis. Patient given IM ketorolac dose in clinic, which improved her symptoms.  Will provide a short course of Percocet for PRN use. Patient encouraged to increase fluid intake and to follow up with PCP and/or urology for continued pain.  Discussed follow up with primary care physician in 1 week for re-evaluation. I have reviewed the follow up and strict return precautions for any new or worsening symptoms. Patient is aware of symptoms that would be deemed urgent/emergent, and would thus require further evaluation either here or in the emergency department. At the time of discharge, she verbalized understanding and consent with the discharge plan as it was reviewed with her. All questions were fielded by provider and/or clinic staff prior to patient discharge.    Final Clinical Impressions / Urgent Care Diagnoses:   Final diagnoses:  Flank pain  History of kidney stones  Kidney stones    New Prescriptions:  [place] Controlled Substance Registry consulted? Yes, I have consulted the [place] Controlled Substances Registry for this patient, and feel the risk/benefit ratio today is favorable for proceeding with this prescription for a controlled substance.  . Discussed use of controlled substance medication to treat her acute pain.  o Reviewed [place] STOP Act regulations  o Clinic does not refill controlled substances over the phone without face to face evaluation.  . Safety precautions reviewed.  o Medications should not be bitten, chewed, crushed, shared, or taken with alcohol.  o Avoid use while working, driving, or operating heavy machinery.  o Side effects associated with the use of this particular medication reviewed. - Patient understands that this medication can cause CNS depression, increase her risk of falls, and even lead to overdose that may result in death, if used outside of  the parameters that she and I discussed.  With all of this in mind, she knowingly accepts the risks and responsibilities associated with intended course of treatment, and elects to responsibly proceed as discussed.  Meds ordered this encounter  Medications  . ketorolac (TORADOL) injection 60 mg  . oxyCODONE-acetaminophen (PERCOCET) 5-325 MG tablet    Sig: Take 1 tablet by mouth 2 (two) times daily as needed for severe pain.    Dispense:  8 tablet    Refill:  0    Recommended Follow up Care:  Patient encouraged to follow up with the following provider within the specified time frame, or sooner as dictated by the severity of her symptoms. As always, she was instructed that for any urgent/emergent care needs, she should seek care either here or in the emergency department for  more immediate evaluation.  Follow-up Information    Celene Squibb, MD In 1 week.   Specialty: Internal Medicine Why: General reassessment of symptoms if not improving Contact information: [facility] [id number] [phone]         NOTE: This note was prepared using Dragon dictation software along with smaller phrase technology. Despite my best ability to proofread, there is the potential that transcriptional errors may still occur from this process, and are completely unintentional.    Karen Kitchens, NP 11/18/18 [phone]

## 2018-12-31 ENCOUNTER — Other Ambulatory Visit: Payer: Self-pay | Admitting: Women's Health

## 2019-01-03 NOTE — Telephone Encounter (Signed)
Past due for annual exam with last one 06/03/17.  No Show appointment 08/17/2018. Now she is scheduled for 01/04/2019.

## 2019-01-03 NOTE — Telephone Encounter (Signed)
Called patient and reminded her she must keep appt tomorrow.  Refill called in.

## 2019-01-03 NOTE — Telephone Encounter (Signed)
Okay for refill but review must keep scheduled appointment

## 2019-01-04 ENCOUNTER — Other Ambulatory Visit: Payer: Self-pay

## 2019-01-04 ENCOUNTER — Ambulatory Visit (INDEPENDENT_AMBULATORY_CARE_PROVIDER_SITE_OTHER): Payer: BLUE CROSS/BLUE SHIELD | Admitting: Women's Health

## 2019-01-04 ENCOUNTER — Encounter: Payer: Self-pay | Admitting: Women's Health

## 2019-01-04 VITALS — BP 122/78 | Ht 68.0 in | Wt 287.0 lb

## 2019-01-04 DIAGNOSIS — Z23 Encounter for immunization: Secondary | ICD-10-CM

## 2019-01-04 DIAGNOSIS — Z113 Encounter for screening for infections with a predominantly sexual mode of transmission: Secondary | ICD-10-CM

## 2019-01-04 DIAGNOSIS — Z01419 Encounter for gynecological examination (general) (routine) without abnormal findings: Secondary | ICD-10-CM | POA: Diagnosis not present

## 2019-01-04 DIAGNOSIS — R635 Abnormal weight gain: Secondary | ICD-10-CM | POA: Diagnosis not present

## 2019-01-04 MED ORDER — NORETHIN-ETH ESTRAD-FE BIPHAS 1 MG-10 MCG / 10 MCG PO TABS: 1.0000 | ORAL_TABLET | Freq: Every day | ORAL | 4 refills | Status: DC

## 2019-01-04 NOTE — Patient Instructions (Signed)
Bariat[facility] center at [place] at [place]   Carbohydrate Counting for Diabetes Mellitus, Adult  Carbohydrate counting is a method of keeping track of how many carbohydrates you eat. Eating carbohydrates naturally increases the amount of sugar (glucose) in the blood. Counting how many carbohydrates you eat helps keep your blood glucose within normal limits, which helps you manage your diabetes (diabetes mellitus). It is important to know how many carbohydrates you can safely have in each meal. This is different for every person. A diet and nutrition specialist (registered dietitian) can help you make a meal plan and calculate how many carbohydrates you should have at each meal and snack. Carbohydrates are found in the following foods:  Grains, such as breads and cereals.  Dried beans and soy products.  Starchy vegetables, such as potatoes, peas, and corn.  Fruit and fruit juices.  Milk and yogurt.  Sweets and snack foods, such as cake, cookies, candy, chips, and soft drinks. How do I count carbohydrates? There are two ways to count carbohydrates in food. You can use either of the methods or a combination of both. Reading "Nutrition Facts" on packaged food The "Nutrition Facts" list is included on the labels of almost all packaged foods and beverages in the [place] It includes:  The serving size.  Information about nutrients in each serving, including the grams (g) of carbohydrate per serving. To use the "Nutrition Facts":  Decide how many servings you will have.  Multiply the number of servings by the number of carbohydrates per serving.  The resulting number is the total amount of carbohydrates that you will be having. Learning standard serving sizes of other foods When you eat carbohydrate foods that are not packaged or do not include "Nutrition Facts" on the label, you need to measure the servings in order to count the amount of carbohydrates:  Measure the  foods that you will eat with a food scale or measuring cup, if needed.  Decide how many standard-size servings you will eat.  Multiply the number of servings by 15. Most carbohydrate-rich foods have about 15 g of carbohydrates per serving. ? For example, if you eat 8 oz (170 g) of strawberries, you will have eaten 2 servings and 30 g of carbohydrates (2 servings x 15 g = 30 g).  For foods that have more than one food mixed, such as soups and casseroles, you must count the carbohydrates in each food that is included. The following list contains standard serving sizes of common carbohydrate-rich foods. Each of these servings has about 15 g of carbohydrates:   hamburger bun or  English muffin.   oz (15 mL) syrup.   oz (14 g) jelly.  1 slice of bread.  1 six-inch tortilla.  3 oz (85 g) cooked rice or pasta.  4 oz (113 g) cooked dried beans.  4 oz (113 g) starchy vegetable, such as peas, corn, or potatoes.  4 oz (113 g) hot cereal.  4 oz (113 g) mashed potatoes or  of a large baked potato.  4 oz (113 g) canned or frozen fruit.  4 oz (120 mL) fruit juice.  4-6 crackers.  6 chicken nuggets.  6 oz (170 g) unsweetened dry cereal.  6 oz (170 g) plain fat-free yogurt or yogurt sweetened with artificial sweeteners.  8 oz (240 mL) milk.  8 oz (170 g) fresh fruit or one small piece of fruit.  24 oz (680 g) popped popcorn. Example of carbohydrate counting Sample  meal  3 oz (85 g) chicken breast.  6 oz (170 g) brown rice.  4 oz (113 g) corn.  8 oz (240 mL) milk.  8 oz (170 g) strawberries with sugar-free whipped topping. Carbohydrate calculation 1. Identify the foods that contain carbohydrates: ? Rice. ? Corn. ? Milk. ? Strawberries. 2. Calculate how many servings you have of each food: ? 2 servings rice. ? 1 serving corn. ? 1 serving milk. ? 1 serving strawberries. 3. Multiply each number of servings by 15 g: ? 2 servings rice x 15 g = 30 g. ? 1 serving  corn x 15 g = 15 g. ? 1 serving milk x 15 g = 15 g. ? 1 serving strawberries x 15 g = 15 g. 4. Add together all of the amounts to find the total grams of carbohydrates eaten: ? 30 g + 15 g + 15 g + 15 g = 75 g of carbohydrates total. Summary  Carbohydrate counting is a method of keeping track of how many carbohydrates you eat.  Eating carbohydrates naturally increases the amount of sugar (glucose) in the blood.  Counting how many carbohydrates you eat helps keep your blood glucose within normal limits, which helps you manage your diabetes.  A diet and nutrition specialist (registered dietitian) can help you make a meal plan and calculate how many carbohydrates you should have at each meal and snack. This information is not intended to replace advice given to you by your health care provider. Make sure you discuss any questions you have with your health care provider. Document Released: 01/20/2005 Document Revised: 08/14/2016 Document Reviewed: 07/04/2015 Elsevier Patient Education  2020 [employer]  Health Maintenance, Female Adopting a healthy lifestyle and getting preventive care are important in promoting health and wellness. Ask your health care provider about:  The right schedule for you to have regular tests and exams.  Things you can do on your own to prevent diseases and keep yourself healthy. What should I know about diet, weight, and exercise? Eat a healthy diet   Eat a diet that includes plenty of vegetables, fruits, low-fat dairy products, and lean protein.  Do not eat a lot of foods that are high in solid fats, added sugars, or sodium. Maintain a healthy weight Body mass index (BMI) is used to identify weight problems. It estimates body fat based on height and weight. Your health care provider can help determine your BMI and help you achieve or maintain a healthy weight. Get regular exercise Get regular exercise. This is one of the most important things you can do  for your health. Most adults should:  Exercise for at least 150 minutes each week. The exercise should increase your heart rate and make you sweat (moderate-intensity exercise).  Do strengthening exercises at least twice a week. This is in addition to the moderate-intensity exercise.  Spend less time sitting. Even light physical activity can be beneficial. Watch cholesterol and blood lipids Have your blood tested for lipids and cholesterol at 32 years of age, then have this test every 5 years. Have your cholesterol levels checked more often if:  Your lipid or cholesterol levels are high.  You are older than 32 years of age.  You are at high risk for heart disease. What should I know about cancer screening? Depending on your health history and family history, you may need to have cancer screening at various ages. This may include screening for:  Breast cancer.  Cervical cancer.  Colorectal cancer.  Skin cancer.  Lung cancer. What should I know about heart disease, diabetes, and high blood pressure? Blood pressure and heart disease  High blood pressure causes heart disease and increases the risk of stroke. This is more likely to develop in people who have high blood pressure readings, are of African descent, or are overweight.  Have your blood pressure checked: ? Every 3-5 years if you are 52-97 years of age. ? Every year if you are 91 years old or older. Diabetes Have regular diabetes screenings. This checks your fasting blood sugar level. Have the screening done:  Once every three years after age 56 if you are at a normal weight and have a low risk for diabetes.  More often and at a younger age if you are overweight or have a high risk for diabetes. What should I know about preventing infection? Hepatitis B If you have a higher risk for hepatitis B, you should be screened for this virus. Talk with your health care provider to find out if you are at risk for hepatitis B  infection. Hepatitis C Testing is recommended for:  Everyone born from [birth year] through 1964.  Anyone with known risk factors for hepatitis C. Sexually transmitted infections (STIs)  Get screened for STIs, including gonorrhea and chlamydia, if: ? You are sexually active and are younger than 32 years of age. ? You are older than 32 years of age and your health care provider tells you that you are at risk for this type of infection. ? Your sexual activity has changed since you were last screened, and you are at increased risk for chlamydia or gonorrhea. Ask your health care provider if you are at risk.  Ask your health care provider about whether you are at high risk for HIV. Your health care provider may recommend a prescription medicine to help prevent HIV infection. If you choose to take medicine to prevent HIV, you should first get tested for HIV. You should then be tested every 3 months for as long as you are taking the medicine. Pregnancy  If you are about to stop having your period (premenopausal) and you may become pregnant, seek counseling before you get pregnant.  Take 400 to 800 micrograms (mcg) of folic acid every day if you become pregnant.  Ask for birth control (contraception) if you want to prevent pregnancy. Osteoporosis and menopause Osteoporosis is a disease in which the bones lose minerals and strength with aging. This can result in bone fractures. If you are 70 years old or older, or if you are at risk for osteoporosis and fractures, ask your health care provider if you should:  Be screened for bone loss.  Take a calcium or vitamin D supplement to lower your risk of fractures.  Be given hormone replacement therapy (HRT) to treat symptoms of menopause. Follow these instructions at home: Lifestyle  Do not use any products that contain nicotine or tobacco, such as cigarettes, e-cigarettes, and chewing tobacco. If you need help quitting, ask your health care provider.   Do not use street drugs.  Do not share needles.  Ask your health care provider for help if you need support or information about quitting drugs. Alcohol use  Do not drink alcohol if: ? Your health care provider tells you not to drink. ? You are pregnant, may be pregnant, or are planning to become pregnant.  If you drink alcohol: ? Limit how much you use to 0-1 drink a day. ? Limit intake if you  are breastfeeding.  Be aware of how much alcohol is in your drink. In the [place] one drink equals one 12 oz bottle of beer (355 mL), one 5 oz glass of wine (148 mL), or one 1 oz glass of hard liquor (44 mL). General instructions  Schedule regular health, dental, and eye exams.  Stay current with your vaccines.  Tell your health care provider if: ? You often feel depressed. ? You have ever been abused or do not feel safe at home. Summary  Adopting a healthy lifestyle and getting preventive care are important in promoting health and wellness.  Follow your health care provider's instructions about healthy diet, exercising, and getting tested or screened for diseases.  Follow your health care provider's instructions on monitoring your cholesterol and blood pressure. This information is not intended to replace advice given to you by your health care provider. Make sure you discuss any questions you have with your health care provider. Document Released: 08/05/2010 Document Revised: 01/13/2018 Document Reviewed: 01/13/2018 Elsevier Patient Education  2020 [employer].

## 2019-01-04 NOTE — Progress Notes (Signed)
Catherine Mitchell October 22, 1985 [id number]    History:    Presents for annual exam.  Monthly cycle with increased cramping and questions if she has endometriosis.  Had a ParaGard IUD had taken out due to increased menstrual cramping.  States had weight gain with birth control pills in the past.  Being treated by psychiatrist for anxiety and depression.  New partner.  Normal Pap history.  States having increased bloating with menstrual cycle and took 1 her mother's "fluid pills" with good relief.  Past medical history, past surgical history, family history and social history were all reviewed and documented in the EPIC chart.  Was engaged to be married in 2018 broke off the engagement.  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  Weight is up 30 pounds from last office visit 2 years ago and up 50 pounds from 4 years ago.  ROS:  A ROS was performed and pertinent positives and negatives are included.  Exam:  Vitals:   01/04/19 1631  BP: 122/78  Weight: 287 lb (130.2 kg)  Height: 5\' 8"  (1.727 m)   Body mass index is 43.64 kg/m.   General appearance:  Normal Thyroid:  Symmetrical, normal in size, without palpable masses or nodularity. Respiratory  Auscultation:  Clear without wheezing or rhonchi Cardiovascular  Auscultation:  Regular rate, without rubs, murmurs or gallops  Edema/varicosities:  Not grossly evident Abdominal  Soft,nontender, without masses, guarding or rebound.  Liver/spleen:  No organomegaly noted  Hernia:  None appreciated  Skin  Inspection:  Grossly normal   Breasts: Examined lying and sitting.     Right: Without masses, retractions, discharge or axillary adenopathy.     Left: Without masses, retractions, discharge or axillary adenopathy. Gentitourinary   Inguinal/mons:  Normal without inguinal adenopathy  External genitalia:  Normal  BUS/Urethra/Skene's glands:  Normal  Vagina:  Normal  Cervix:   Normal  Uterus: normal in size, shape and contour.  Midline and mobile  Adnexa/parametria:     Rt: Without masses or tenderness.   Lt: Without masses or tenderness.  Anus and perineum: Normal  Digital rectal exam: Normal sphincter tone without palpated masses or tenderness  Assessment/Plan:  33 y.o. S WF G0 for annual exam with increased menstrual cramping with cycles.  Regular monthly cycle/dysmenorrhea/condoms STD screen Anxiety/depression-psychiatrist manages meds Morbid obesity   plan: Long discussion on need to increase exercise and decrease calorie/carbs, low-carb, weight watchers, [employer], discussed.  Reviewed bloating, fluid weight gain will be much less with weight loss.  Painful menstrual cycles reviewed, reviewed possibly could be endometriosis, options reviewed, lo Loestrin prescription, proper use given and reviewed start up instructions.  Condoms especially first month and for infection control.  Reviewed slight risks of blood clots and strokes.  CBC, CMP, TSH, GC/chlamydia, HIV, RPR, Pap normal 2018, new screening guidelines reviewed.    Catherine Mitchell, 5:06 PM 01/04/2019

## 2019-01-05 NOTE — Addendum Note (Signed)
Addended by: Lorine Bears on: 01/05/2019 08:45 AM   Modules accepted: Orders

## 2019-01-06 LAB — C. TRACHOMATIS/N. GONORRHOEAE RNA
C. trachomatis RNA, TMA: NOT DETECTED
N. gonorrhoeae RNA, TMA: NOT DETECTED

## 2019-01-11 ENCOUNTER — Other Ambulatory Visit: Payer: Self-pay | Admitting: Women's Health

## 2019-01-11 NOTE — Telephone Encounter (Signed)
deny refill.  Please call and review best not to take this medicine on a regular basis, may be best to see a counselor to see if she needs a daily antidepressant antianxiety medication.

## 2019-01-11 NOTE — Telephone Encounter (Signed)
ok 

## 2019-01-11 NOTE — Telephone Encounter (Signed)
[facility] prescribed #15 on 01/03/19. Pharmacist said she did pick them up that day and patient was the one who requested refill today.

## 2019-01-11 NOTE — Telephone Encounter (Signed)
I called patient and she said "that was an accident". She did not need refill. I read her your note. She said she already has a psychiatrist

## 2019-02-25 ENCOUNTER — Ambulatory Visit (INDEPENDENT_AMBULATORY_CARE_PROVIDER_SITE_OTHER)
Admission: RE | Admit: 2019-02-25 | Discharge: 2019-02-25 | Disposition: A | Discharge status: Home / Self Care | Payer: BLUE CROSS/BLUE SHIELD | Source: Ambulatory Visit

## 2019-02-25 DIAGNOSIS — J209 Acute bronchitis, unspecified: Secondary | ICD-10-CM | POA: Diagnosis not present

## 2019-02-25 MED ORDER — PREDNISONE 10 MG PO TABS: 40.0000 mg | ORAL_TABLET | Freq: Every day | ORAL | 0 refills | Status: AC

## 2019-02-25 MED ORDER — ALBUTEROL SULFATE HFA 108 (90 BASE) MCG/ACT IN AERS: 2.0000 | INHALATION_SPRAY | RESPIRATORY_TRACT | 0 refills | Status: DC | PRN

## 2019-02-25 MED ORDER — AMOXICILLIN 875 MG PO TABS: 875.0000 mg | ORAL_TABLET | Freq: Two times a day (BID) | ORAL | 0 refills | Status: AC

## 2019-02-25 MED ORDER — GUAIFENESIN-CODEINE 100-10 MG/5ML PO SOLN: 5.0000 mL | Freq: Three times a day (TID) | ORAL | 0 refills | Status: DC | PRN

## 2019-02-25 NOTE — ED Provider Notes (Signed)
Virtual Visit via Video Note:  Catherine Mitchell  initiated request for Telemedicine visit with [facility] team. I connected with Catherine Mitchell  on 02/25/2019 at 1:49 PM  for a synchronized telemedicine visit using a video enabled HIPPA compliant telemedicine application. I verified that I am speaking with Catherine Mitchell  using two identifiers. Catherine Balloon, NP  was physically located in a [facility]gent care site and Catherine Mitchell was located at a different location.   The limitations of evaluation and management by telemedicine as well as the availability of in-person appointments were discussed. Patient was informed that she  may incur a bill ( including co-pay) for this virtual visit encounter. Catherine Mitchell  expressed understanding and gave verbal consent to proceed with virtual visit.     History of Present Illness:Catherine Mitchell  is a 34 y.o. female presents for evaluation of 1 week history of cough productive of yellow mucous, nasal congestion, rhinorrhea, ears stopped up, and mild shortness of breath with activity.  Patient states she is tested weekly for COVID and has been negative.  She has been treating her symptoms at home with Mucinex and Tessalon Perles with no relief.  She denies fever, chills, vomiting, diarrhea, rash, or other symptoms.     Allergies  Allergen Reactions  . Sulfur Other (See Comments)    Throat swells,blisters  . Latex     BREAKS OUT HANDS  . Tomato     GI UPSET     Past Medical History:  Diagnosis Date  . Depression   . HSV (herpes simplex virus) anogenital infection   . Hypoglycemia   . Kidney stone      Social History   Tobacco Use  . Smoking status: Never Smoker  . Smokeless tobacco: Never Used  Substance Use Topics  . Alcohol use: Not Currently    Alcohol/week: 0.0 standard drinks  . Drug use: No        Observations/Objective: Physical Exam   VITALS: Patient denies fever. GENERAL: Alert, appears  well and in no acute distress. HEENT: Atraumatic. NECK: Normal movements of the head and neck. CARDIOPULMONARY: No increased WOB. Speaking in clear sentences. I:E ratio WNL.  MS: Moves all visible extremities without noticeable abnormality. PSYCH: Pleasant and cooperative, well-groomed. Speech normal rate and rhythm. Affect is appropriate. Insight and judgement are appropriate. Attention is focused, linear, and appropriate.  NEURO: CN grossly intact. Oriented as arrived to appointment on time with no prompting. Moves both UE equally.  SKIN: No obvious lesions, wounds, erythema, or cyanosis noted on face or hands.   Assessment and Plan:    ICD-10-CM   1. Acute bronchitis, unspecified organism  J20.9        Follow Up Instructions: Treating with albuterol inhaler, amoxicillin, prednisone, Robitussin-AC.  Precautions for drowsiness with Robitussin-AC discussed with patient.  Instructed patient to follow-up with her PCP or come here to be seen in person if her symptoms are not improving.  Patient agrees to plan of care.      I discussed the assessment and treatment plan with the patient. The patient was provided an opportunity to ask questions and all were answered. The patient agreed with the plan and demonstrated an understanding of the instructions.   The patient was advised to call back or seek an in-person evaluation if the symptoms worsen or if the condition fails to improve as anticipated.      Catherine Balloon, NP  02/25/2019 1:49 PM         Catherine Bail, NP 02/25/19 1349

## 2019-02-25 NOTE — Discharge Instructions (Addendum)
Use the albuterol inhaler; and take the amoxicillin and prednisone as directed.  Use the cough medication as needed; do not drive, operate machinery, or drink alcohol with this medication as it may cause drowsiness.    Follow-up with your primary care provider or come here to be seen in person if your symptoms are not improving.

## 2019-04-18 ENCOUNTER — Encounter: Payer: Self-pay | Admitting: Emergency Medicine

## 2019-04-18 ENCOUNTER — Other Ambulatory Visit: Payer: Self-pay

## 2019-04-18 ENCOUNTER — Ambulatory Visit (INDEPENDENT_AMBULATORY_CARE_PROVIDER_SITE_OTHER): Payer: BLUE CROSS/BLUE SHIELD

## 2019-04-18 ENCOUNTER — Ambulatory Visit
Admission: EM | Admit: 2019-04-18 | Discharge: 2019-04-18 | Disposition: A | Discharge status: Home / Self Care | Payer: BLUE CROSS/BLUE SHIELD | Attending: Family Medicine | Admitting: Family Medicine

## 2019-04-18 DIAGNOSIS — M62838 Other muscle spasm: Secondary | ICD-10-CM | POA: Diagnosis not present

## 2019-04-18 DIAGNOSIS — M542 Cervicalgia: Secondary | ICD-10-CM | POA: Diagnosis not present

## 2019-04-18 DIAGNOSIS — M5412 Radiculopathy, cervical region: Secondary | ICD-10-CM

## 2019-04-18 DIAGNOSIS — X500XXA Overexertion from strenuous movement or load, initial encounter: Secondary | ICD-10-CM

## 2019-04-18 MED ORDER — TIZANIDINE HCL 4 MG PO TABS: 4.0000 mg | ORAL_TABLET | Freq: Four times a day (QID) | ORAL | 0 refills | Status: DC | PRN

## 2019-04-18 MED ORDER — KETOROLAC TROMETHAMINE 10 MG PO TABS: 10.0000 mg | ORAL_TABLET | Freq: Four times a day (QID) | ORAL | 0 refills | Status: DC | PRN

## 2019-04-18 NOTE — ED Triage Notes (Signed)
Pt c/o neck pain on the right side and right shoulder pain. She states that it started after moving a desk about 3 weeks ago. She states that she has numbness and tingling down her right arm as well and the pain is keeping her from being able to type for her job and sleep.
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
## 2019-04-18 NOTE — Discharge Instructions (Addendum)
Heat.  Medication as prescribed.  If persists, recommend neurosurgery evaluation/MRI.  Take care  Dr. Adriana Simas

## 2019-06-04 ENCOUNTER — Other Ambulatory Visit: Payer: Self-pay

## 2019-06-04 ENCOUNTER — Emergency Department: Payer: BLUE CROSS/BLUE SHIELD

## 2019-06-04 ENCOUNTER — Emergency Department
Admission: EM | Admit: 2019-06-04 | Discharge: 2019-06-04 | Disposition: A | Discharge status: Home / Self Care | Payer: BLUE CROSS/BLUE SHIELD | Attending: Emergency Medicine | Admitting: Emergency Medicine

## 2019-06-04 DIAGNOSIS — Z79899 Other long term (current) drug therapy: Secondary | ICD-10-CM | POA: Insufficient documentation

## 2019-06-04 DIAGNOSIS — M25562 Pain in left knee: Secondary | ICD-10-CM | POA: Insufficient documentation

## 2019-06-04 DIAGNOSIS — W010XXA Fall on same level from slipping, tripping and stumbling without subsequent striking against object, initial encounter: Secondary | ICD-10-CM | POA: Insufficient documentation

## 2019-06-04 DIAGNOSIS — Y92512 Supermarket, store or market as the place of occurrence of the external cause: Secondary | ICD-10-CM | POA: Insufficient documentation

## 2019-06-04 DIAGNOSIS — W19XXXA Unspecified fall, initial encounter: Secondary | ICD-10-CM

## 2019-06-04 DIAGNOSIS — M25572 Pain in left ankle and joints of left foot: Secondary | ICD-10-CM | POA: Insufficient documentation

## 2019-06-04 MED ORDER — KETOROLAC TROMETHAMINE 10 MG PO TABS: 10.0000 mg | ORAL_TABLET | Freq: Four times a day (QID) | ORAL | 0 refills | Status: AC | PRN

## 2019-06-04 MED ORDER — KETOROLAC TROMETHAMINE 30 MG/ML IJ SOLN
30.0000 mg | Freq: Once | INTRAMUSCULAR | Status: AC
  Administered 2019-06-04: 30 mg via INTRAMUSCULAR
  Filled 2019-06-04: qty 1

## 2019-06-04 MED ORDER — HYDROCODONE-ACETAMINOPHEN 5-325 MG PO TABS: 1.0000 | ORAL_TABLET | Freq: Four times a day (QID) | ORAL | 0 refills | Status: AC | PRN

## 2019-06-04 MED ORDER — ONDANSETRON 4 MG PO TBDP
4.0000 mg | ORAL_TABLET | Freq: Once | ORAL | Status: AC
  Administered 2019-06-04: 4 mg via ORAL
  Filled 2019-06-04: qty 1

## 2019-06-04 MED ORDER — HYDROCODONE-ACETAMINOPHEN 5-325 MG PO TABS
1.0000 | ORAL_TABLET | Freq: Once | ORAL | Status: AC
  Administered 2019-06-04: 1 via ORAL
  Filled 2019-06-04: qty 1

## 2019-06-04 NOTE — ED Provider Notes (Signed)
Emergency Department Provider Note  ____________________________________________  Time seen: Approximately 8:53 PM  I have reviewed the triage vital signs and the nursing notes.   HISTORY  Chief Complaint Fall   Historian Patient     HPI Catherine Mitchell is a 34 y.o. female presents to the emergency department with left knee and left ankle pain after patient had a mechanical fall at [employer].  Patient states that she slipped on some spilled salad dressing.  She did not hit her head or neck.  No numbness or tingling in the upper and lower extremities.  No weakness.  No abrasions or lacerations.  No other alleviating measures have been attempted.   Past Medical History:  Diagnosis Date  . Depression   . HSV (herpes simplex virus) anogenital infection   . Hypoglycemia   . Kidney stone      Immunizations up to date:  Yes.     Past Medical History:  Diagnosis Date  . Depression   . HSV (herpes simplex virus) anogenital infection   . Hypoglycemia   . Kidney stone     Patient Active Problem List   Diagnosis Date Noted  . Kidney stone 11/16/2018  . Yeast vaginitis 12/17/2016  . H N P-CERVICAL 09/18/2008  . Sprain of neck 08/30/2008    Past Surgical History:  Procedure Laterality Date  . INTRAUTERINE DEVICE INSERTION  04/20/2013   ParaGard  . nexplanon removal  2015  . URETHRA SURGERY  had it stretched as a child    Prior to Admission medications   Medication Sig Start Date End Date Taking? Authorizing Provider  albuterol (VENTOLIN HFA) 108 (90 Base) MCG/ACT inhaler Inhale 2 puffs into the lungs every 4 (four) hours as needed for wheezing or shortness of breath. 02/25/19   Mickie Bail, NP  ALPRAZolam Prudy Feeler) 0.25 MG tablet TAKE 1 TABLET BY MOUTH EVERY NIGHT AT BEDTIME AS NEEDED FOR ANXIETY 01/03/19   Harrington Challenger, NP  gabapentin (NEURONTIN) 600 MG tablet Take 600 mg by mouth 3 (three) times daily.    [provider]  HYDROcodone-acetaminophen  (NORCO) 5-325 MG tablet Take 1 tablet by mouth every 6 (six) hours as needed for up to 3 days for moderate pain. 06/04/19 06/07/19  Orvil Feil, PA-C  ketorolac (TORADOL) 10 MG tablet Take 1 tablet (10 mg total) by mouth every 6 (six) hours as needed for up to 5 days. 06/04/19 06/09/19  Orvil Feil, PA-C  tiZANidine (ZANAFLEX) 4 MG tablet Take 1 tablet (4 mg total) by mouth every 6 (six) hours as needed for muscle spasms. 04/18/19   Tommie Sams, DO  venlafaxine XR (EFFEXOR-XR) 75 MG 24 hr capsule Take 225 mg by mouth daily with breakfast.    [provider]  methylphenidate 54 MG PO CR tablet Take 54 mg by mouth every morning.  04/18/19  [provider]  norethindrone-ethinyl estradiol (JUNEL FE,GILDESS FE,LOESTRIN FE) 1-20 MG-MCG tablet Take 1 tablet by mouth daily. 08/18/16 07/27/18  Harrington Challenger, NP  Norethindrone-Ethinyl Estradiol-Fe Biphas (LO LOESTRIN FE) 1 MG-10 MCG / 10 MCG tablet Take 1 tablet by mouth daily. 01/04/19 04/18/19  Harrington Challenger, NP  topiramate (TOPAMAX) 50 MG tablet Take 50 mg by mouth daily.  07/27/18  [provider]    Allergies Sulfur, Latex, and Tomato  Family History  Problem Relation Age of Onset  . Diabetes Mother   . Thyroid disease Mother   . Diabetes Maternal Grandmother   .  Hypertension Father     Social History Social History   Tobacco Use  . Smoking status: Never Smoker  . Smokeless tobacco: Never Used  Substance Use Topics  . Alcohol use: Not Currently    Alcohol/week: 0.0 standard drinks  . Drug use: No     Review of Systems  Constitutional: No fever/chills Eyes:  No discharge ENT: No upper respiratory complaints. Respiratory: no cough. No SOB/ use of accessory muscles to breath Gastrointestinal:   No nausea, no vomiting.  No diarrhea.  No constipation. Musculoskeletal: Patient has left knee pain and left ankle pain.  Skin: Negative for rash, abrasions, lacerations,  ecchymosis.    ____________________________________________   PHYSICAL EXAM:  VITAL SIGNS: ED Triage Vitals  Enc Vitals Group     BP 06/04/19 1934 116/70     Pulse Rate 06/04/19 1934 66     Resp 06/04/19 1934 19     Temp 06/04/19 1934 98 F (36.7 C)     Temp src --      SpO2 06/04/19 1934 98 %     Weight 06/04/19 1935 280 lb (127 kg)     Height 06/04/19 1935 5\' 8"  (1.727 m)     Head Circumference --      Peak Flow --      Pain Score 06/04/19 1935 10     Pain Loc --      Pain Edu? --      Excl. in [place]? --      Constitutional: Alert and oriented. Well appearing and in no acute distress. Eyes: Conjunctivae are normal. PERRL. EOMI. Head: Atraumatic. Cardiovascular: Normal rate, regular rhythm. Normal S1 and S2.  Good peripheral circulation. Respiratory: Normal respiratory effort without tachypnea or retractions. Lungs CTAB. Good air entry to the bases with no decreased or absent breath sounds Gastrointestinal: Bowel sounds x 4 quadrants. Soft and nontender to palpation. No guarding or rigidity. No distention. Musculoskeletal: Provocative testing at the left knee is limited due to pain.  Patient has some mild soft tissue swelling over the lateral malleolus of the left ankle.  Palpable dorsalis pedis pulse bilaterally and symmetrically. Neurologic:  Normal for age. No gross focal neurologic deficits are appreciated.  Skin:  Skin is warm, dry and intact. No rash noted. Psychiatric: Mood and affect are normal for age. Speech and behavior are normal.   ____________________________________________   LABS (all labs ordered are listed, but only abnormal results are displayed)  Labs Reviewed - No data to display ____________________________________________  EKG   ____________________________________________  RADIOLOGY Unk Pinto, personally viewed and evaluated these images (plain radiographs) as part of my medical decision making, as well as reviewing the written  report by the radiologist.  DG Ankle Complete Left  Result Date: 06/04/2019 CLINICAL DATA:  Status post fall. EXAM: LEFT ANKLE COMPLETE - 3+ VIEW COMPARISON:  None. FINDINGS: There is no evidence of fracture, dislocation, or joint effusion. There is no evidence of arthropathy or other focal bone abnormality. There is mild diffuse soft tissue swelling. IMPRESSION: Mild diffuse soft tissue swelling without evidence of acute osseous abnormality. Electronically Signed   By: Virgina Norfolk M.D.   On: 06/04/2019 20:25   DG Knee Complete 4 Views Left  Result Date: 06/04/2019 CLINICAL DATA:  Status post fall. EXAM: LEFT KNEE - COMPLETE 4+ VIEW COMPARISON:  None. FINDINGS: No evidence of an acute fracture or dislocation. No evidence of arthropathy or other focal bone abnormality. A small joint effusion is noted. Soft tissues  are otherwise unremarkable. IMPRESSION: Small joint effusion without evidence of acute osseous abnormality. Electronically Signed   By: Aram Candela M.D.   On: 06/04/2019 20:24    ____________________________________________    PROCEDURES  Procedure(s) performed:     Procedures     Medications  HYDROcodone-acetaminophen (NORCO/VICODIN) 5-325 MG per tablet 1 tablet (1 tablet Oral Given 06/04/19 1949)  ondansetron (ZOFRAN-ODT) disintegrating tablet 4 mg (4 mg Oral Given 06/04/19 1948)  ketorolac (TORADOL) 30 MG/ML injection 30 mg (30 mg Intramuscular Given 06/04/19 2051)     ____________________________________________   INITIAL IMPRESSION / ASSESSMENT AND PLAN / ED COURSE  Pertinent labs & imaging results that were available during my care of the patient were reviewed by me and considered in my medical decision making (see chart for details).      Assessment and plan Fall 34 year old female presents to the emergency department after a mechanical fall.  X-ray examination of the left knee and the left ankle reveals no bony abnormality.  A postop shoe and a left  knee sleeve was provided in the emergency department.  Patient was discharged with a short course of Norco and Toradol.  She was given Toradol and Norco in the emergency department tonight.  She was advised to follow-up with orthopedics, Dr. Joice Lofts.  Return precautions were given to return with new or worsening symptoms.     ____________________________________________  FINAL CLINICAL IMPRESSION(S) / ED DIAGNOSES  Final diagnoses:  Fall, initial encounter      NEW MEDICATIONS STARTED DURING THIS VISIT:  ED Discharge Orders         Ordered    HYDROcodone-acetaminophen (NORCO) 5-325 MG tablet  Every 6 hours PRN     06/04/19 2040    ketorolac (TORADOL) 10 MG tablet  Every 6 hours PRN     06/04/19 2040              This chart was dictated using voice recognition software/Dragon. Despite best efforts to proofread, errors can occur which can change the meaning. Any change was purely unintentional.     Gasper Lloyd 06/04/19 2056    Sharman Cheek, MD 06/04/19 2358

## 2019-06-04 NOTE — Discharge Instructions (Signed)
Toradol and Norco to be taken for pain.

## 2019-06-04 NOTE — ED Notes (Addendum)
Ice applied to left ankle and left knee. Leg elevated on pillow.

## 2019-06-04 NOTE — ED Triage Notes (Signed)
Pt slipped in puddle in [employer] and injured left knee and left ankle. Pt denied hitting head, denies LOC. No deformity noted, capillary refill less than 3 seconds, sensation intact, pain with movement. Swelling noted in left knee, no bruising or bleeding noted. Pt tearful, AOx4.

## 2019-06-08 ENCOUNTER — Telehealth (HOSPITAL_COMMUNITY): Payer: Self-pay | Admitting: Family Medicine

## 2019-06-08 ENCOUNTER — Ambulatory Visit (INDEPENDENT_AMBULATORY_CARE_PROVIDER_SITE_OTHER)
Admission: RE | Admit: 2019-06-08 | Discharge: 2019-06-08 | Disposition: A | Discharge status: Home / Self Care | Payer: BLUE CROSS/BLUE SHIELD | Source: Ambulatory Visit

## 2019-06-08 DIAGNOSIS — J209 Acute bronchitis, unspecified: Secondary | ICD-10-CM

## 2019-06-08 MED ORDER — HYDROCODONE-HOMATROPINE 5-1.5 MG/5ML PO SYRP: 5.0000 mL | ORAL_SOLUTION | Freq: Four times a day (QID) | ORAL | 0 refills | Status: DC | PRN

## 2019-06-08 MED ORDER — DOXYCYCLINE HYCLATE 100 MG PO CAPS: 100.0000 mg | ORAL_CAPSULE | Freq: Two times a day (BID) | ORAL | 0 refills | Status: DC

## 2019-06-08 MED ORDER — BENZONATATE 200 MG PO CAPS: 200.0000 mg | ORAL_CAPSULE | Freq: Three times a day (TID) | ORAL | 0 refills | Status: DC

## 2019-06-08 MED ORDER — IPRATROPIUM BROMIDE 0.06 % NA SOLN: 2.0000 | Freq: Four times a day (QID) | NASAL | 0 refills | Status: DC

## 2019-06-08 NOTE — ED Provider Notes (Signed)
Virtual Visit via Video Note:  Catherine Mitchell  initiated request for Telemedicine visit with [facility] team. I connected with Catherine Mitchell  on 06/08/2019 at 4:50 PM  for a synchronized telemedicine visit using a video enabled HIPPA compliant telemedicine application. I verified that I am speaking with Catherine Mitchell  using two identifiers. Mari Battaglia Doree Fudge, PA-C  was physically located in a [facility] Urgent care site and [name] was located at a different location.   The limitations of evaluation and management by telemedicine as well as the availability of in-person appointments were discussed. Patient was informed that she  may incur a bill ( including co-pay) for this virtual visit encounter. Catherine Mitchell  expressed understanding and gave verbal consent to proceed with virtual visit.     History of Present Illness:Catherine Mitchell  is a 34 y.o. female presents with 5 day history of URI symptoms. Negative COVID testing. Nasal congestion, drainage, cough, rhinorrhea. Fever tmax 101.6, responsive to antipyretics. Has had dyspnea on exertion, but still able to do normal activity. Shob improves with albuterol. Never smoker.     Past Medical History:  Diagnosis Date  . Depression   . HSV (herpes simplex virus) anogenital infection   . Hypoglycemia   . Kidney stone     Allergies  Allergen Reactions  . Sulfur Other (See Comments)    Throat swells,blisters  . Latex     BREAKS OUT HANDS  . Tomato     GI UPSET        Observations/Objective: General: Well appearing, nontoxic, no acute distress. Sitting comfortably. Head: Normocephalic, atraumatic Eye: No conjunctival injection, eyelid swelling. EOMI ENT: Mucus membranes moist, no lip cracking. No obvious nasal drainage. Tender to tenderness to bilateral sinus processes. Pulm: Speaking in full sentences without difficulty. Normal effort. No respiratory distress, accessory muscle use. Neuro: Normal mental  status. Alert and oriented x 3.   Assessment and Plan: Patient has been seen multiple times in the past few months for similar symptoms. Discussed will cover for bacterial bronchitis with doxycycline due to fever, shob. However, strongly encouraged PCP follow up due to frequent URIs and abx use. Will provide other symptomatic treatment. If symptoms not improving, will need to follow up in person for further evaluation. Return precautions given. Patient expresses understanding and agrees to plan.  Follow Up Instructions:    I discussed the assessment and treatment plan with the patient. The patient was provided an opportunity to ask questions and all were answered. The patient agreed with the plan and demonstrated an understanding of the instructions.   The patient was advised to call back or seek an in-person evaluation if the symptoms worsen or if the condition fails to improve as anticipated.  I provided 15 minutes of non-face-to-face time during this encounter.    Belinda Fisher, PA-C  06/08/2019 4:50 PM        Belinda Fisher, PA-C 06/08/19 1706

## 2019-06-08 NOTE — Telephone Encounter (Signed)
Reports allergy to Jerilynn Som per nurse. Requests alternative.  Sent to pharmacy on file.

## 2019-06-08 NOTE — Discharge Instructions (Signed)
Start doxycycline as directed to cover for bacterial infection. Tessalon and atrovent as needed for symptomatic management. Continue albuterol. If symptoms not improving, please follow up in person for evaluation. If worsening shortness of breath, trouble breathing, go to the ED for further evaluation.  [facility] at [facility]) [address] [place], [place], [place] [address] [phone]  [facility] at [facility] [address] [place], [place] [address] [phone]  [facility] at [facility] [address] [place], [place] [address] [phone]  [facility] at [facility] [address], [place], [place] [address] [phone]  [facility] at [facility] [address] Evonnie Dawes [place], [place] [address] [phone]  [facility] at [facility] [address], [place], [place] [address] [phone]

## 2019-08-30 ENCOUNTER — Telehealth: Payer: Self-pay | Admitting: *Deleted

## 2019-08-30 MED ORDER — FLUCONAZOLE 150 MG PO TABS: ORAL_TABLET | ORAL | 0 refills | Status: DC

## 2019-08-30 NOTE — Telephone Encounter (Signed)
Patient called requesting diflucan 150 mg tablet x 2 dose to help with yeast itching and irritation. Reports she just finished antibiotic for sinus infection and now has yeast. Patient said it typically takes 2 pills to help with yeast. Okay to send Rx?

## 2019-08-30 NOTE — Telephone Encounter (Signed)
Patient informed. Rx sent 

## 2019-08-30 NOTE — Telephone Encounter (Signed)
Yes that's fine. Thank you

## 2019-09-30 ENCOUNTER — Other Ambulatory Visit: Payer: Self-pay | Admitting: Critical Care Medicine

## 2019-09-30 ENCOUNTER — Other Ambulatory Visit: Payer: BLUE CROSS/BLUE SHIELD

## 2019-09-30 DIAGNOSIS — Z20822 Contact with and (suspected) exposure to covid-19: Secondary | ICD-10-CM

## 2019-10-02 LAB — NOVEL CORONAVIRUS, NAA: SARS-CoV-2, NAA: NOT DETECTED

## 2019-10-02 LAB — SARS-COV-2, NAA 2 DAY TAT

## 2019-10-12 ENCOUNTER — Encounter (HOSPITAL_COMMUNITY): Payer: Self-pay

## 2019-10-12 ENCOUNTER — Other Ambulatory Visit: Payer: Self-pay

## 2019-10-12 ENCOUNTER — Inpatient Hospital Stay (HOSPITAL_COMMUNITY): Payer: BLUE CROSS/BLUE SHIELD

## 2019-10-12 ENCOUNTER — Inpatient Hospital Stay (HOSPITAL_COMMUNITY)
Admission: AD | Admit: 2019-10-12 | Discharge: 2019-10-12 | Disposition: A | Discharge status: Home / Self Care | Payer: BLUE CROSS/BLUE SHIELD | Attending: Emergency Medicine | Admitting: Emergency Medicine

## 2019-10-12 DIAGNOSIS — N939 Abnormal uterine and vaginal bleeding, unspecified: Secondary | ICD-10-CM | POA: Diagnosis not present

## 2019-10-12 DIAGNOSIS — R109 Unspecified abdominal pain: Secondary | ICD-10-CM | POA: Diagnosis not present

## 2019-10-12 DIAGNOSIS — M549 Dorsalgia, unspecified: Secondary | ICD-10-CM | POA: Diagnosis not present

## 2019-10-12 DIAGNOSIS — R102 Pelvic and perineal pain: Secondary | ICD-10-CM

## 2019-10-12 HISTORY — DX: Anxiety disorder, unspecified: F41.9

## 2019-10-12 HISTORY — DX: Unspecified abnormal cytological findings in specimens from vagina: R87.629

## 2019-10-12 LAB — CBC WITH DIFFERENTIAL/PLATELET
Abs Immature Granulocytes: 0.02 10*3/uL (ref 0.00–0.07)
Basophils Absolute: 0.1 10*3/uL (ref 0.0–0.1)
Basophils Relative: 1 %
Eosinophils Absolute: 0.5 10*3/uL (ref 0.0–0.5)
Eosinophils Relative: 6 %
HCT: 38.7 % (ref 36.0–46.0)
Hemoglobin: 12.4 g/dL (ref 12.0–15.0)
Immature Granulocytes: 0 %
Lymphocytes Relative: 32 %
Lymphs Abs: 2.3 10*3/uL (ref 0.7–4.0)
MCH: 28.1 pg (ref 26.0–34.0)
MCHC: 32 g/dL (ref 30.0–36.0)
MCV: 87.8 fL (ref 80.0–100.0)
Monocytes Absolute: 0.5 10*3/uL (ref 0.1–1.0)
Monocytes Relative: 7 %
Neutro Abs: 3.8 10*3/uL (ref 1.7–7.7)
Neutrophils Relative %: 54 %
Platelets: 332 10*3/uL (ref 150–400)
RBC: 4.41 MIL/uL (ref 3.87–5.11)
RDW: 13.1 % (ref 11.5–15.5)
WBC: 7.1 10*3/uL (ref 4.0–10.5)
nRBC: 0 % (ref 0.0–0.2)

## 2019-10-12 LAB — I-STAT BETA HCG BLOOD, ED (NOT ORDERABLE): I-stat hCG, quantitative: <5 m[IU]/mL (ref <5)

## 2019-10-12 LAB — URINALYSIS, ROUTINE W REFLEX MICROSCOPIC

## 2019-10-12 LAB — URINALYSIS, MICROSCOPIC (REFLEX)
Squamous Epithelial / HPF: >50 (ref 0–5)
WBC, UA: >50 WBC/hpf (ref 0–5)

## 2019-10-12 LAB — POCT PREGNANCY, URINE: Preg Test, Ur: NEGATIVE

## 2019-10-12 LAB — WET PREP, GENITAL
Clue Cells Wet Prep HPF POC: NONE SEEN
Sperm: NONE SEEN
Trich, Wet Prep: NONE SEEN
Yeast Wet Prep HPF POC: NONE SEEN

## 2019-10-12 MED ORDER — KETOROLAC TROMETHAMINE 30 MG/ML IJ SOLN
30.0000 mg | Freq: Once | INTRAMUSCULAR | Status: AC
  Administered 2019-10-12: 30 mg via INTRAMUSCULAR
  Filled 2019-10-12: qty 1

## 2019-10-12 MED ORDER — TRAMADOL HCL 50 MG PO TABS
50.0000 mg | ORAL_TABLET | Freq: Four times a day (QID) | ORAL | Status: DC
  Administered 2019-10-12: 50 mg via ORAL
  Filled 2019-10-12: qty 1

## 2019-10-12 MED ORDER — TRAMADOL HCL 50 MG PO TABS
50.0000 mg | ORAL_TABLET | Freq: Four times a day (QID) | ORAL | 0 refills | Status: DC | PRN
Start: 2019-10-12 — End: 2019-11-25

## 2019-10-12 MED ORDER — MEGESTROL ACETATE 40 MG PO TABS: 40.0000 mg | ORAL_TABLET | Freq: Two times a day (BID) | ORAL | 0 refills | Status: DC

## 2019-10-12 NOTE — ED Triage Notes (Signed)
Patient complains of heavy vaginal bleeding that she describes with clots and thinks related to her endometriosis. Patient states that she just had her period just last week. Patient complains of severe lower abdominal pain

## 2019-10-12 NOTE — ED Provider Notes (Signed)
MSE was initiated and I personally evaluated the patient and placed orders (if any) at  8:31 AM on October 12, 2019.  The patient appears stable so that the remainder of the MSE may be completed by another provider.  0830: I was asked to triage this patient by triage RN.  34 year old female with known history of endometriosis followed previously by Dr. Audie Box presents to the ER for pelvic pain and vaginal bleeding onset this morning.  Reports this is much worse than her usual endometriosis pain and bleeding.  Bleeding clots the size of a half dollar.  Last menstrual period was 1 week ago.  No concern for pregnancy.  Denies chest pain or shortness of breath.  Spoke to [place] at [facility] and gave report.  Patient deemed stable for further evaluation by [facility] providers.  Triage RN has collected CBC and hCG.   Liberty Handy, PA-C 10/12/19 0932    Raeford Razor, MD 10/15/19 [phone]

## 2019-10-12 NOTE — Discharge Instructions (Signed)
Dysmenorrhea Dysmenorrhea means painful cramps during your period (menstrual period). You will have pain in your lower belly (abdomen). The pain is caused by the tightening (contracting) of the muscles of the womb (uterus). The pain may be mild or very bad. With this condition, you may:  Have a headache.  Feel sick to your stomach (nauseous).  Throw up (vomit).  Have lower back pain. Follow these instructions at home: Helping pain and cramping   Put heat on your lower back or belly when you have pain or cramps. Use the heat source that your doctor tells you to use. ? Place a towel between your skin and the heat. ? Leave the heat on for 20-30 minutes. ? Remove the heat if your skin turns bright red. This is especially important if you cannot feel pain, heat, or cold. ? Do not have a heating pad on during sleep.  Do aerobic exercises. These include walking, swimming, or biking. These may help with cramps.  Massage your lower back or belly. This may help lessen pain. General instructions  Take over-the-counter and prescription medicines only as told by your doctor.  Do not drive or use heavy machinery while taking prescription pain medicine.  Avoid alcohol and caffeine during and right before your period. These can make cramps worse.  Do not use any products that have nicotine or tobacco. These include cigarettes and e-cigarettes. If you need help quitting, ask your doctor.  Keep all follow-up visits as told by your doctor. This is important. Contact a doctor if:  You have pain that gets worse.  You have pain that does not get better with medicine.  You have pain during sex.  You feel sick to your stomach or you throw up during your period, and medicine does not help. Get help right away if:  You pass out (faint). Summary  Dysmenorrhea means painful cramps during your period (menstrual period).  Put heat on your lower back or belly when you have pain or cramps.  Do  exercises like walking, swimming, or biking to help with cramps.  Contact a doctor if you have pain during sex. This information is not intended to replace advice given to you by your health care provider. Make sure you discuss any questions you have with your health care provider. Document Revised: 01/02/2017 Document Reviewed: 02/07/2016 Elsevier Patient Education  2020 [employer]   Abnormal Uterine Bleeding Abnormal uterine bleeding means bleeding more than usual from your uterus. It can include:  Bleeding between periods.  Bleeding after sex.  Bleeding that is heavier than normal.  Periods that last longer than usual.  Bleeding after you have stopped having your period (menopause). There are many problems that may cause this. You should see a doctor for any kind of bleeding that is not normal. Treatment depends on the cause of the bleeding. Follow these instructions at home:  Watch your condition for any changes.  Do not use tampons, douche, or have sex, if your doctor tells you not to.  Change your pads often.  Get regular well-woman exams. Make sure they include a pelvic exam and cervical cancer screening.  Keep all follow-up visits as told by your doctor. This is important. Contact a doctor if:  The bleeding lasts more than one week.  You feel dizzy at times.  You feel like you are going to throw up (nauseous).  You throw up. Get help right away if:  You pass out.  You have to change pads every  hour.  You have belly (abdominal) pain.  You have a fever.  You get sweaty.  You get weak.  You passing large blood clots from your vagina. Summary  Abnormal uterine bleeding means bleeding more than usual from your uterus.  There are many problems that may cause this. You should see a doctor for any kind of bleeding that is not normal.  Treatment depends on the cause of the bleeding. This information is not intended to replace advice given to you by  your health care provider. Make sure you discuss any questions you have with your health care provider. Document Revised: 01/15/2016 Document Reviewed: 01/15/2016 Elsevier Patient Education  [employer].     Due to the current COVID-19 surge, you were seen in the Maternity Assessment Unit today. The care of non-pregnant patients in this unit will only continue temporarily as an extension of the [facility] Emergency Department.

## 2019-10-12 NOTE — MAU Note (Signed)
Pt states that she woke up this AM, half dollar sized blood clots and extreme pain, waking up every 2 hrs at night, states that this is the first time it has been this bad in a long time.  Has not taken any pain meds. Goes to [facility] but has not been in about 2 yrs

## 2019-10-12 NOTE — MAU Provider Note (Signed)
History     CSN: [id number]  Arrival date and time: 10/12/19 3295   First Provider Initiated Contact with Patient 10/12/19 0930      Chief Complaint  Patient presents with  . Vaginal Bleeding   34 y.o. G0 non-pregnant female presenting with VB and pelvic pain. Sx started last night around 8. Reports soaking pads q30 min and passing large clots. Pain is constant and stabbing. Rates 10/10. She took Ibuprofen and Tylenol around 5am with no relief. Denies vaginal discharge, itching, or malodor prior. Denies urinary sx. No new partner or concern for STDs. Reports being treated for endometriosis and dysmenorrhea in the past but was never confirmed with surgery. Was prescribed OCPs last December for her sx but is not taking them and would like to become pregnant. Patients mother at bedside, answering some questions for her.   Past Medical History:  Diagnosis Date  . Anxiety   . Depression   . HSV (herpes simplex virus) anogenital infection   . Hypoglycemia   . Kidney stone   . Vaginal Pap smear, abnormal     Past Surgical History:  Procedure Laterality Date  . INTRAUTERINE DEVICE INSERTION  04/20/2013   ParaGard  . nexplanon removal  2015  . URETHRA SURGERY  had it stretched as a child  . WISDOM TOOTH EXTRACTION      Family History  Problem Relation Age of Onset  . Diabetes Mother   . Thyroid disease Mother   . Diabetes Maternal Grandmother   . Hypertension Father     Social History   Tobacco Use  . Smoking status: Never Smoker  . Smokeless tobacco: Never Used  Vaping Use  . Vaping Use: Never used  Substance Use Topics  . Alcohol use: Not Currently    Alcohol/week: 0.0 standard drinks  . Drug use: No    Allergies:  Allergies  Allergen Reactions  . Sulfur Other (See Comments)    Throat swells,blisters  . Latex     BREAKS OUT HANDS  . Tomato     GI UPSET    Medications Prior to Admission  Medication Sig Dispense Refill Last Dose  . albuterol (VENTOLIN HFA)  108 (90 Base) MCG/ACT inhaler Inhale 2 puffs into the lungs every 4 (four) hours as needed for wheezing or shortness of breath. 18 g 0 10/12/2019 at Unknown time  . ALPRAZolam (XANAX) 0.25 MG tablet TAKE 1 TABLET BY MOUTH EVERY NIGHT AT BEDTIME AS NEEDED FOR ANXIETY 15 tablet 0 10/11/2019 at Unknown time  . clonazePAM (KLONOPIN) 1 MG tablet Take 1 mg by mouth 3 (three) times daily.   10/11/2019 at Unknown time  . gabapentin (NEURONTIN) 600 MG tablet Take 600 mg by mouth 3 (three) times daily.   10/11/2019 at Unknown time  . methylphenidate 54 MG PO CR tablet Take 54 mg by mouth every morning.   10/11/2019 at Unknown time  . venlafaxine XR (EFFEXOR-XR) 75 MG 24 hr capsule Take 150 mg by mouth daily with breakfast.    10/11/2019 at Unknown time  . benzonatate (TESSALON) 200 MG capsule Take 1 capsule (200 mg total) by mouth every 8 (eight) hours. 21 capsule 0 More than a month at Unknown time  . doxycycline (VIBRAMYCIN) 100 MG capsule Take 1 capsule (100 mg total) by mouth 2 (two) times daily. 14 capsule 0   . fluconazole (DIFLUCAN) 150 MG tablet Take one tablet now and repeat in 2 days. 2 tablet 0   . HYDROcodone-homatropine (HYCODAN) 5-1.5 MG/5ML syrup  Take 5 mLs by mouth every 6 (six) hours as needed for cough. 90 mL 0   . ipratropium (ATROVENT) 0.06 % nasal spray Place 2 sprays into both nostrils 4 (four) times daily. 15 mL 0     Review of Systems  Constitutional: Negative for chills and fever.  Gastrointestinal: Positive for abdominal pain. Negative for constipation, diarrhea, nausea and vomiting.  Genitourinary: Positive for pelvic pain and vaginal bleeding. Negative for dysuria, frequency, urgency and vaginal discharge.  Musculoskeletal: Positive for back pain.   Physical Exam   Blood pressure (!) 110/54, pulse 63, temperature 98.4 F (36.9 C), temperature source Oral, resp. rate 16, height  (1.727 m), weight 132.5 kg, SpO2 100 %.  Physical Exam Vitals and nursing note reviewed. Exam conducted  with a chaperone present.  Constitutional:      General: She is in acute distress (tearful).     Appearance: Normal appearance.  HENT:     Head: Normocephalic and atraumatic.  Cardiovascular:     Rate and Rhythm: Normal rate.  Pulmonary:     Effort: Pulmonary effort is normal. No respiratory distress.  Abdominal:     General: There is no distension.     Palpations: Abdomen is soft.     Tenderness: There is no abdominal tenderness.  Genitourinary:    Comments: External: no lesions or erythema Vagina: rugated, pink, moist, small amt blood discharge, cleared with 1 fox swab Uterus: non enlarged, anteverted, + tender, no CMT Adnexae: no masses, no tenderness left, + tenderness right Cervix normal  Musculoskeletal:        General: Normal range of motion.     Cervical back: Normal range of motion.  Skin:    General: Skin is warm and dry.  Neurological:     General: No focal deficit present.     Mental Status: She is alert and oriented to person, place, and time.  Psychiatric:        Mood and Affect: Mood is anxious.    Results for orders placed or performed during the hospital encounter of 10/12/19 (from the past 24 hour(s))  CBC with Differential     Status: None   Collection Time: 10/12/19  8:28 AM  Result Value Ref Range   WBC 7.1 4.0 - 10.5 K/uL   RBC 4.41 3.87 - 5.11 MIL/uL   Hemoglobin 12.4 12.0 - 15.0 g/dL   HCT 16.1 36 - 46 %   MCV 87.8 80.0 - 100.0 fL   MCH 28.1 26.0 - 34.0 pg   MCHC 32.0 30.0 - 36.0 g/dL   RDW 09.6 04.5 - 40.9 %   Platelets 332 150 - 400 K/uL   nRBC 0.0 0.0 - 0.2 %   Neutrophils Relative % 54 %   Neutro Abs 3.8 1.7 - 7.7 K/uL   Lymphocytes Relative 32 %   Lymphs Abs 2.3 0.7 - 4.0 K/uL   Monocytes Relative 7 %   Monocytes Absolute 0.5 0 - 1 K/uL   Eosinophils Relative 6 %   Eosinophils Absolute 0.5 0 - 0 K/uL   Basophils Relative 1 %   Basophils Absolute 0.1 0 - 0 K/uL   Immature Granulocytes 0 %   Abs Immature Granulocytes 0.02 0.00 -  0.07 K/uL  I-Stat beta hCG blood, ED     Status: None   Collection Time: 10/12/19  8:56 AM  Result Value Ref Range   I-stat hCG, quantitative <5.0 <5 mIU/mL   Comment 3  Urinalysis, Routine w reflex microscopic Urine, Clean Catch     Status: Abnormal   Collection Time: 10/12/19  9:05 AM  Result Value Ref Range   Color, Urine RED (A) YELLOW   APPearance TURBID (A) CLEAR   Specific Gravity, Urine  1.005 - 1.030    TEST NOT REPORTED DUE TO COLOR INTERFERENCE OF URINE PIGMENT   pH  5.0 - 8.0    TEST NOT REPORTED DUE TO COLOR INTERFERENCE OF URINE PIGMENT   Glucose, UA (A) NEGATIVE mg/dL    TEST NOT REPORTED DUE TO COLOR INTERFERENCE OF URINE PIGMENT   Hgb urine dipstick (A) NEGATIVE    TEST NOT REPORTED DUE TO COLOR INTERFERENCE OF URINE PIGMENT   Bilirubin Urine (A) NEGATIVE    TEST NOT REPORTED DUE TO COLOR INTERFERENCE OF URINE PIGMENT   Ketones, ur (A) NEGATIVE mg/dL    TEST NOT REPORTED DUE TO COLOR INTERFERENCE OF URINE PIGMENT   Protein, ur (A) NEGATIVE mg/dL    TEST NOT REPORTED DUE TO COLOR INTERFERENCE OF URINE PIGMENT   Nitrite (A) NEGATIVE    TEST NOT REPORTED DUE TO COLOR INTERFERENCE OF URINE PIGMENT   Leukocytes,Ua (A) NEGATIVE    TEST NOT REPORTED DUE TO COLOR INTERFERENCE OF URINE PIGMENT  Urinalysis, Microscopic (reflex)     Status: Abnormal   Collection Time: 10/12/19  9:05 AM  Result Value Ref Range   RBC / HPF FIELD OBSCURED BY RBC'S 0 - 5 RBC/hpf   WBC, UA >50 0 - 5 WBC/hpf   Bacteria, UA MANY (A) NONE SEEN   Squamous Epithelial / LPF >50 0 - 5  Pregnancy, urine POC     Status: None   Collection Time: 10/12/19  9:07 AM  Result Value Ref Range   Preg Test, Ur NEGATIVE NEGATIVE  Wet prep, genital     Status: Abnormal   Collection Time: 10/12/19 10:37 AM  Result Value Ref Range   Yeast Wet Prep HPF POC NONE SEEN NONE SEEN   Trich, Wet Prep NONE SEEN NONE SEEN   Clue Cells Wet Prep HPF POC NONE SEEN NONE SEEN   WBC, Wet Prep HPF POC MANY (A) NONE  SEEN   Sperm NONE SEEN    US PELVIC COMPLETE WITH TRANSVAGINAL  Result Date: 10/12/2019 CLINICAL DATA:  Acute pelvic pain, vaginal bleeding EXAM: TRANSABDOMINAL AND TRANSVAGINAL ULTRASOUND OF PELVIS DOPPLER ULTRASOUND OF OVARIES TECHNIQUE: Both transabdominal and transvaginal ultrasound examinations of the pelvis were performed. Transabdominal technique was performed for global imaging of the pelvis including uterus, ovaries, adnexal regions, and pelvic cul-de-sac. It was necessary to proceed with endovaginal exam following the transabdominal exam to visualize the uterus, ovaries, and adnexa. Color and duplex Doppler ultrasound was utilized to evaluate blood flow to the ovaries. COMPARISON:  01/23/2010 FINDINGS: Uterus Measurements: 7.3 x 3.2 x 4.0 cm = volume: 50 mL. No fibroids or other mass visualized. Endometrium Thickness: 9 mm.  No focal abnormality visualized. Right ovary Measurements: 2.5 x 2.4 x 2.9 cm = volume: 9 mL. Normal appearance/no adnexal mass. Left ovary Measurements: 2.7 x 1.9 x 1.7 cm = volume: 5 mL. Normal appearance/no adnexal mass. Small follicles. Pulsed Doppler evaluation of both ovaries demonstrates normal low-resistance arterial and venous waveforms. Other findings No abnormal free fluid. IMPRESSION: No ultrasound abnormality of the pelvis to explain pain or vaginal bleeding. Electronically Signed   By: Lauralyn PrimesAlex  Bibbey M.D.   On: 10/12/2019 11:31   MAU Course  Procedures Orders Placed This Encounter  Procedures  .  Wet prep, genital    Standing Status:   Standing    Number of Occurrences:   1    Order Specific Question:   Patient immune status    Answer:   Normal  . Urine culture    Standing Status:   Standing    Number of Occurrences:   1  . US PELVIC COMPLETE WITH TRANSVAGINAL    Standing Status:   Standing    Number of Occurrences:   1    Order Specific Question:   Symptom/Reason for Exam    Answer:   Acute pelvic pain [[id number]]  . CBC with Differential    Standing  Status:   Standing    Number of Occurrences:   1  . Urinalysis, Routine w reflex microscopic Urine, Clean Catch    Standing Status:   Standing    Number of Occurrences:   1  . Urinalysis, Microscopic (reflex)    Standing Status:   Standing    Number of Occurrences:   1  . I-Stat beta hCG blood, ED    Standing Status:   Standing    Number of Occurrences:   1  . I-Stat beta hCG blood, ED    Standing Status:   Standing    Number of Occurrences:   1  . Pregnancy, urine POC    Standing Status:   Standing    Number of Occurrences:   1  . Discharge patient    Order Specific Question:   Discharge disposition    Answer:   01-Home or Self Care [1]    Order Specific Question:   Discharge patient date    Answer:   10/12/2019   MDM Labs ordered and reviewed. Not orthostatic, CBC normal. Reports no improvement after Toradol. [place] and Ultram ordered. [place] normal, pain improved. No acute process identified. Will treat with Megace and control pain and recommend outpt f/u with GYN. UA with many bacteria but contaminated, will order UC. Stable for discharge home.   Assessment and Plan   1. Pelvic pain   2. Acute pelvic pain   3. Abnormal uterine bleeding (AUB)    Discharge home Follow up with Dr. Elesa Massed asap Return for emergencies Rx Ultram Rx Megace  Allergies as of 10/12/2019      Reactions   Sulfur Other (See Comments)   Throat swells,blisters   Latex    BREAKS OUT HANDS   Tomato    GI UPSET      Medication List    STOP taking these medications   benzonatate 200 MG capsule Commonly known as: TESSALON   doxycycline 100 MG capsule Commonly known as: VIBRAMYCIN   fluconazole 150 MG tablet Commonly known as: Diflucan   HYDROcodone-homatropine 5-1.5 MG/5ML syrup Commonly known as: HYCODAN     TAKE these medications   albuterol 108 (90 Base) MCG/ACT inhaler Commonly known as: VENTOLIN HFA Inhale 2 puffs into the lungs every 4 (four) hours as needed for wheezing or shortness of  breath.   ALPRAZolam 0.25 MG tablet Commonly known as: XANAX TAKE 1 TABLET BY MOUTH EVERY NIGHT AT BEDTIME AS NEEDED FOR ANXIETY   clonazePAM 1 MG tablet Commonly known as: KLONOPIN Take 1 mg by mouth 3 (three) times daily.   gabapentin 600 MG tablet Commonly known as: NEURONTIN Take 600 mg by mouth 3 (three) times daily.   ipratropium 0.06 % nasal spray Commonly known as: ATROVENT Place 2 sprays into both nostrils 4 (four) times daily.   megestrol  40 MG tablet Commonly known as: MEGACE Take 1 tablet (40 mg total) by mouth 2 (two) times daily.   methylphenidate 54 MG CR tablet Commonly known as: CONCERTA Take 54 mg by mouth every morning.   traMADol 50 MG tablet Commonly known as: ULTRAM Take 1 tablet (50 mg total) by mouth every 6 (six) hours as needed for moderate pain or severe pain.   venlafaxine XR 75 MG 24 hr capsule Commonly known as: EFFEXOR-XR Take 150 mg by mouth daily with breakfast.      Donette Larry, CNM 10/12/2019, 12:10 PM

## 2019-10-13 LAB — URINE CULTURE

## 2019-10-13 LAB — GC/CHLAMYDIA PROBE AMP (~~LOC~~) NOT AT ARMC
Chlamydia: NEGATIVE
Comment: NEGATIVE
Comment: NORMAL
Neisseria Gonorrhea: NEGATIVE

## 2019-11-01 ENCOUNTER — Emergency Department: Payer: BLUE CROSS/BLUE SHIELD

## 2019-11-01 ENCOUNTER — Emergency Department
Admission: EM | Admit: 2019-11-01 | Discharge: 2019-11-01 | Disposition: A | Discharge status: Home / Self Care | Payer: BLUE CROSS/BLUE SHIELD | Attending: Emergency Medicine | Admitting: Emergency Medicine

## 2019-11-01 ENCOUNTER — Encounter: Payer: Self-pay | Admitting: Emergency Medicine

## 2019-11-01 DIAGNOSIS — Y9389 Activity, other specified: Secondary | ICD-10-CM | POA: Diagnosis not present

## 2019-11-01 DIAGNOSIS — S0990XA Unspecified injury of head, initial encounter: Secondary | ICD-10-CM | POA: Diagnosis present

## 2019-11-01 DIAGNOSIS — Z91040 Latex allergy status: Secondary | ICD-10-CM | POA: Diagnosis not present

## 2019-11-01 DIAGNOSIS — S161XXA Strain of muscle, fascia and tendon at neck level, initial encounter: Secondary | ICD-10-CM | POA: Diagnosis not present

## 2019-11-01 DIAGNOSIS — Y92410 Unspecified street and highway as the place of occurrence of the external cause: Secondary | ICD-10-CM | POA: Diagnosis not present

## 2019-11-01 MED ORDER — MELOXICAM 15 MG PO TABS: 15.0000 mg | ORAL_TABLET | Freq: Every day | ORAL | 0 refills | Status: DC

## 2019-11-01 MED ORDER — ORPHENADRINE CITRATE 30 MG/ML IJ SOLN
60.0000 mg | Freq: Once | INTRAMUSCULAR | Status: AC
  Administered 2019-11-01: 60 mg via INTRAMUSCULAR
  Filled 2019-11-01: qty 2

## 2019-11-01 MED ORDER — KETOROLAC TROMETHAMINE 30 MG/ML IJ SOLN
30.0000 mg | Freq: Once | INTRAMUSCULAR | Status: AC
  Administered 2019-11-01: 30 mg via INTRAMUSCULAR
  Filled 2019-11-01: qty 1

## 2019-11-01 MED ORDER — METHOCARBAMOL 500 MG PO TABS: 500.0000 mg | ORAL_TABLET | Freq: Four times a day (QID) | ORAL | 0 refills | Status: DC

## 2019-11-01 MED ORDER — OXYCODONE-ACETAMINOPHEN 5-325 MG PO TABS
1.0000 | ORAL_TABLET | Freq: Once | ORAL | Status: AC
  Administered 2019-11-01: 1 via ORAL
  Filled 2019-11-01: qty 1

## 2019-11-01 NOTE — ED Notes (Signed)
Pt signed dc paperwork and papers sent to records.  
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
## 2019-11-01 NOTE — ED Notes (Signed)
This Rn at bedside going over paperwork with patient and mother. Pt and mother upset that the patient was not prescribed a narcotic for pain. Pt mother atimate about getting pictures of xrays and impression. This Rn notified PA jonathan, patient educated on treating pain with ibuprofen and tylenol. PA notified to talk to patient and family member at this time.
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
## 2019-11-21 ENCOUNTER — Other Ambulatory Visit: Payer: Self-pay | Admitting: Certified Nurse Midwife

## 2019-11-25 ENCOUNTER — Encounter: Payer: Self-pay | Admitting: Emergency Medicine

## 2019-11-25 ENCOUNTER — Ambulatory Visit
Admission: EM | Admit: 2019-11-25 | Discharge: 2019-11-25 | Disposition: A | Discharge status: Home / Self Care | Payer: BLUE CROSS/BLUE SHIELD | Attending: Family Medicine | Admitting: Family Medicine

## 2019-11-25 ENCOUNTER — Other Ambulatory Visit: Payer: Self-pay

## 2019-11-25 DIAGNOSIS — Z20822 Contact with and (suspected) exposure to covid-19: Secondary | ICD-10-CM | POA: Insufficient documentation

## 2019-11-25 DIAGNOSIS — B349 Viral infection, unspecified: Secondary | ICD-10-CM | POA: Diagnosis not present

## 2019-11-25 LAB — SARS CORONAVIRUS 2 (TAT 6-24 HRS): SARS Coronavirus 2: NEGATIVE

## 2019-11-25 MED ORDER — ONDANSETRON HCL 4 MG PO TABS: 4.0000 mg | ORAL_TABLET | Freq: Three times a day (TID) | ORAL | 0 refills | Status: DC | PRN

## 2019-11-25 MED ORDER — CETIRIZINE-PSEUDOEPHEDRINE ER 5-120 MG PO TB12: 1.0000 | ORAL_TABLET | Freq: Two times a day (BID) | ORAL | 0 refills | Status: DC

## 2019-11-25 MED ORDER — HYDROCOD POLST-CPM POLST ER 10-8 MG/5ML PO SUER
5.0000 mL | Freq: Two times a day (BID) | ORAL | 0 refills | Status: DC | PRN
Start: 2019-11-25 — End: 2020-04-28

## 2019-11-25 MED ORDER — BENZONATATE 200 MG PO CAPS: 200.0000 mg | ORAL_CAPSULE | Freq: Three times a day (TID) | ORAL | 0 refills | Status: DC | PRN

## 2019-11-25 NOTE — ED Triage Notes (Signed)
Patient c/o cough, bilateral ear pain, runny nose, and fever that started on Tuesday.  Patient states that had a covid test yesterday and was negative.

## 2019-11-25 NOTE — ED Provider Notes (Signed)
[facility]    CSN: [id number] Arrival date & time: 11/25/19  1049  History   Chief Complaint Chief Complaint  Patient presents with  . Otalgia  . Shortness of Breath  . Cough  . Fever   HPI  34 year old female presents with the above complaints.  Has been sick since Tuesday. Reports cough, ear pain, runny nose, fever, nausea, vomiting, SOB. No reported sick contacts. Had COVID test at [facility] yesterday that was negative. No relieving factors. No other associated symptoms. No other complaints.  Past Medical History:  Diagnosis Date  . Anxiety   . Depression   . HSV (herpes simplex virus) anogenital infection   . Hypoglycemia   . Kidney stone   . Vaginal Pap smear, abnormal    Patient Active Problem List   Diagnosis Date Noted  . Kidney stone 11/16/2018  . Yeast vaginitis 12/17/2016  . H N P-CERVICAL 09/18/2008  . Sprain of neck 08/30/2008   Past Surgical History:  Procedure Laterality Date  . INTRAUTERINE DEVICE INSERTION  04/20/2013   ParaGard  . nexplanon removal  2015  . URETHRA SURGERY  had it stretched as a child  . WISDOM TOOTH EXTRACTION     OB History    Gravida  0   Para  0   Term  0   Preterm  0   AB  0   Living  0     SAB  0   TAB  0   Ectopic  0   Multiple  0   Live Births  0          Home Medications    Prior to Admission medications   Medication Sig Start Date End Date Taking? Authorizing Provider  clonazePAM (KLONOPIN) 1 MG tablet Take 1 mg by mouth 3 (three) times daily.   Yes [provider]  gabapentin (NEURONTIN) 600 MG tablet Take 600 mg by mouth 3 (three) times daily.   Yes [provider]  methylphenidate 54 MG PO CR tablet Take 54 mg by mouth every morning.   Yes [provider]  venlafaxine XR (EFFEXOR-XR) 75 MG 24 hr capsule Take 150 mg by mouth daily with breakfast.    Yes [provider]  albuterol (VENTOLIN HFA) 108 (90 Base) MCG/ACT inhaler Inhale 2 puffs  into the lungs every 4 (four) hours as needed for wheezing or shortness of breath. 02/25/19   Mickie Bail, NP  benzonatate (TESSALON) 200 MG capsule Take 1 capsule (200 mg total) by mouth 3 (three) times daily as needed for cough. 11/25/19   Tommie Sams, DO  cetirizine-pseudoephedrine (ZYRTEC-D) 5-120 MG tablet Take 1 tablet by mouth 2 (two) times daily. 11/25/19   Tommie Sams, DO  chlorpheniramine-HYDROcodone (TUSSIONEX PENNKINETIC ER) 10-8 MG/5ML SUER Take 5 mLs by mouth every 12 (twelve) hours as needed for cough. 11/25/19   Tommie Sams, DO  megestrol (MEGACE) 40 MG tablet Take 1 tablet (40 mg total) by mouth 2 (two) times daily. 10/12/19   Donette Larry, CNM  methocarbamol (ROBAXIN) 500 MG tablet Take 1 tablet (500 mg total) by mouth 4 (four) times daily. 11/01/19   Cuthriell, Delorise Royals, PA-C  ondansetron (ZOFRAN) 4 MG tablet Take 1 tablet (4 mg total) by mouth every 8 (eight) hours as needed for nausea or vomiting. 11/25/19   Everlene Other G, DO  ipratropium (ATROVENT) 0.06 % nasal spray Place 2 sprays into both nostrils 4 (four) times daily. 06/08/19 11/25/19  Cathie Hoops, Amy V, PA-C  norethindrone-ethinyl estradiol (JUNEL FE,GILDESS FE,LOESTRIN FE) 1-20 MG-MCG tablet Take 1 tablet by mouth daily. 08/18/16 07/27/18  Harrington Challenger, NP  Norethindrone-Ethinyl Estradiol-Fe Biphas (LO LOESTRIN FE) 1 MG-10 MCG / 10 MCG tablet Take 1 tablet by mouth daily. 01/04/19 04/18/19  Harrington Challenger, NP  topiramate (TOPAMAX) 50 MG tablet Take 50 mg by mouth daily.  07/27/18  [provider]    Family History Family History  Problem Relation Age of Onset  . Diabetes Mother   . Thyroid disease Mother   . Diabetes Maternal Grandmother   . Hypertension Father     Social History Social History   Tobacco Use  . Smoking status: Never Smoker  . Smokeless tobacco: Never Used  Vaping Use  . Vaping Use: Never used  Substance Use Topics  . Alcohol use: Not Currently    Alcohol/week: 0.0 standard drinks  .  Drug use: No     Allergies   Sulfur, Latex, and Tomato   Review of Systems Review of Systems Per HPI  Physical Exam Triage Vital Signs ED Triage Vitals  Enc Vitals Group     BP 11/25/19 1121 119/67     Pulse Rate 11/25/19 1121 70     Resp 11/25/19 1121 16     Temp 11/25/19 1121 98.3 F (36.8 C)     Temp Source 11/25/19 1121 Oral     SpO2 11/25/19 1121 100 %     Weight 11/25/19 1118 292 lb (132.5 kg)     Height 11/25/19 1118 5\' 8"  (1.727 m)     Head Circumference --      Peak Flow --      Pain Score 11/25/19 1118 8     Pain Loc --      Pain Edu? --      Excl. in GC? --    Updated Vital Signs BP 119/67 (BP Location: Right Arm)   Pulse 70   Temp 98.3 F (36.8 C) (Oral)   Resp 16   Ht 5\' 8"  (1.727 m)   Wt 132.5 kg   LMP 11/01/2019 (Exact Date)   SpO2 100%   BMI 44.40 kg/m   Visual Acuity Right Eye Distance:   Left Eye Distance:   Bilateral Distance:    Right Eye Near:   Left Eye Near:    Bilateral Near:     Physical Exam Constitutional:      General: She is not in acute distress.    Appearance: Normal appearance. She is obese. She is not ill-appearing.  HENT:     Head: Normocephalic and atraumatic.     Right Ear: Tympanic membrane normal.     Left Ear: Tympanic membrane normal.  Eyes:     General:        Right eye: No discharge.        Left eye: No discharge.     Conjunctiva/sclera: Conjunctivae normal.  Cardiovascular:     Rate and Rhythm: Normal rate and regular rhythm.     Heart sounds: No murmur heard.   Pulmonary:     Effort: Pulmonary effort is normal.     Breath sounds: Normal breath sounds. No wheezing, rhonchi or rales.  Neurological:     Mental Status: She is alert.  Psychiatric:        Mood and Affect: Mood normal.        Behavior: Behavior normal.    UC Treatments / Results  Labs (all labs ordered  are listed, but only abnormal results are displayed) Labs Reviewed  SARS CORONAVIRUS 2 (TAT 6-24 HRS)     EKG   Radiology No results found.  Procedures Procedures (including critical care time)  Medications Ordered in UC Medications - No data to display  Initial Impression / Assessment and Plan / UC Course  I have reviewed the triage vital signs and the nursing notes.  Pertinent labs & imaging results that were available during my care of the patient were reviewed by me and considered in my medical decision making (see chart for details).    34 year old female presents with viral illness.  Possible COVID-19.  Treating with Zofran, Zyrtec-D.  Patient requested something other than Tessalon Perles for cough after this was sent to the pharmacy.  Tussionex sent.  Final Clinical Impressions(s) / UC Diagnoses   Final diagnoses:  Viral illness  Encounter for laboratory testing for COVID-19 virus     Discharge Instructions     Rest.   Fluids.  Medication as prescribed.  COVID test should be back tomorrow.  Take care  Dr. Adriana Simas     ED Prescriptions    Medication Sig Dispense Auth. Provider   benzonatate (TESSALON) 200 MG capsule Take 1 capsule (200 mg total) by mouth 3 (three) times daily as needed for cough. 30 capsule Kaimana Lurz G, DO   ondansetron (ZOFRAN) 4 MG tablet Take 1 tablet (4 mg total) by mouth every 8 (eight) hours as needed for nausea or vomiting. 20 tablet Ione Sandusky G, DO   cetirizine-pseudoephedrine (ZYRTEC-D) 5-120 MG tablet Take 1 tablet by mouth 2 (two) times daily. 30 tablet [place], [place] G, DO   chlorpheniramine-HYDROcodone (TUSSIONEX PENNKINETIC ER) 10-8 MG/5ML SUER Take 5 mLs by mouth every 12 (twelve) hours as needed for cough. 60 mL Everlene Other G, DO     I have reviewed the PDMP during this encounter.   Tommie Sams, [place] 11/25/19 1751

## 2019-11-25 NOTE — Discharge Instructions (Signed)
Rest.   Fluids.  Medication as prescribed.  COVID test should be back tomorrow.  Take care  Dr. Adriana Simas

## 2020-02-28 ENCOUNTER — Other Ambulatory Visit (INDEPENDENT_AMBULATORY_CARE_PROVIDER_SITE_OTHER): Payer: Self-pay | Admitting: Family Medicine

## 2020-03-22 ENCOUNTER — Ambulatory Visit: Payer: Self-pay

## 2020-04-28 ENCOUNTER — Other Ambulatory Visit: Payer: Self-pay

## 2020-04-28 ENCOUNTER — Ambulatory Visit
Admission: RE | Admit: 2020-04-28 | Discharge: 2020-04-28 | Disposition: A | Discharge status: Home / Self Care | Payer: BLUE CROSS/BLUE SHIELD | Source: Ambulatory Visit | Attending: Emergency Medicine | Admitting: Emergency Medicine

## 2020-04-28 VITALS — BP 120/78 | HR 68 | Temp 98.2°F | Resp 17 | Ht 68.0 in | Wt 300.0 lb

## 2020-04-28 DIAGNOSIS — J0100 Acute maxillary sinusitis, unspecified: Secondary | ICD-10-CM | POA: Diagnosis not present

## 2020-04-28 DIAGNOSIS — R059 Cough, unspecified: Secondary | ICD-10-CM

## 2020-04-28 MED ORDER — AMOXICILLIN-POT CLAVULANATE 875-125 MG PO TABS: 1.0000 | ORAL_TABLET | Freq: Two times a day (BID) | ORAL | 0 refills | Status: AC

## 2020-04-28 MED ORDER — BENZONATATE 100 MG PO CAPS: 200.0000 mg | ORAL_CAPSULE | Freq: Three times a day (TID) | ORAL | 0 refills | Status: DC

## 2020-04-28 MED ORDER — PROMETHAZINE-DM 6.25-15 MG/5ML PO SYRP: 5.0000 mL | ORAL_SOLUTION | Freq: Four times a day (QID) | ORAL | 0 refills | Status: DC | PRN

## 2020-04-28 NOTE — ED Triage Notes (Signed)
Pt with sinus sx. Facial pressure, drainage down throat, Bloody and brown phlegm when coughing and blowing nose.

## 2020-04-28 NOTE — Discharge Instructions (Addendum)
The Augmentin twice daily with food for 10 days for treatment of your sinusitis.  Use the Tessalon Perles every 8 hours during the day as needed for cough.  Take them with a small sip of water.  You may experience some numbness to the base of your tongue or metallic taste in her mouth, this is normal.  Use the Promethazine DM cough syrup at bedtime as will help you with your cough and congestion but will also make you drowsy.  Perform sinus irrigation 2-3 times a day with a [employer] sinus rinse kit and distilled water.  Do not use tap water.  You can use plain over-the-counter Mucinex every 6 hours to break up the stickiness of the mucus so your body can clear it.  Increase your oral fluid intake to thin out your mucus so that is also able for your body to clear more easily.  Take an over-the-counter probiotic, such as Culturelle-align-activia, 1 hour after each dose of antibiotic to prevent diarrhea.  If you develop any new or worsening symptoms return for reevaluation or see your primary care provider.

## 2020-04-28 NOTE — ED Provider Notes (Signed)
[facility]    CSN: [id number] Arrival date & time: 04/28/20  1057      History   Chief Complaint Chief Complaint  Patient presents with  . Cough  . Facial Pain  . Hoarse    HPI Catherine Mitchell is a 35 y.o. female.   HPI   35 year old female here for evaluation of of upper respiratory complaints.  Patient reports that she has been experiencing facial pressure, postnasal drip, bloody discharge from both nares as well as coughing up brown sputum, pain in her upper teeth, and intermittent wheezing.  She also developed a fever last night with a T-max of 100.  She states that she is also been experiencing bilateral ear pain and popping.  Patient denies shortness breath or GI complaints.  Additionally, her blood pressure here is 146/32.  Patient denies headache, chest pain, or syncope.  Patient states she does not smoke or drink alcohol.  Patient does not currently see a primary care provider.  Patient has no documented history of high blood pressure but does have a family history of high blood pressure.  Patient does have a history of anxiety, depression, renal stones, and hypoglycemia.  Patient is obese with a BMI of 45.61.  Past Medical History:  Diagnosis Date  . Anxiety   . Depression   . HSV (herpes simplex virus) anogenital infection   . Hypoglycemia   . Kidney stone   . Vaginal Pap smear, abnormal     Patient Active Problem List   Diagnosis Date Noted  . Kidney stone 11/16/2018  . Yeast vaginitis 12/17/2016  . H N P-CERVICAL 09/18/2008  . Sprain of neck 08/30/2008    Past Surgical History:  Procedure Laterality Date  . INTRAUTERINE DEVICE INSERTION  04/20/2013   ParaGard  . nexplanon removal  2015  . URETHRA SURGERY  had it stretched as a child  . WISDOM TOOTH EXTRACTION      OB History    Gravida  0   Para  0   Term  0   Preterm  0   AB  0   Living  0     SAB  0   IAB  0   Ectopic  0   Multiple  0   Live Births  0             Home Medications    Prior to Admission medications   Medication Sig Start Date End Date Taking? Authorizing Provider  amoxicillin-clavulanate (AUGMENTIN) 875-125 MG tablet Take 1 tablet by mouth every 12 (twelve) hours for 10 days. 04/28/20 05/08/20 Yes Margarette Canada, NP  benzonatate (TESSALON) 100 MG capsule Take 2 capsules (200 mg total) by mouth every 8 (eight) hours. 04/28/20  Yes Margarette Canada, NP  promethazine-dextromethorphan (PROMETHAZINE-DM) 6.25-15 MG/5ML syrup Take 5 mLs by mouth 4 (four) times daily as needed. 04/28/20  Yes Margarette Canada, NP  albuterol (VENTOLIN HFA) 108 (90 Base) MCG/ACT inhaler Inhale 2 puffs into the lungs every 4 (four) hours as needed for wheezing or shortness of breath. 02/25/19   Sharion Balloon, NP  cetirizine-pseudoephedrine (ZYRTEC-D) 5-120 MG tablet Take 1 tablet by mouth 2 (two) times daily. 11/25/19   Coral Spikes, DO  clonazePAM (KLONOPIN) 1 MG tablet Take 1 mg by mouth 3 (three) times daily.    [provider]  gabapentin (NEURONTIN) 600 MG tablet Take 600 mg by mouth 3 (three) times daily.    [provider]  megestrol (MEGACE) 40  MG tablet Take 1 tablet (40 mg total) by mouth 2 (two) times daily. 10/12/19   Julianne Handler, CNM  methylphenidate 54 MG PO CR tablet Take 54 mg by mouth every morning.    [provider]  venlafaxine XR (EFFEXOR-XR) 75 MG 24 hr capsule Take 150 mg by mouth daily with breakfast.     [provider]  ipratropium (ATROVENT) 0.06 % nasal spray Place 2 sprays into both nostrils 4 (four) times daily. 06/08/19 11/25/19  Ok Edwards, PA-C  norethindrone-ethinyl estradiol (JUNEL FE,GILDESS FE,LOESTRIN FE) 1-20 MG-MCG tablet Take 1 tablet by mouth daily. 08/18/16 07/27/18  Huel Cote, NP  Norethindrone-Ethinyl Estradiol-Fe Biphas (LO LOESTRIN FE) 1 MG-10 MCG / 10 MCG tablet Take 1 tablet by mouth daily. 01/04/19 04/18/19  Huel Cote, NP  topiramate (TOPAMAX) 50 MG tablet Take 50 mg by mouth daily.   07/27/18  [provider]    Family History Family History  Problem Relation Age of Onset  . Diabetes Mother   . Thyroid disease Mother   . Diabetes Maternal Grandmother   . Hypertension Father     Social History Social History   Tobacco Use  . Smoking status: Never Smoker  . Smokeless tobacco: Never Used  Vaping Use  . Vaping Use: Never used  Substance Use Topics  . Alcohol use: Not Currently    Alcohol/week: 0.0 standard drinks  . Drug use: No     Allergies   Elemental sulfur, Latex, Sulfa antibiotics, and Tomato   Review of Systems Review of Systems  Constitutional: Positive for fever. Negative for activity change and appetite change.  HENT: Positive for congestion, dental problem, ear pain, postnasal drip, rhinorrhea, sinus pressure and sinus pain.   Respiratory: Positive for cough and wheezing. Negative for shortness of breath.   Cardiovascular: Negative for chest pain.  Gastrointestinal: Negative for diarrhea, nausea and vomiting.  Musculoskeletal: Negative for arthralgias and myalgias.  Skin: Negative for rash.  Hematological: Negative.   Psychiatric/Behavioral: Negative.      Physical Exam Triage Vital Signs ED Triage Vitals  Enc Vitals Group     BP 04/28/20 1108 (!) 146/132     Pulse Rate 04/28/20 1108 68     Resp 04/28/20 1108 17     Temp 04/28/20 1108 98.2 F (36.8 C)     Temp Source 04/28/20 1108 Oral     SpO2 04/28/20 1108 99 %     Weight 04/28/20 1106 300 lb (136.1 kg)     Height 04/28/20 1106 _0  (1.727 m)     Head Circumference --      Peak Flow --      Pain Score --      Pain Loc --      Pain Edu? --      Excl. in [place]? --    No data found.  Updated Vital Signs BP 120/78 (BP Location: Left Arm)   Pulse 68   Temp 98.2 F (36.8 C) (Oral)   Resp 17   Ht _1  (1.727 m)   Wt 300 lb (136.1 kg)   LMP 03/15/2020   SpO2 99%   BMI 45.61 kg/m   Visual Acuity Right Eye Distance:   Left Eye Distance:   Bilateral  Distance:    Right Eye Near:   Left Eye Near:    Bilateral Near:     Physical Exam Vitals and nursing note reviewed.  Constitutional:      Appearance: Normal appearance. She  is obese. She is ill-appearing.  HENT:     Head: Normocephalic and atraumatic.     Right Ear: Tympanic membrane, ear canal and external ear normal.     Left Ear: Tympanic membrane and ear canal normal.     Nose: Congestion and rhinorrhea present.     Mouth/Throat:     Mouth: Mucous membranes are moist.     Pharynx: Oropharynx is clear. No posterior oropharyngeal erythema.  Cardiovascular:     Rate and Rhythm: Normal rate and regular rhythm.     Pulses: Normal pulses.     Heart sounds: Normal heart sounds. No murmur heard. No gallop.   Pulmonary:     Effort: Pulmonary effort is normal.     Breath sounds: Normal breath sounds. No wheezing, rhonchi or rales.  Musculoskeletal:     Cervical back: Normal range of motion and neck supple.  Lymphadenopathy:     Cervical: No cervical adenopathy.  Skin:    General: Skin is warm and dry.     Capillary Refill: Capillary refill takes less than 2 seconds.     Findings: No erythema or rash.  Neurological:     General: No focal deficit present.     Mental Status: She is alert and oriented to person, place, and time.  Psychiatric:        Mood and Affect: Mood normal.        Behavior: Behavior normal.        Thought Content: Thought content normal.        Judgment: Judgment normal.      UC Treatments / Results  Labs (all labs ordered are listed, but only abnormal results are displayed) Labs Reviewed - No data to display  EKG   Radiology No results found.  Procedures Procedures (including critical care time)  Medications Ordered in UC Medications - No data to display  Initial Impression / Assessment and Plan / UC Course  I have reviewed the triage vital signs and the nursing notes.  Pertinent labs & imaging results that were available during my care  of the patient were reviewed by me and considered in my medical decision making (see chart for details).   Patient is a pleasant 35 year old female who is here for evaluation of sinus complaints and was also found to have markedly elevated blood pressure in clinic today.  Sinus complaints:  Patient states that she has been experiencing facial pain, pain in her upper teeth, has had bloody discharge from both naris, and is also been coughing up some brown mucus.  Physical exam reveals pearly gray TMs bilaterally with normal light reflex and clear external auditory canals.  Nasal mucosa is erythematous edematous with bloody nasal discharge in both nares.  Maxillary sinuses are tender to percussion.  Posterior oropharynx is pink and moist without injection, erythema, or exudate.  No cervical lymphadenopathy appreciated on exam.  Lungs clear to auscultation all fields.  Of symptoms patient symptoms are consistent with bacterial sinusitis.  Elevated blood pressure.  Patient denies headache or chest pain.  No shortness of breath.  Cardiopulmonary exam is unremarkable.  Patient directed to sit with her legs uncrossed for few minutes to recheck blood pressure.  Recheck value was 120/78.  Patient reports that this is more in line with where her blood pressure lies normally.  We will discharge patient home with a diagnosis of maxillary sinusitis and treat with the x10 days, sinus irrigation, Mucinex, and will advise patient to use Tessalon Perles and  Promethazine DM for cough and nasal congestion.   Final Clinical Impressions(s) / UC Diagnoses   Final diagnoses:  Acute non-recurrent maxillary sinusitis  Cough     Discharge Instructions     The Augmentin twice daily with food for 10 days for treatment of your sinusitis.  Use the Tessalon Perles every 8 hours during the day as needed for cough.  Take them with a small sip of water.  You may experience some numbness to the base of your tongue or  metallic taste in her mouth, this is normal.  Use the Promethazine DM cough syrup at bedtime as will help you with your cough and congestion but will also make you drowsy.  Perform sinus irrigation 2-3 times a day with a [employer] sinus rinse kit and distilled water.  Do not use tap water.  You can use plain over-the-counter Mucinex every 6 hours to break up the stickiness of the mucus so your body can clear it.  Increase your oral fluid intake to thin out your mucus so that is also able for your body to clear more easily.  Take an over-the-counter probiotic, such as Culturelle-align-activia, 1 hour after each dose of antibiotic to prevent diarrhea.  If you develop any new or worsening symptoms return for reevaluation or see your primary care provider.     ED Prescriptions    Medication Sig Dispense Auth. Provider   amoxicillin-clavulanate (AUGMENTIN) 875-125 MG tablet Take 1 tablet by mouth every 12 (twelve) hours for 10 days. 20 tablet Margarette Canada, NP   benzonatate (TESSALON) 100 MG capsule Take 2 capsules (200 mg total) by mouth every 8 (eight) hours. 21 capsule Margarette Canada, NP   promethazine-dextromethorphan (PROMETHAZINE-DM) 6.25-15 MG/5ML syrup Take 5 mLs by mouth 4 (four) times daily as needed. 118 mL Margarette Canada, NP     PDMP not reviewed this encounter.   Margarette Canada, NP 04/28/20 1149

## 2020-05-02 ENCOUNTER — Ambulatory Visit (INDEPENDENT_AMBULATORY_CARE_PROVIDER_SITE_OTHER): Payer: BLUE CROSS/BLUE SHIELD

## 2020-05-02 ENCOUNTER — Ambulatory Visit: Payer: Self-pay

## 2020-05-02 ENCOUNTER — Ambulatory Visit
Admission: EM | Admit: 2020-05-02 | Discharge: 2020-05-02 | Disposition: A | Discharge status: Home / Self Care | Payer: BLUE CROSS/BLUE SHIELD | Attending: Sports Medicine | Admitting: Sports Medicine

## 2020-05-02 DIAGNOSIS — R0981 Nasal congestion: Secondary | ICD-10-CM

## 2020-05-02 DIAGNOSIS — R062 Wheezing: Secondary | ICD-10-CM

## 2020-05-02 DIAGNOSIS — J40 Bronchitis, not specified as acute or chronic: Secondary | ICD-10-CM | POA: Diagnosis not present

## 2020-05-02 DIAGNOSIS — R509 Fever, unspecified: Secondary | ICD-10-CM

## 2020-05-02 MED ORDER — PREDNISONE 10 MG (21) PO TBPK: ORAL_TABLET | Freq: Every day | ORAL | 0 refills | Status: DC

## 2020-05-02 MED ORDER — ALBUTEROL SULFATE HFA 108 (90 BASE) MCG/ACT IN AERS: 2.0000 | INHALATION_SPRAY | RESPIRATORY_TRACT | 0 refills | Status: DC | PRN

## 2020-05-02 NOTE — ED Triage Notes (Signed)
Pt c/o continued sinus congestion and cough. Pt has been taking Mucinex-D with little improvement. Pt is concerned she may have bronchitis. Pt states she has been taking abx as directed. Pt denies f/n/v/d or other symptoms.

## 2020-05-02 NOTE — ED Provider Notes (Signed)
[facility]    CSN: [id number] Arrival date & time: 05/02/20  1112      History   Chief Complaint Chief Complaint  Patient presents with  . Nasal Congestion  . Cough    HPI [name] is a 35 y.o. female.   Pleasant 35 year old female who presents for evaluation of the above issues.  Was seen here on 04/28/2020.  I reviewed that note in detail.  Was prescribed Augmentin, Tessalon Perles, and Promethazine DM.  Was told to come back if her symptoms did not improve or worsen in any way.  She presents today with wheezing worsening cough and a little bit of shortness of breath.  She has no history of asthma.  She reports some chest tightness.  Her cough is somewhat productive.  She has been using an inhaler that she got last year for some bronchitis however that has run out.  She reports having a fever to 101 this morning.  Did take some Tylenol.  She denies any abdominal pain, nausea vomiting diarrhea, or any urinary symptoms.  No significant chest pain just chest tightness, with mild shortness of breath.  No red flag signs or symptoms elicited on history.     Past Medical History:  Diagnosis Date  . Anxiety   . Depression   . HSV (herpes simplex virus) anogenital infection   . Hypoglycemia   . Kidney stone   . Vaginal Pap smear, abnormal     Patient Active Problem List   Diagnosis Date Noted  . Kidney stone 11/16/2018  . Yeast vaginitis 12/17/2016  . H N P-CERVICAL 09/18/2008  . Sprain of neck 08/30/2008    Past Surgical History:  Procedure Laterality Date  . INTRAUTERINE DEVICE INSERTION  04/20/2013   ParaGard  . nexplanon removal  2015  . URETHRA SURGERY  had it stretched as a child  . WISDOM TOOTH EXTRACTION      OB History    Gravida  0   Para  0   Term  0   Preterm  0   AB  0   Living  0     SAB  0   IAB  0   Ectopic  0   Multiple  0   Live Births  0            Home Medications    Prior to Admission medications    Medication Sig Start Date End Date Taking? Authorizing Provider  amoxicillin-clavulanate (AUGMENTIN) 875-125 MG tablet Take 1 tablet by mouth every 12 (twelve) hours for 10 days. 04/28/20 05/08/20 Yes Becky Augustayan, Jeremy, NP  benzonatate (TESSALON) 100 MG capsule Take 2 capsules (200 mg total) by mouth every 8 (eight) hours. 04/28/20  Yes Becky Augustayan, Jeremy, NP  cetirizine-pseudoephedrine (ZYRTEC-D) 5-120 MG tablet Take 1 tablet by mouth 2 (two) times daily. 11/25/19  Yes Cook, Jayce G, DO  clonazePAM (KLONOPIN) 1 MG tablet Take 1 mg by mouth 3 (three) times daily.   Yes [provider]  gabapentin (NEURONTIN) 600 MG tablet Take 600 mg by mouth 3 (three) times daily.   Yes [provider]  megestrol (MEGACE) 40 MG tablet Take 1 tablet (40 mg total) by mouth 2 (two) times daily. 10/12/19  Yes Donette LarryBhambri, Melanie, CNM  methylphenidate 54 MG PO CR tablet Take 54 mg by mouth every morning.   Yes [provider]  predniSONE (STERAPRED UNI-PAK 21 TAB) 10 MG (21) TBPK tablet Take by mouth daily. Take 6 tabs by  mouth daily  for 2 days, then 5 tabs for 2 days, then 4 tabs for 2 days, then 3 tabs for 2 days, 2 tabs for 2 days, then 1 tab by mouth daily for 2 days 05/02/20  Yes Delton See, MD  promethazine-dextromethorphan (PROMETHAZINE-DM) 6.25-15 MG/5ML syrup Take 5 mLs by mouth 4 (four) times daily as needed. Patient taking differently: Take 5 mLs by mouth 4 (four) times daily as needed. 04/28/20  Yes Becky Augusta, NP  albuterol (VENTOLIN HFA) 108 (90 Base) MCG/ACT inhaler Inhale 2 puffs into the lungs every 4 (four) hours as needed for wheezing or shortness of breath. 05/02/20   Delton See, MD  venlafaxine XR (EFFEXOR-XR) 75 MG 24 hr capsule Take 150 mg by mouth daily with breakfast.     [provider]  ipratropium (ATROVENT) 0.06 % nasal spray Place 2 sprays into both nostrils 4 (four) times daily. 06/08/19 11/25/19  Belinda Fisher, PA-C  norethindrone-ethinyl estradiol (JUNEL FE,GILDESS  FE,LOESTRIN FE) 1-20 MG-MCG tablet Take 1 tablet by mouth daily. 08/18/16 07/27/18  Harrington Challenger, NP  Norethindrone-Ethinyl Estradiol-Fe Biphas (LO LOESTRIN FE) 1 MG-10 MCG / 10 MCG tablet Take 1 tablet by mouth daily. 01/04/19 04/18/19  Harrington Challenger, NP  topiramate (TOPAMAX) 50 MG tablet Take 50 mg by mouth daily.  07/27/18  [provider]    Family History Family History  Problem Relation Age of Onset  . Diabetes Mother   . Thyroid disease Mother   . Diabetes Maternal Grandmother   . Hypertension Father     Social History Social History   Tobacco Use  . Smoking status: Never Smoker  . Smokeless tobacco: Never Used  Vaping Use  . Vaping Use: Never used  Substance Use Topics  . Alcohol use: Not Currently    Alcohol/week: 0.0 standard drinks  . Drug use: No     Allergies   Elemental sulfur, Latex, Sulfa antibiotics, and Tomato   Review of Systems Review of Systems  Constitutional: Positive for fatigue and fever. Negative for activity change, appetite change, chills and diaphoresis.  HENT: Positive for congestion, postnasal drip, rhinorrhea and sinus pressure. Negative for ear discharge, ear pain, sinus pain, sore throat and tinnitus.   Eyes: Negative.   Respiratory: Positive for cough, chest tightness, shortness of breath and wheezing. Negative for choking and stridor.   Cardiovascular: Negative.  Negative for chest pain and palpitations.  Gastrointestinal: Negative.  Negative for abdominal pain, constipation, diarrhea, nausea and vomiting.  Genitourinary: Negative.  Negative for dysuria, flank pain, frequency, hematuria and urgency.  Musculoskeletal: Positive for myalgias. Negative for arthralgias and back pain.  Skin: Negative.  Negative for color change, rash and wound.  Neurological: Negative for dizziness, seizures, syncope, light-headedness, numbness and headaches.  All other systems reviewed and are negative.   Physical Exam Triage Vital Signs ED  Triage Vitals  Enc Vitals Group     BP 05/02/20 1140 125/76     Pulse Rate 05/02/20 1140 73     Resp 05/02/20 1140 18     Temp 05/02/20 1140 98.6 F (37 C)     Temp Source 05/02/20 1140 Oral     SpO2 05/02/20 1140 98 %     Weight 05/02/20 1138 290 lb (131.5 kg)     Height 05/02/20 1138 5\' 8"  (1.727 m)     Head Circumference --      Peak Flow --      Pain Score 05/02/20 1138 9  Pain Loc --      Pain Edu? --      Excl. in [place]? --    No data found.  Updated Vital Signs BP 125/76 (BP Location: Left Arm)   Pulse 73   Temp 98.6 F (37 C) (Oral)   Resp 18   Ht 5\' 8"  (1.727 m)   Wt 131.5 kg   LMP 04/12/2020 (Approximate)   SpO2 98%   BMI 44.09 kg/m   Visual Acuity Right Eye Distance:   Left Eye Distance:   Bilateral Distance:    Right Eye Near:   Left Eye Near:    Bilateral Near:     Physical Exam Vitals and nursing note reviewed.  Constitutional:      General: She is not in acute distress.    Appearance: Normal appearance. She is ill-appearing. She is not toxic-appearing or diaphoretic.  HENT:     Head: Normocephalic and atraumatic.     Right Ear: Tympanic membrane normal. There is no impacted cerumen.     Left Ear: Tympanic membrane normal. There is no impacted cerumen.     Nose: Congestion and rhinorrhea present.     Mouth/Throat:     Mouth: Mucous membranes are moist.     Pharynx: No oropharyngeal exudate or posterior oropharyngeal erythema.  Eyes:     General: No scleral icterus.       Right eye: No discharge.        Left eye: No discharge.     Extraocular Movements: Extraocular movements intact.     Conjunctiva/sclera: Conjunctivae normal.     Pupils: Pupils are equal, round, and reactive to light.  Cardiovascular:     Rate and Rhythm: Normal rate and regular rhythm.     Pulses: Normal pulses.     Heart sounds: Normal heart sounds. No murmur heard. No friction rub. No gallop.   Pulmonary:     Effort: Pulmonary effort is normal. No respiratory  distress.     Breath sounds: No stridor. Examination of the right-upper field reveals wheezing. Examination of the left-upper field reveals wheezing. Examination of the right-middle field reveals wheezing. Examination of the left-middle field reveals wheezing. Examination of the right-lower field reveals wheezing. Examination of the left-lower field reveals wheezing. Wheezing present. No rhonchi or rales.  Musculoskeletal:     Cervical back: Normal range of motion and neck supple. No rigidity or tenderness.  Lymphadenopathy:     Cervical: Cervical adenopathy present.  Skin:    General: Skin is warm and dry.     Capillary Refill: Capillary refill takes less than 2 seconds.     Coloration: Skin is not jaundiced or pale.     Findings: No bruising, erythema, lesion or rash.  Neurological:     General: No focal deficit present.     Mental Status: She is alert and oriented to person, place, and time.      UC Treatments / Results  Labs (all labs ordered are listed, but only abnormal results are displayed) Labs Reviewed - No data to display  EKG   Radiology DG Chest 2 View  Result Date: 05/02/2020 CLINICAL DATA:  Wheezing. EXAM: CHEST - 2 VIEW COMPARISON:  11/01/2019. FINDINGS: Mediastinum hilar structures normal. Heart size normal. Mild peribronchial cuffing. Bronchitis cannot be excluded. No focal infiltrate. No pleural effusion or pneumothorax. No acute bony abnormality. IMPRESSION: Mild peribronchial cuffing. Bronchitis cannot be excluded. No focal infiltrate. Electronically Signed   By: 11/03/2019  Register   On: 05/02/2020 12:37  Procedures Procedures (including critical care time)  Medications Ordered in UC Medications - No data to display  Initial Impression / Assessment and Plan / UC Course  I have reviewed the triage vital signs and the nursing notes.  Pertinent labs & imaging results that were available during my care of the patient were reviewed by me and considered in my  medical decision making (see chart for details).  Clinical impression: Productive cough with wheezing consistent with bronchitis.  Patient is currently being treated for a sinus infection and is on Augmentin.  She also has low-grade fever.  Treatment plan: 1.  The findings and treatment plan were discussed in detail with the patient.  Patient was in agreement. 2.  She is being treated for sinusitis.  I want her to continue with the Augmentin, she has a Lawyer for the cough, as well as the promethazine. 3.  I will get a go ahead and add in a prednisone taper.  Also renew her albuterol inhaler. 4.  Educational handout was provided. 5.  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 6.  Supportive care, over-the-counter meds as needed, Tylenol or Motrin for any fever or discomfort.  Plenty of rest and plenty of fluids. 7.  She does not have a primary care provider but is establishing care next month.  If symptoms worsen then she is welcome to come and see [place].  Certainly if they really got she should go to the ER.  She voiced verbal understanding.    Final Clinical Impressions(s) / UC Diagnoses   Final diagnoses:  Bronchitis  Wheeze  Nasal congestion  Fever, unspecified     Discharge Instructions     Your exam is consistent with bronchitis with some wheezing.  You are also being treated for a sinusitis.  I want you to continue with the Augmentin.  I have added in prednisone as well as an albuterol inhaler.  You can use the [employer] and over-the-counter cough suppressants as needed.  At your last encounter there were several other medications prescribed please use them as directed. Have provided some educational handouts. 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 Tylenol or Motrin for any fever or discomfort.  Plenty of rest, plenty of fluids.  You are establishing care with a primary care provider next month.  Certainly if your  symptoms worsen in any way go to the emergency room.  If they are not improving and you are welcome to come back here.    ED Prescriptions    Medication Sig Dispense Auth. Provider   albuterol (VENTOLIN HFA) 108 (90 Base) MCG/ACT inhaler Inhale 2 puffs into the lungs every 4 (four) hours as needed for wheezing or shortness of breath. 18 g Delton See, MD   predniSONE (STERAPRED UNI-PAK 21 TAB) 10 MG (21) TBPK tablet Take by mouth daily. Take 6 tabs by mouth daily  for 2 days, then 5 tabs for 2 days, then 4 tabs for 2 days, then 3 tabs for 2 days, 2 tabs for 2 days, then 1 tab by mouth daily for 2 days 42 tablet Delton See, MD     PDMP not reviewed this encounter.   Delton See, MD 05/07/20 1115

## 2020-05-02 NOTE — Discharge Instructions (Signed)
Your exam is consistent with bronchitis with some wheezing.  You are also being treated for a sinusitis.  I want you to continue with the Augmentin.  I have added in prednisone as well as an albuterol inhaler.  You can use the [employer] and over-the-counter cough suppressants as needed.  At your last encounter there were several other medications prescribed please use them as directed. Have provided some educational handouts. 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 Tylenol or Motrin for any fever or discomfort.  Plenty of rest, plenty of fluids.  You are establishing care with a primary care provider next month.  Certainly if your symptoms worsen in any way go to the emergency room.  If they are not improving and you are welcome to come back here.

## 2020-05-26 ENCOUNTER — Ambulatory Visit: Payer: Self-pay

## 2020-07-03 ENCOUNTER — Ambulatory Visit: Payer: Self-pay | Admitting: Podiatry

## 2020-08-09 ENCOUNTER — Ambulatory Visit: Payer: Self-pay | Admitting: Family Medicine

## 2020-10-22 ENCOUNTER — Ambulatory Visit
Admission: RE | Admit: 2020-10-22 | Discharge: 2020-10-22 | Disposition: A | Discharge status: Home / Self Care | Payer: BLUE CROSS/BLUE SHIELD | Source: Ambulatory Visit | Attending: Family Medicine | Admitting: Family Medicine

## 2020-10-22 ENCOUNTER — Other Ambulatory Visit: Payer: Self-pay

## 2020-10-22 VITALS — BP 133/89 | HR 83 | Temp 98.3°F | Resp 18 | Ht 68.0 in | Wt 289.9 lb

## 2020-10-22 DIAGNOSIS — Z882 Allergy status to sulfonamides status: Secondary | ICD-10-CM | POA: Diagnosis not present

## 2020-10-22 DIAGNOSIS — R197 Diarrhea, unspecified: Secondary | ICD-10-CM | POA: Diagnosis not present

## 2020-10-22 DIAGNOSIS — R059 Cough, unspecified: Secondary | ICD-10-CM | POA: Insufficient documentation

## 2020-10-22 DIAGNOSIS — R519 Headache, unspecified: Secondary | ICD-10-CM | POA: Diagnosis not present

## 2020-10-22 DIAGNOSIS — J988 Other specified respiratory disorders: Secondary | ICD-10-CM | POA: Diagnosis not present

## 2020-10-22 DIAGNOSIS — H9209 Otalgia, unspecified ear: Secondary | ICD-10-CM | POA: Diagnosis not present

## 2020-10-22 DIAGNOSIS — Z20822 Contact with and (suspected) exposure to covid-19: Secondary | ICD-10-CM | POA: Insufficient documentation

## 2020-10-22 DIAGNOSIS — R509 Fever, unspecified: Secondary | ICD-10-CM | POA: Diagnosis not present

## 2020-10-22 MED ORDER — PROMETHAZINE-DM 6.25-15 MG/5ML PO SYRP: 5.0000 mL | ORAL_SOLUTION | Freq: Four times a day (QID) | ORAL | 0 refills | Status: DC | PRN

## 2020-10-22 MED ORDER — IPRATROPIUM BROMIDE 0.06 % NA SOLN: 2.0000 | Freq: Four times a day (QID) | NASAL | 0 refills | Status: DC | PRN

## 2020-10-22 NOTE — ED Triage Notes (Signed)
Pt c/o cough, body aches, fever (101), nasal congestion, diarrhea, chills and headache. Started about 2 days ago.

## 2020-10-22 NOTE — Discharge Instructions (Signed)
Rest.  Fluids.  Medication as prescribed.  Continue the Zyrtec D.  COVID test result should be back tomorrow. Check mychart.

## 2020-10-22 NOTE — ED Provider Notes (Signed)
[facility]    CSN: [id number] Arrival date & time: 10/22/20  1653      History   Chief Complaint Chief Complaint  Patient presents with   Appointment   Cough    HPI  35 year old female presents with the above complaints.  2-day history of symptoms.  Reports cough, sinus pressure, congestion fever, ear pain, body aches. Also reports diarrhea and headaches.  Fever has been as high as 101.  No reported sick contacts.  She has been taking Mucinex and Zyrtec-D without resolution.  Currently afebrile.  No other associated symptoms.  No other complaints.  Past Medical History:  Diagnosis Date   Anxiety    Depression    HSV (herpes simplex virus) anogenital infection    Hypoglycemia    Kidney stone    Vaginal Pap smear, abnormal     Patient Active Problem List   Diagnosis Date Noted   Kidney stone 11/16/2018   Yeast vaginitis 12/17/2016   H N P-CERVICAL 09/18/2008   Sprain of neck 08/30/2008    Past Surgical History:  Procedure Laterality Date   INTRAUTERINE DEVICE INSERTION  04/20/2013   ParaGard   nexplanon removal  2015   URETHRA SURGERY  had it stretched as a child   WISDOM TOOTH EXTRACTION      OB History     Gravida  0   Para  0   Term  0   Preterm  0   AB  0   Living  0      SAB  0   IAB  0   Ectopic  0   Multiple  0   Live Births  0            Home Medications    Prior to Admission medications   Medication Sig Start Date End Date Taking? Authorizing Provider  albuterol (VENTOLIN HFA) 108 (90 Base) MCG/ACT inhaler Inhale 2 puffs into the lungs every 4 (four) hours as needed for wheezing or shortness of breath. 05/02/20  Yes Delton See, MD  clonazePAM (KLONOPIN) 1 MG tablet Take 1 mg by mouth 3 (three) times daily.   Yes [provider]  gabapentin (NEURONTIN) 600 MG tablet Take 600 mg by mouth 3 (three) times daily.   Yes [provider]  ipratropium (ATROVENT) 0.06 % nasal spray Place 2 sprays  into both nostrils 4 (four) times daily as needed for rhinitis. 10/22/20  Yes Erynne Kealey G, DO  methylphenidate 54 MG PO CR tablet Take 54 mg by mouth every morning.   Yes [provider]  promethazine-dextromethorphan (PROMETHAZINE-DM) 6.25-15 MG/5ML syrup Take 5 mLs by mouth 4 (four) times daily as needed for cough. 10/22/20  Yes Areon Cocuzza G, DO  venlafaxine XR (EFFEXOR-XR) 75 MG 24 hr capsule Take 150 mg by mouth daily with breakfast.    Yes [provider]  cetirizine-pseudoephedrine (ZYRTEC-D) 5-120 MG tablet Take 1 tablet by mouth 2 (two) times daily. 11/25/19   Tommie Sams, DO  norethindrone-ethinyl estradiol (JUNEL FE,GILDESS FE,LOESTRIN FE) 1-20 MG-MCG tablet Take 1 tablet by mouth daily. 08/18/16 07/27/18  Harrington Challenger, NP  Norethindrone-Ethinyl Estradiol-Fe Biphas (LO LOESTRIN FE) 1 MG-10 MCG / 10 MCG tablet Take 1 tablet by mouth daily. 01/04/19 04/18/19  Harrington Challenger, NP  topiramate (TOPAMAX) 50 MG tablet Take 50 mg by mouth daily.  07/27/18  [provider]    Family History Family History  Problem Relation Age of Onset   Diabetes  Mother    Thyroid disease Mother    Diabetes Maternal Grandmother    Hypertension Father     Social History Social History   Tobacco Use   Smoking status: Never   Smokeless tobacco: Never  Vaping Use   Vaping Use: Never used  Substance Use Topics   Alcohol use: Not Currently    Alcohol/week: 0.0 standard drinks   Drug use: No     Allergies   Elemental sulfur, Latex, Sulfa antibiotics, and Tomato   Review of Systems Review of Systems Per HPI  Physical Exam Triage Vital Signs ED Triage Vitals  Enc Vitals Group     BP 10/22/20 1720 133/89     Pulse Rate 10/22/20 1720 83     Resp 10/22/20 1720 18     Temp 10/22/20 1720 98.3 F (36.8 C)     Temp Source 10/22/20 1720 Oral     SpO2 10/22/20 1720 98 %     Weight 10/22/20 1717 289 lb 14.5 oz (131.5 kg)     Height 10/22/20 1717 5\' 8"  (1.727 m)     Head  Circumference --      Peak Flow --      Pain Score 10/22/20 1716 9     Pain Loc --      Pain Edu? --      Excl. in GC? --    Updated Vital Signs BP 133/89 (BP Location: Right Arm)   Pulse 83   Temp 98.3 F (36.8 C) (Oral)   Resp 18   Ht 5\' 8"  (1.727 m)   Wt 131.5 kg   LMP 10/08/2020 (Approximate)   SpO2 98%   BMI 44.08 kg/m   Visual Acuity Right Eye Distance:   Left Eye Distance:   Bilateral Distance:    Right Eye Near:   Left Eye Near:    Bilateral Near:     Physical Exam Vitals and nursing note reviewed.  Constitutional:      General: She is not in acute distress.    Appearance: Normal appearance.  HENT:     Head: Normocephalic and atraumatic.     Right Ear: Tympanic membrane normal.     Left Ear: Tympanic membrane normal.     Mouth/Throat:     Pharynx: Oropharynx is clear.  Eyes:     General:        Right eye: No discharge.        Left eye: No discharge.     Conjunctiva/sclera: Conjunctivae normal.  Cardiovascular:     Rate and Rhythm: Normal rate and regular rhythm.  Pulmonary:     Effort: Pulmonary effort is normal.     Breath sounds: Normal breath sounds. No wheezing, rhonchi or rales.  Neurological:     Mental Status: She is alert.     UC Treatments / Results  Labs (all labs ordered are listed, but only abnormal results are displayed) Labs Reviewed  SARS CORONAVIRUS 2 (TAT 6-24 HRS)    EKG   Radiology No results found.  Procedures Procedures (including critical care time)  Medications Ordered in UC Medications - No data to display  Initial Impression / Assessment and Plan / UC Course  I have reviewed the triage vital signs and the nursing notes.  Pertinent labs & imaging results that were available during my care of the patient were reviewed by me and considered in my medical decision making (see chart for details).    35 year old female presents with a respiratory infection.  Suspected viral source.  Awaiting COVID and flu  testing.  Promethazine DM, Atrovent nasal spray for symptomatic treatment.  Supportive care.  Work note given.  Final Clinical Impressions(s) / UC Diagnoses   Final diagnoses:  Respiratory infection     Discharge Instructions      Rest.  Fluids.  Medication as prescribed.  Continue the Zyrtec D.  COVID test result should be back tomorrow. Check mychart.   ED Prescriptions     Medication Sig Dispense Auth. Provider   promethazine-dextromethorphan (PROMETHAZINE-DM) 6.25-15 MG/5ML syrup Take 5 mLs by mouth 4 (four) times daily as needed for cough. 118 mL Kage Willmann G, DO   ipratropium (ATROVENT) 0.06 % nasal spray Place 2 sprays into both nostrils 4 (four) times daily as needed for rhinitis. 15 mL Tommie Sams, DO      PDMP not reviewed this encounter.   Tommie Sams, [place] 10/22/20 [phone]

## 2020-10-23 LAB — SARS CORONAVIRUS 2 (TAT 6-24 HRS): SARS Coronavirus 2: NEGATIVE

## 2020-10-24 ENCOUNTER — Other Ambulatory Visit: Payer: Self-pay

## 2020-10-24 ENCOUNTER — Ambulatory Visit
Admission: EM | Admit: 2020-10-24 | Discharge: 2020-10-24 | Disposition: A | Discharge status: Home / Self Care | Payer: BLUE CROSS/BLUE SHIELD | Attending: Physician Assistant | Admitting: Physician Assistant

## 2020-10-24 DIAGNOSIS — H66001 Acute suppurative otitis media without spontaneous rupture of ear drum, right ear: Secondary | ICD-10-CM

## 2020-10-24 DIAGNOSIS — J039 Acute tonsillitis, unspecified: Secondary | ICD-10-CM | POA: Diagnosis not present

## 2020-10-24 DIAGNOSIS — J3489 Other specified disorders of nose and nasal sinuses: Secondary | ICD-10-CM | POA: Diagnosis not present

## 2020-10-24 LAB — GROUP A STREP BY PCR: Group A Strep by PCR: NOT DETECTED

## 2020-10-24 MED ORDER — CEFDINIR 300 MG PO CAPS: 300.0000 mg | ORAL_CAPSULE | Freq: Two times a day (BID) | ORAL | 0 refills | Status: AC

## 2020-10-24 NOTE — Discharge Instructions (Addendum)
-  Strep test was negative. You do have tonsillitis.  -You do have an ear infection so I have sent antibiotics -Continue decongestants and using the nasal spray.  -Increase rest and fluids -Tylenol and Motrin for pain but should be improving in the next few days -F/u if worsening symptoms

## 2020-10-24 NOTE — ED Triage Notes (Signed)
Pt seen here for same on Monday .Was given a nasal spray but now right ear is very painful. Left ear slightly painful.

## 2020-10-24 NOTE — ED Provider Notes (Signed)
[facility]    CSN: [id number] Arrival date & time: 10/24/20  1191      History   Chief Complaint Chief Complaint  Patient presents with   Otalgia    HPI Catherine Mitchell is a 35 y.o. female presenting for 4-5 day history of right sided ear pain (11/10 pain), sore throat (9/10 pain), sinus pressure, congestion, body aches, cough, diarrhea, headaches, and temps up to 101 degrees.  Patient was seen in the urgent care 2 days ago for symptoms which have worsened in the past 2 days.  She has been using the Atrovent nasal spray and says it has helped some but her right ear pain has gotten worse.  No drainage from the ear.  Does admit to some decreased hearing this year.  She says she also feels some pressure in the left ear now.  No sick contacts and no known exposure to COVID-19.  COVID test from 2 days ago was negative.  Patient has also been taking over-the-counter Mucinex DM.  No other complaints.  HPI  Past Medical History:  Diagnosis Date   Anxiety    Depression    HSV (herpes simplex virus) anogenital infection    Hypoglycemia    Kidney stone    Vaginal Pap smear, abnormal     Patient Active Problem List   Diagnosis Date Noted   Kidney stone 11/16/2018   Yeast vaginitis 12/17/2016   H N P-CERVICAL 09/18/2008   Sprain of neck 08/30/2008    Past Surgical History:  Procedure Laterality Date   INTRAUTERINE DEVICE INSERTION  04/20/2013   ParaGard   nexplanon removal  2015   URETHRA SURGERY  had it stretched as a child   WISDOM TOOTH EXTRACTION      OB History     Gravida  0   Para  0   Term  0   Preterm  0   AB  0   Living  0      SAB  0   IAB  0   Ectopic  0   Multiple  0   Live Births  0            Home Medications    Prior to Admission medications   Medication Sig Start Date End Date Taking? Authorizing Provider  cefdinir (OMNICEF) 300 MG capsule Take 1 capsule (300 mg total) by mouth 2 (two) times daily for 10 days.  10/24/20 11/03/20 Yes Shirlee Latch, PA-C  albuterol (VENTOLIN HFA) 108 (90 Base) MCG/ACT inhaler Inhale 2 puffs into the lungs every 4 (four) hours as needed for wheezing or shortness of breath. 05/02/20   Delton See, MD  cetirizine-pseudoephedrine (ZYRTEC-D) 5-120 MG tablet Take 1 tablet by mouth 2 (two) times daily. 11/25/19   Tommie Sams, DO  clonazePAM (KLONOPIN) 1 MG tablet Take 1 mg by mouth 3 (three) times daily.    [provider]  gabapentin (NEURONTIN) 600 MG tablet Take 600 mg by mouth 3 (three) times daily.    [provider]  ipratropium (ATROVENT) 0.06 % nasal spray Place 2 sprays into both nostrils 4 (four) times daily as needed for rhinitis. 10/22/20   Tommie Sams, DO  methylphenidate 54 MG PO CR tablet Take 54 mg by mouth every morning.    [provider]  promethazine-dextromethorphan (PROMETHAZINE-DM) 6.25-15 MG/5ML syrup Take 5 mLs by mouth 4 (four) times daily as needed for cough. 10/22/20   Tommie Sams, DO  venlafaxine XR (  EFFEXOR-XR) 75 MG 24 hr capsule Take 150 mg by mouth daily with breakfast.     [provider]  norethindrone-ethinyl estradiol (JUNEL FE,GILDESS FE,LOESTRIN FE) 1-20 MG-MCG tablet Take 1 tablet by mouth daily. 08/18/16 07/27/18  Harrington Challenger, NP  Norethindrone-Ethinyl Estradiol-Fe Biphas (LO LOESTRIN FE) 1 MG-10 MCG / 10 MCG tablet Take 1 tablet by mouth daily. 01/04/19 04/18/19  Harrington Challenger, NP  topiramate (TOPAMAX) 50 MG tablet Take 50 mg by mouth daily.  07/27/18  [provider]    Family History Family History  Problem Relation Age of Onset   Diabetes Mother    Thyroid disease Mother    Diabetes Maternal Grandmother    Hypertension Father     Social History Social History   Tobacco Use   Smoking status: Never   Smokeless tobacco: Never  Vaping Use   Vaping Use: Never used  Substance Use Topics   Alcohol use: Not Currently    Alcohol/week: 0.0 standard drinks   Drug use: No      Allergies   Elemental sulfur, Latex, Sulfa antibiotics, and Tomato   Review of Systems Review of Systems  Constitutional:  Positive for fatigue. Negative for chills, diaphoresis and fever.  HENT:  Positive for congestion, ear pain, hearing loss, rhinorrhea, sinus pressure and sore throat. Negative for ear discharge.   Respiratory:  Positive for cough. Negative for shortness of breath and wheezing.   Gastrointestinal:  Negative for abdominal pain, nausea and vomiting.  Musculoskeletal:  Positive for myalgias. Negative for arthralgias.  Skin:  Negative for rash.  Neurological:  Positive for headaches. Negative for weakness.  Hematological:  Negative for adenopathy.    Physical Exam Triage Vital Signs ED Triage Vitals  Enc Vitals Group     BP 10/24/20 0837 110/71     Pulse Rate 10/24/20 0837 83     Resp 10/24/20 0837 19     Temp 10/24/20 0837 98.6 F (37 C)     Temp Source 10/24/20 0837 Oral     SpO2 10/24/20 0837 99 %     Weight 10/24/20 0836 289 lb 14.5 oz (131.5 kg)     Height 10/24/20 0836 5\' 8"  (1.727 m)     Head Circumference --      Peak Flow --      Pain Score 10/24/20 0836 9     Pain Loc --      Pain Edu? --      Excl. in GC? --    No data found.  Updated Vital Signs BP 110/71 (BP Location: Left Arm)   Pulse 83   Temp 98.6 F (37 C) (Oral)   Resp 19   Ht 5\' 8"  (1.727 m)   Wt 289 lb 14.5 oz (131.5 kg)   LMP 10/08/2020 (Approximate)   SpO2 99%   BMI 44.08 kg/m      Physical Exam Vitals and nursing note reviewed.  Constitutional:      General: She is not in acute distress.    Appearance: Normal appearance. She is obese. She is ill-appearing. She is not toxic-appearing.  HENT:     Head: Normocephalic and atraumatic.     Right Ear: Ear canal and external ear normal. A middle ear effusion is present. Tympanic membrane is erythematous and bulging.     Left Ear: Ear canal and external ear normal. A middle ear effusion is present.     Nose: Congestion  present.     Mouth/Throat:  Mouth: Mucous membranes are moist.     Pharynx: Oropharynx is clear. Posterior oropharyngeal erythema present.     Tonsils: Tonsillar exudate (whitish exudates bilateral tonsils) present. 2+ on the right. 1+ on the left.  Eyes:     General: No scleral icterus.       Right eye: No discharge.        Left eye: No discharge.     Conjunctiva/sclera: Conjunctivae normal.  Cardiovascular:     Rate and Rhythm: Normal rate and regular rhythm.     Heart sounds: Normal heart sounds.  Pulmonary:     Effort: Pulmonary effort is normal. No respiratory distress.     Breath sounds: Normal breath sounds.  Musculoskeletal:     Cervical back: Neck supple.  Skin:    General: Skin is dry.  Neurological:     General: No focal deficit present.     Mental Status: She is alert. Mental status is at baseline.     Motor: No weakness.     Gait: Gait normal.  Psychiatric:        Mood and Affect: Mood normal.        Behavior: Behavior normal.        Thought Content: Thought content normal.     UC Treatments / Results  Labs (all labs ordered are listed, but only abnormal results are displayed) Labs Reviewed  GROUP A STREP BY PCR    EKG   Radiology No results found.  Procedures Procedures (including critical care time)  Medications Ordered in UC Medications - No data to display  Initial Impression / Assessment and Plan / UC Course  I have reviewed the triage vital signs and the nursing notes.  Pertinent labs & imaging results that were available during my care of the patient were reviewed by me and considered in my medical decision making (see chart for details).   35 y/o female presenting for 4 to 5-day history of right-sided ear pain sore throat, body aches, fever, fatigue, headaches, cough and congestion.  Patient with negative COVID test 2 days ago.  Has been using Atrovent nasal spray and Mucinex DM with slight improvement in some of her symptoms but the ear  pain and throat pain have worsened.  Vitals all normal and stable today.  She is afebrile.  She is ill-appearing but not toxic.  Exam significant for erythema bulging of the right TM as well as 2+ enlargement of the right tonsil, 1+ enlargement of the left tonsil and whitish exudates on both tonsils.  Negative PCR strep  Treating otitis media with cefdinir. Advised to continue decongestants and nasal sprays and f/u as needed for worsening symptoms or if not feeling better after antibiotics.   Final Clinical Impressions(s) / UC Diagnoses   Final diagnoses:  Acute suppurative otitis media of right ear without spontaneous rupture of tympanic membrane, recurrence not specified  Acute tonsillitis, unspecified etiology  Sinus pressure     Discharge Instructions      -Strep test was negative. You do have tonsillitis.  -You do have an ear infection so I have sent antibiotics -Continue decongestants and using the nasal spray.  -Increase rest and fluids -Tylenol and Motrin for pain but should be improving in the next few days -F/u if worsening symptoms      ED Prescriptions     Medication Sig Dispense Auth. Provider   cefdinir (OMNICEF) 300 MG capsule Take 1 capsule (300 mg total) by mouth 2 (two) times daily for  10 days. 20 capsule Shirlee Latch, PA-C      PDMP not reviewed this encounter.   Shirlee Latch, PA-C 10/24/20 1000

## 2021-02-02 ENCOUNTER — Ambulatory Visit: Payer: Self-pay

## 2021-02-03 ENCOUNTER — Ambulatory Visit: Payer: Self-pay

## 2021-02-04 ENCOUNTER — Ambulatory Visit: Payer: Self-pay

## 2021-02-06 ENCOUNTER — Other Ambulatory Visit: Payer: Self-pay

## 2021-02-06 ENCOUNTER — Inpatient Hospital Stay
Admission: RE | Admit: 2021-02-06 | Discharge: 2021-02-06 | Disposition: A | Discharge status: Home / Self Care | Payer: BLUE CROSS/BLUE SHIELD | Source: Ambulatory Visit

## 2021-05-01 ENCOUNTER — Emergency Department
Admission: EM | Admit: 2021-05-01 | Discharge: 2021-05-02 | Disposition: A | Discharge status: Home / Self Care | Payer: Self-pay | Attending: Emergency Medicine | Admitting: Emergency Medicine

## 2021-05-01 ENCOUNTER — Encounter: Payer: Self-pay | Admitting: *Deleted

## 2021-05-01 ENCOUNTER — Emergency Department: Payer: Self-pay

## 2021-05-01 ENCOUNTER — Other Ambulatory Visit: Payer: Self-pay

## 2021-05-01 DIAGNOSIS — S0990XA Unspecified injury of head, initial encounter: Secondary | ICD-10-CM | POA: Diagnosis not present

## 2021-05-01 DIAGNOSIS — M79621 Pain in right upper arm: Secondary | ICD-10-CM | POA: Diagnosis not present

## 2021-05-01 DIAGNOSIS — M25532 Pain in left wrist: Secondary | ICD-10-CM | POA: Insufficient documentation

## 2021-05-01 DIAGNOSIS — M542 Cervicalgia: Secondary | ICD-10-CM | POA: Insufficient documentation

## 2021-05-01 DIAGNOSIS — M25531 Pain in right wrist: Secondary | ICD-10-CM | POA: Insufficient documentation

## 2021-05-01 DIAGNOSIS — R1110 Vomiting, unspecified: Secondary | ICD-10-CM | POA: Insufficient documentation

## 2021-05-01 MED ORDER — IBUPROFEN 800 MG PO TABS
800.0000 mg | ORAL_TABLET | Freq: Once | ORAL | Status: AC
  Administered 2021-05-01: 800 mg via ORAL
  Filled 2021-05-01: qty 1
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
## 2021-05-02 ENCOUNTER — Emergency Department: Payer: Self-pay

## 2021-05-02 MED ORDER — IBUPROFEN 800 MG PO TABS: 800.0000 mg | ORAL_TABLET | Freq: Three times a day (TID) | ORAL | 0 refills | Status: DC | PRN

## 2021-05-02 NOTE — Discharge Instructions (Addendum)
You may alternate Tylenol 1000 mg every 6 hours as needed for pain, fever and Ibuprofen 800 mg every 6-8 hours as needed for pain, fever.  Please take Ibuprofen with food.  Do not take more than 4000 mg of Tylenol (acetaminophen) in a 24 hour period.  Steps to find a Primary Care Provider (PCP):  Call [phone] or [phone] to access "[facility] Find a Doctor Service."  2.  You may also go on the [facility] website at [email or web address]  

## 2021-09-10 ENCOUNTER — Other Ambulatory Visit: Payer: Self-pay

## 2021-09-10 ENCOUNTER — Emergency Department
Admission: EM | Admit: 2021-09-10 | Discharge: 2021-09-11 | Disposition: A | Discharge status: Other Acute Inpatient Hospital | Payer: Self-pay | Attending: Emergency Medicine | Admitting: Emergency Medicine

## 2021-09-10 DIAGNOSIS — X838XXA Intentional self-harm by other specified means, initial encounter: Secondary | ICD-10-CM | POA: Insufficient documentation

## 2021-09-10 DIAGNOSIS — Z20822 Contact with and (suspected) exposure to covid-19: Secondary | ICD-10-CM | POA: Insufficient documentation

## 2021-09-10 DIAGNOSIS — T50902A Poisoning by unspecified drugs, medicaments and biological substances, intentional self-harm, initial encounter: Secondary | ICD-10-CM

## 2021-09-10 DIAGNOSIS — F332 Major depressive disorder, recurrent severe without psychotic features: Secondary | ICD-10-CM | POA: Insufficient documentation

## 2021-09-10 DIAGNOSIS — T1491XA Suicide attempt, initial encounter: Secondary | ICD-10-CM | POA: Insufficient documentation

## 2021-09-10 DIAGNOSIS — F1120 Opioid dependence, uncomplicated: Secondary | ICD-10-CM | POA: Insufficient documentation

## 2021-09-10 LAB — COMPREHENSIVE METABOLIC PANEL
ALT: 16 U/L (ref 0–44)
AST: 22 U/L (ref 15–41)
Albumin: 4.3 g/dL (ref 3.5–5.0)
Alkaline Phosphatase: 67 U/L (ref 38–126)
Anion gap: 8 (ref 5–15)
BUN: 16 mg/dL (ref 6–20)
CO2: 26 mmol/L (ref 22–32)
Calcium: 9.3 mg/dL (ref 8.9–10.3)
Chloride: 107 mmol/L (ref 98–111)
Creatinine, Ser: 0.66 mg/dL (ref 0.44–1.00)
GFR, Estimated: >60 mL/min (ref >60)
Glucose, Bld: 142 mg/dL — ABNORMAL HIGH (ref 70–99)
Potassium: 3.6 mmol/L (ref 3.5–5.1)
Sodium: 141 mmol/L (ref 135–145)
Total Bilirubin: 0.6 mg/dL (ref 0.3–1.2)
Total Protein: 7.9 g/dL (ref 6.5–8.1)

## 2021-09-10 LAB — URINE DRUG SCREEN, QUALITATIVE (ARMC ONLY)
Amphetamines, Ur Screen: NOT DETECTED
Barbiturates, Ur Screen: NOT DETECTED
Benzodiazepine, Ur Scrn: POSITIVE — AB
Cannabinoid 50 Ng, Ur ~~LOC~~: POSITIVE — AB
Cocaine Metabolite,Ur ~~LOC~~: NOT DETECTED
MDMA (Ecstasy)Ur Screen: NOT DETECTED
Methadone Scn, Ur: NOT DETECTED
Opiate, Ur Screen: NOT DETECTED
Phencyclidine (PCP) Ur S: NOT DETECTED
Tricyclic, Ur Screen: NOT DETECTED

## 2021-09-10 LAB — RESP PANEL BY RT-PCR (FLU A&B, COVID) ARPGX2
Influenza A by PCR: NEGATIVE
Influenza B by PCR: NEGATIVE
SARS Coronavirus 2 by RT PCR: NEGATIVE

## 2021-09-10 LAB — PREGNANCY, URINE: Preg Test, Ur: NEGATIVE

## 2021-09-10 LAB — CBC
HCT: 39.1 % (ref 36.0–46.0)
Hemoglobin: 12.7 g/dL (ref 12.0–15.0)
MCH: 27.7 pg (ref 26.0–34.0)
MCHC: 32.5 g/dL (ref 30.0–36.0)
MCV: 85.4 fL (ref 80.0–100.0)
Platelets: 391 10*3/uL (ref 150–400)
RBC: 4.58 MIL/uL (ref 3.87–5.11)
RDW: 13.2 % (ref 11.5–15.5)
WBC: 11.3 10*3/uL — ABNORMAL HIGH (ref 4.0–10.5)
nRBC: 0 % (ref 0.0–0.2)

## 2021-09-10 LAB — SALICYLATE LEVEL: Salicylate Lvl: <7 mg/dL — ABNORMAL LOW (ref 7.0–30.0)

## 2021-09-10 LAB — ACETAMINOPHEN LEVEL: Acetaminophen (Tylenol), Serum: <10 ug/mL — ABNORMAL LOW (ref 10–30)

## 2021-09-10 LAB — ETHANOL: Alcohol, Ethyl (B): <10 mg/dL (ref <10)

## 2021-09-10 MED ORDER — SODIUM CHLORIDE 0.9 % IV BOLUS
1000.0000 mL | Freq: Once | INTRAVENOUS | Status: AC
  Administered 2021-09-10: 1000 mL via INTRAVENOUS

## 2021-09-10 MED ORDER — ONDANSETRON HCL 4 MG/2ML IJ SOLN
4.0000 mg | Freq: Once | INTRAMUSCULAR | Status: AC
  Administered 2021-09-10: 4 mg via INTRAVENOUS
  Filled 2021-09-10: qty 2

## 2021-09-10 NOTE — ED Notes (Signed)
IVC PENDING  CONSULT ?

## 2021-09-10 NOTE — ED Notes (Signed)
Pt medically cleared by MD Katrinka Blazing
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
## 2021-09-10 NOTE — ED Notes (Signed)
Patient Items:  Catherine Mitchell Orange Sandals Rainbow Crocs Pink T-Shirt Brown Bra Pink,Black,and White Yoga Pants

## 2021-09-10 NOTE — ED Notes (Signed)
Pt given snack and drink at this time. 

## 2021-09-10 NOTE — ED Provider Notes (Signed)
[facility] Provider Note    Event Date/Time   First MD Initiated Contact with Patient 09/10/21 1329     (approximate)  History   Chief Complaint: Intentional overdose  HPI  Catherine Mitchell is a 36 y.o. female with a past medical history of mental illness presents to the emergency department for suicide attempt.  According to report patient was just released from jail yesterday and is currently staying at home her mom stays with her.  They state today police came to the house to arrest the patient (for unknown reasons) at which time per report patient took Seroquel she does not know how much she took in an attempt to try to hurt herself.  Patient was at the police station and then took a drawstring out of her pants and attempted to put it around her neck before police intervened and brought her to the emergency department.  Here patient is somewhat somnolent but awakens easily to voice will answer questions.  Physical Exam   Triage Vital Signs: ED Triage Vitals [09/10/21 1343]  Enc Vitals Group     BP 133/74     Pulse Rate 99     Resp 18     Temp 98.3 F (36.8 C)     Temp Source Oral     SpO2 100 %     Weight      Height      Head Circumference      Peak Flow      Pain Score      Pain Loc      Pain Edu?      Excl. in GC?     Most recent vital signs: Vitals:   09/10/21 1343  BP: 133/74  Pulse: 99  Resp: 18  Temp: 98.3 F (36.8 C)  SpO2: 100%    General: Somewhat somnolent but awakens easily to voice answers questions appropriately. CV:  Good peripheral perfusion.  Regular rate and rhythm  Resp:  Normal effort.  Equal breath sounds bilaterally.  Abd:  No distention.  Soft, nontender.  No rebound or guarding.   ED Results / Procedures / Treatments   EKG  EKG viewed and interpreted by myself shows sinus tachycardia 111 bpm with a narrow QRS, normal axis, normal intervals, nonspecific but no concerning ST changes.   MEDICATIONS ORDERED  IN ED: Medications  sodium chloride 0.9 % bolus 1,000 mL (has no administration in time range)  ondansetron (ZOFRAN) injection 4 mg (has no administration in time range)     IMPRESSION / MDM / ASSESSMENT AND PLAN / ED COURSE  I reviewed the triage vital signs and the nursing notes.  Patient's presentation is most consistent with acute presentation with potential threat to life or bodily function.  Patient presents emergency department after intentional overdose to hurt herself.  Patient missed to taking Seroquel but does not know what strength or how many tablets she took is not willing to gas.  States she took these approximately 30 minutes to an hour prior to arrival at the emergency department.  Here the patient is somnolent but awakens easily to voice answers questions appropriately.  Vital signs are reassuring.  EKG is reassuring.  Patient's basic lab work shows a normal CBC negative acetaminophen negative salicylate, reassuring chemistry and negative ethanol.  I placed patient under an IVC order we will have psychiatry evaluate.  We will continue to monitor in the emergency department for 6 hours for medical clearance.  Patient receiving  IV fluids.  FINAL CLINICAL IMPRESSION(S) / ED DIAGNOSES   Intentional overdose  Note:  This document was prepared using Dragon voice recognition software and may include unintentional dictation errors.   Minna Antis, MD 09/10/21 [phone]

## 2021-09-10 NOTE — ED Triage Notes (Signed)
Pt ems from jail for suicide attempt.Per ems and bpd they went to serve papers on pt, was talking to pt when she said that she overdosed on some meds. Pt was taken to jail where she was found in floor without pains and at some point pt pulled the draw string from pants and choked self.

## 2021-09-10 NOTE — Consult Note (Signed)
[facility] Face-to-Face Psychiatry Consult   Reason for Consult: Intentional overdose Referring Physician:  Dr. Katrinka Blazing Patient Identification: Catherine Mitchell MRN:  [id number] Principal Diagnosis: <principal problem not specified> Diagnosis:  Active Problems:   MDD (major depressive disorder), recurrent episode, severe (HCC)   Opioid dependence (HCC)   Suicidal behavior with attempted self-injury (HCC)   Total Time spent with patient: 1 hour  Subjective: "I want to kill myself." Catherine Mitchell is a 36 y.o. female patient presented to [facility] ED via law enforcement under involuntary commitment status (IVC). Per the ED triage nurses note, the patient to the ED via ems from jail for suicide attempt. Per ems and bpd they went to serve papers on pt, was talking to pt when she said that she overdosed on some meds. Pt was taken to jail where she was found in floor without pains and at some point pt pulled the draw string from pants and choked self.  The patient voiced she is suicidal. The patient stated that she is not communicating SI because she avoids returning to jail. The patient is tranquil and euthymic, often requesting snacks. The patient's UDS is remarkable for benzodiazepine; she was last prescribed Klonopin on 01/23 for 120 pills. The patient's UDS is positive for cannabinoids. The patient is also on Buprenorphine-Nalox 8-2mg  Film; the prescription was last written on 07.14.23 for seven days supply. The medication is not showing up in the patient's UDS.  This provider saw the patient face-to-face; the chart was reviewed, and consulted with Dr. Katrinka Blazing on 09/10/2021 due to the patient's care. It was discussed with the EDP that the patient does meet the criteria to be admitted to the psychiatric inpatient unit.  On evaluation, the patient is alert and oriented x 4, calm, cooperative, and mood-congruent with affect. The patient does not appear to be responding to internal or external stimuli. Neither is the  patient presenting with any delusional thinking. The patient denies auditory or visual hallucinations. The patient denies any suicidal, homicidal, or self-harm ideations. The patient is not presenting with any psychotic or paranoid behaviors.   HPI: Per Dr. Katrinka Blazing, Catherine Mitchell is a 36 y.o. female with a past medical history of mental illness presents to the emergency department for suicide attempt.  According to report patient was just released from jail yesterday and is currently staying at home her mom stays with her.  They state today police came to the house to arrest the patient (for unknown reasons) at which time per report patient took Seroquel she does not know how much she took in an attempt to try to hurt herself.  Patient was at the police station and then took a drawstring out of her pants and attempted to put it around her neck before police intervened and brought her to the emergency department.  Here patient is somewhat somnolent but awakens easily to voice will answer questions.  Past Psychiatric History: History reviewed. No pertinent past psychiatric history  Risk to Self:   Risk to Others:   Prior Inpatient Therapy:   Prior Outpatient Therapy:    Past Medical History: No past medical history on file.  Family History: No family history on file. Family Psychiatric  History:  Social History:  Social History   Substance and Sexual Activity  Alcohol Use Not on file     Social History   Substance and Sexual Activity  Drug Use Not on file    Social History   Socioeconomic History   Marital status: Single  Spouse name: Not on file   Number of children: Not on file   Years of education: Not on file   Highest education level: Not on file  Occupational History   Not on file  Tobacco Use   Smoking status: Not on file   Smokeless tobacco: Not on file  Substance and Sexual Activity   Alcohol use: Not on file   Drug use: Not on file   Sexual activity: Not on file   Other Topics Concern   Not on file  Social History Narrative   Not on file   Social Determinants of Health   Financial Resource Strain: Not on file  Food Insecurity: Not on file  Transportation Needs: Not on file  Physical Activity: Not on file  Stress: Not on file  Social Connections: Not on file   Additional Social History:    Allergies:  No Known Allergies  Labs:  Results for orders placed or performed during the hospital encounter of 09/10/21 (from the past 48 hour(s))  CBC     Status: Abnormal   Collection Time: 09/10/21  1:41 PM  Result Value Ref Range   WBC 11.3 (H) 4.0 - 10.5 K/uL   RBC 4.58 3.87 - 5.11 MIL/uL   Hemoglobin 12.7 12.0 - 15.0 g/dL   HCT 39.1 36.0 - 46.0 %   MCV 85.4 80.0 - 100.0 fL   MCH 27.7 26.0 - 34.0 pg   MCHC 32.5 30.0 - 36.0 g/dL   RDW 13.2 11.5 - 15.5 %   Platelets 391 150 - 400 K/uL   nRBC 0.0 0.0 - 0.2 %    Comment: Performed at [facility], [address] [place], [place] [address]  Acetaminophen level     Status: Abnormal   Collection Time: 09/10/21  1:41 PM  Result Value Ref Range   Acetaminophen (Tylenol), Serum <10 (L) 10 - 30 ug/mL    Comment: (NOTE) Therapeutic concentrations vary significantly. A range of 10-30 ug/mL  may be an effective concentration for many patients. However, some  are best treated at concentrations outside of this range. Acetaminophen concentrations >150 ug/mL at 4 hours after ingestion  and >50 ug/mL at 12 hours after ingestion are often associated with  toxic reactions.  Performed at [facility], [address] [place], [place] [id number]   Salicylate level     Status: Abnormal   Collection Time: 09/10/21  1:41 PM  Result Value Ref Range   Salicylate Lvl Q000111Q (L) 7.0 - 30.0 mg/dL    Comment: Performed at [facility], [address] [place], [place] [address]  Comprehensive metabolic panel     Status: Abnormal   Collection Time: 09/10/21  1:41 PM  Result Value Ref  Range   Sodium 141 135 - 145 mmol/L   Potassium 3.6 3.5 - 5.1 mmol/L   Chloride 107 98 - 111 mmol/L   CO2 26 22 - 32 mmol/L   Glucose, Bld 142 (H) 70 - 99 mg/dL    Comment: Glucose reference range applies only to samples taken after fasting for at least 8 hours.   BUN 16 6 - 20 mg/dL   Creatinine, Ser 0.66 0.44 - 1.00 mg/dL   Calcium 9.3 8.9 - 10.3 mg/dL   Total Protein 7.9 6.5 - 8.1 g/dL   Albumin 4.3 3.5 - 5.0 g/dL   AST 22 15 - 41 U/L   ALT 16 0 - 44 U/L   Alkaline Phosphatase 67 38 - 126 U/L  Total Bilirubin 0.6 0.3 - 1.2 mg/dL   GFR, Estimated >60 >60 mL/min    Comment: (NOTE) Calculated using the CKD-EPI Creatinine Equation (2021)    Anion gap 8 5 - 15    Comment: Performed at [facility], [address] [place], [place] [address]  Ethanol     Status: None   Collection Time: 09/10/21  1:41 PM  Result Value Ref Range   Alcohol, Ethyl (B) <10 <10 mg/dL    Comment: (NOTE) Lowest detectable limit for serum alcohol is 10 mg/dL.  For medical purposes only. Performed at [facility], [address] [place], [place] [address]   Resp Panel by RT-PCR (Flu A&B, Covid) Anterior Nasal Swab     Status: None   Collection Time: 09/10/21  2:26 PM   Specimen: Anterior Nasal Swab  Result Value Ref Range   SARS Coronavirus 2 by RT PCR NEGATIVE NEGATIVE    Comment: (NOTE) SARS-CoV-2 target nucleic acids are NOT DETECTED.  The SARS-CoV-2 RNA is generally detectable in upper respiratory specimens during the acute phase of infection. The lowest concentration of SARS-CoV-2 viral copies this assay can detect is 138 copies/mL. A negative result does not preclude SARS-Cov-2 infection and should not be used as the sole basis for treatment or other patient management decisions. A negative result may occur with  improper specimen collection/handling, submission of specimen other than nasopharyngeal swab, presence of viral mutation(s) within the areas targeted by this  assay, and inadequate number of viral copies(<138 copies/mL). A negative result must be combined with clinical observations, patient history, and epidemiological information. The expected result is Negative.  Fact Sheet for Patients:  EntrepreneurPulse.com.au  Fact Sheet for Healthcare Providers:  IncredibleEmployment.be  This test is no t yet approved or cleared by the [place] FDA and  has been authorized for detection and/or diagnosis of SARS-CoV-2 by FDA under an Emergency Use Authorization (EUA). This EUA will remain  in effect (meaning this test can be used) for the duration of the COVID-19 declaration under Section 564(b)(1) of the Act, 21 U.S.C.section 360bbb-3(b)(1), unless the authorization is terminated  or revoked sooner.       Influenza A by PCR NEGATIVE NEGATIVE   Influenza B by PCR NEGATIVE NEGATIVE    Comment: (NOTE) The Xpert Xpress SARS-CoV-2/FLU/RSV plus assay is intended as an aid in the diagnosis of influenza from Nasopharyngeal swab specimens and should not be used as a sole basis for treatment. Nasal washings and aspirates are unacceptable for Xpert Xpress SARS-CoV-2/FLU/RSV testing.  Fact Sheet for Patients: EntrepreneurPulse.com.au  Fact Sheet for Healthcare Providers: IncredibleEmployment.be  This test is not yet approved or cleared by the [place] FDA and has been authorized for detection and/or diagnosis of SARS-CoV-2 by FDA under an Emergency Use Authorization (EUA). This EUA will remain in effect (meaning this test can be used) for the duration of the COVID-19 declaration under Section 564(b)(1) of the Act, 21 U.S.C. section 360bbb-3(b)(1), unless the authorization is terminated or revoked.  Performed at [facility], [address] [place], [place] [address]   Urine Drug Screen, Qualitative [facility] only)     Status: Abnormal   Collection Time:  09/10/21  6:45 PM  Result Value Ref Range   Tricyclic, Ur Screen NONE DETECTED NONE DETECTED   Amphetamines, Ur Screen NONE DETECTED NONE DETECTED   MDMA (Ecstasy)Ur Screen NONE DETECTED NONE DETECTED   Cocaine Metabolite,Ur [place] NONE DETECTED NONE DETECTED   Opiate, Ur Screen NONE DETECTED NONE  DETECTED   Phencyclidine (PCP) Ur S NONE DETECTED NONE DETECTED   Cannabinoid 50 Ng, Ur [place] POSITIVE (A) NONE DETECTED   Barbiturates, Ur Screen NONE DETECTED NONE DETECTED   Benzodiazepine, Ur Scrn POSITIVE (A) NONE DETECTED   Methadone Scn, Ur NONE DETECTED NONE DETECTED    Comment: (NOTE) Tricyclics + metabolites, urine    Cutoff 1000 ng/mL Amphetamines + metabolites, urine  Cutoff 1000 ng/mL MDMA (Ecstasy), urine              Cutoff 500 ng/mL Cocaine Metabolite, urine          Cutoff 300 ng/mL Opiate + metabolites, urine        Cutoff 300 ng/mL Phencyclidine (PCP), urine         Cutoff 25 ng/mL Cannabinoid, urine                 Cutoff 50 ng/mL Barbiturates + metabolites, urine  Cutoff 200 ng/mL Benzodiazepine, urine              Cutoff 200 ng/mL Methadone, urine                   Cutoff 300 ng/mL  The urine drug screen provides only a preliminary, unconfirmed analytical test result and should not be used for non-medical purposes. Clinical consideration and professional judgment should be applied to any positive drug screen result due to possible interfering substances. A more specific alternate chemical method must be used in order to obtain a confirmed analytical result. Gas chromatography / mass spectrometry (GC/MS) is the preferred confirm atory method. Performed at [facility], [address] [place], [place] [address]   Pregnancy, urine     Status: None   Collection Time: 09/10/21  6:45 PM  Result Value Ref Range   Preg Test, Ur NEGATIVE NEGATIVE    Comment: Performed at [facility], [address] [place], [place] [address]    No current  facility-administered medications for this encounter.   No current outpatient medications on file.    Musculoskeletal: Strength & Muscle Tone: within normal limits Gait & Station: normal Patient leans: N/A  Psychiatric Specialty Exam:  Presentation  General Appearance: Appropriate for Environment  Eye Contact:Minimal  Speech:Clear and Coherent  Speech Volume:Normal  Handedness:Right   Mood and Affect  Mood:Anxious; Depressed  Affect:Depressed   Thought Process  Thought Processes:Coherent  Descriptions of Associations:Intact  Orientation:Full (Time, Place and Person)  Thought Content:Logical  History of Schizophrenia/Schizoaffective disorder:No data recorded Duration of Psychotic Symptoms:No data recorded Hallucinations:Hallucinations: None  Ideas of Reference:None  Suicidal Thoughts:Suicidal Thoughts: Yes, Active SI Active Intent and/or Plan: With Intent; With Plan  Homicidal Thoughts:Homicidal Thoughts: No   Sensorium  Memory:Immediate Good; Recent Good; Remote Good  Judgment:Poor  Insight:Poor   Executive Functions  Concentration:Fair  Attention Span:Good  Recall:Good  Fund of Knowledge:Good  Language:Good   Psychomotor Activity  Psychomotor Activity:Psychomotor Activity: Normal   Assets  Assets:Communication Skills; Desire for Improvement; Social Support   Sleep  Sleep:Sleep: Good   Physical Exam: Physical Exam Vitals and nursing note reviewed.  Constitutional:      Appearance: Normal appearance. She is obese.  HENT:     Head: Normocephalic and atraumatic.     Right Ear: External ear normal.     Left Ear: External ear normal.     Nose: Nose normal.     Mouth/Throat:     Mouth: Mucous membranes are moist.  Cardiovascular:     Rate and Rhythm:  Normal rate.     Pulses: Normal pulses.  Pulmonary:     Effort: Pulmonary effort is normal.  Musculoskeletal:        General: Normal range of motion.     Cervical back:  Normal range of motion.  Neurological:     General: No focal deficit present.     Mental Status: She is alert and oriented to person, place, and time.  Psychiatric:        Attention and Perception: Attention and perception normal.        Mood and Affect: Mood is anxious and depressed. Affect is blunt and flat.        Speech: Speech normal.        Behavior: Behavior is agitated and slowed. Behavior is cooperative.        Thought Content: Thought content includes suicidal ideation. Thought content includes suicidal plan.        Cognition and Memory: Cognition normal. Memory is impaired.        Judgment: Judgment is impulsive and inappropriate.    Review of Systems  Psychiatric/Behavioral:  Positive for depression, substance abuse and suicidal ideas. The patient is nervous/anxious.   All other systems reviewed and are negative.  Blood pressure 130/76, pulse 80, temperature 98.3 F (36.8 C), temperature source Oral, resp. rate 17, SpO2 98 %. There is no height or weight on file to calculate BMI.  Treatment Plan Summary: Plan Patient does meet the criteria for psychiatric inpatient admission  Disposition: Recommend psychiatric Inpatient admission when medically cleared. Supportive therapy provided about ongoing stressors.  Caroline Sauger, NP 09/10/2021 11:26 PM

## 2021-09-10 NOTE — ED Provider Notes (Signed)
Patient has passed a recommended 6-hour ops time discussed at signout.  She is now medically cleared.   Gilles Chiquito, MD 09/10/21 [phone]

## 2021-09-11 ENCOUNTER — Inpatient Hospital Stay
Admission: AD | Admit: 2021-09-11 | Discharge: 2021-09-20 | DRG: 885 | Disposition: A | Discharge status: Home / Self Care | Payer: 59 | Source: Intra-hospital | Attending: Psychiatry | Admitting: Psychiatry

## 2021-09-11 ENCOUNTER — Other Ambulatory Visit: Payer: Self-pay

## 2021-09-11 ENCOUNTER — Encounter: Payer: Self-pay | Admitting: Psychiatry

## 2021-09-11 DIAGNOSIS — R45850 Homicidal ideations: Secondary | ICD-10-CM | POA: Diagnosis present

## 2021-09-11 DIAGNOSIS — F1120 Opioid dependence, uncomplicated: Secondary | ICD-10-CM | POA: Diagnosis present

## 2021-09-11 DIAGNOSIS — Z882 Allergy status to sulfonamides status: Secondary | ICD-10-CM | POA: Diagnosis not present

## 2021-09-11 DIAGNOSIS — G4700 Insomnia, unspecified: Secondary | ICD-10-CM | POA: Diagnosis present

## 2021-09-11 DIAGNOSIS — Z79899 Other long term (current) drug therapy: Secondary | ICD-10-CM

## 2021-09-11 DIAGNOSIS — Z818 Family history of other mental and behavioral disorders: Secondary | ICD-10-CM

## 2021-09-11 DIAGNOSIS — R45851 Suicidal ideations: Secondary | ICD-10-CM | POA: Diagnosis present

## 2021-09-11 DIAGNOSIS — F332 Major depressive disorder, recurrent severe without psychotic features: Secondary | ICD-10-CM | POA: Diagnosis present

## 2021-09-11 DIAGNOSIS — F4310 Post-traumatic stress disorder, unspecified: Secondary | ICD-10-CM | POA: Diagnosis present

## 2021-09-11 DIAGNOSIS — F1190 Opioid use, unspecified, uncomplicated: Secondary | ICD-10-CM

## 2021-09-11 MED ORDER — ONDANSETRON 4 MG PO TBDP
4.0000 mg | ORAL_TABLET | Freq: Three times a day (TID) | ORAL | Status: DC | PRN
  Administered 2021-09-11: 4 mg via ORAL
  Filled 2021-09-11: qty 1

## 2021-09-11 MED ORDER — TRAZODONE HCL 100 MG PO TABS
100.0000 mg | ORAL_TABLET | Freq: Every evening | ORAL | Status: DC | PRN
  Administered 2021-09-12 – 2021-09-19 (×8): 100 mg via ORAL
  Filled 2021-09-11 (×8): qty 1

## 2021-09-11 MED ORDER — ACETAMINOPHEN 325 MG PO TABS
650.0000 mg | ORAL_TABLET | Freq: Four times a day (QID) | ORAL | Status: DC | PRN
  Administered 2021-09-12 – 2021-09-19 (×11): 650 mg via ORAL
  Filled 2021-09-11 (×11): qty 2

## 2021-09-11 MED ORDER — VENLAFAXINE HCL ER 75 MG PO CP24
150.0000 mg | ORAL_CAPSULE | Freq: Every day | ORAL | Status: DC
  Administered 2021-09-11 – 2021-09-12 (×2): 150 mg via ORAL
  Filled 2021-09-11 (×2): qty 2

## 2021-09-11 MED ORDER — ACETAMINOPHEN 500 MG PO TABS
1000.0000 mg | ORAL_TABLET | Freq: Once | ORAL | Status: AC
  Administered 2021-09-11: 1000 mg via ORAL
  Filled 2021-09-11: qty 2

## 2021-09-11 MED ORDER — ALUM & MAG HYDROXIDE-SIMETH 200-200-20 MG/5ML PO SUSP: 30.0000 mL | ORAL | Status: DC | PRN

## 2021-09-11 MED ORDER — MAGNESIUM HYDROXIDE 400 MG/5ML PO SUSP
30.0000 mL | Freq: Every day | ORAL | Status: DC | PRN
  Administered 2021-09-19: 30 mL via ORAL
  Filled 2021-09-11: qty 30

## 2021-09-11 MED ORDER — HYDROXYZINE HCL 50 MG PO TABS
50.0000 mg | ORAL_TABLET | Freq: Three times a day (TID) | ORAL | Status: DC | PRN
  Administered 2021-09-12 – 2021-09-19 (×12): 50 mg via ORAL
  Filled 2021-09-11 (×13): qty 1

## 2021-09-11 NOTE — ED Notes (Signed)
Mother visiting with patient.

## 2021-09-11 NOTE — BH Assessment (Signed)
Patient is to be admitted to [facility] by Dr. Toni Amend.  Attending Physician will be Dr.  Toni Amend .   Patient has been assigned to room 322, by [facility] Charge Nurse, Ivonne Andrew.     ER staff is aware of the admission: Glenda, ER Secretary   Dr. Derrill Kay, ER MD  Connye Burkitt, Patient's Nurse  Sue Lush Patient Access.

## 2021-09-11 NOTE — ED Provider Notes (Signed)
Emergency Medicine Observation Re-evaluation Note  Catherine Mitchell is a 36 y.o. female, seen on rounds today.  Pt initially presented to the ED for complaints of Suicide Attempt Currently, the patient is sleeping  Physical Exam  BP 130/76   Pulse 80   Temp 98.3 F (36.8 C) (Oral)   Resp 17   SpO2 98%  Physical Exam General: no distress Lungs: no increased wob Psych: calm   ED Course / MDM  EKG:EKG Interpretation  Date/Time:  Tuesday September 10 2021 13:28:11 EDT Ventricular Rate:  88 PR Interval:  134 QRS Duration: 107 QT Interval:  359 QTC Calculation: 435 R Axis:   96 Text Interpretation: Sinus rhythm Borderline right axis deviation Confirmed by UNCONFIRMED, DOCTOR ([id number]), editor Kearney Hard [phone]) on 09/10/2021 1:43:48 PM  I have reviewed the labs performed to date as well as medications administered while in observation.  Recent changes in the last 24 hours include pt medically cleared.  Plan  Current plan is for inpatient psych admission. Catherine Mitchell is under involuntary commitment.      Catherine Hacking, MD 09/11/21 [phone]

## 2021-09-11 NOTE — ED Notes (Signed)
IVC/pending psych inpatient admission 

## 2021-09-11 NOTE — Progress Notes (Signed)
Patient has been isolative to her room most of the day.  She does come out to talk on the phone often.  Staff inquired if she would be interested in getting snack before the food was put away, and she did then get snack.  She has no scheduled  QHS and declines the need for any medication to help with any symptoms she may be having or to aid in sleep.  She is pleasant and cooperative and easy to engage in conversation.  She endorses passive si/ anxiety /depression. She denies hi and pain. She has contracted for safety on the unit. Will continue to monitor. Support and encouragement offered. Q 15 minute safety checks in place.     C Butler-Nicholson, LPN

## 2021-09-11 NOTE — ED Notes (Signed)
Using phone.

## 2021-09-11 NOTE — Tx Team (Signed)
Initial Treatment Plan 09/11/2021 6:13 PM Jayline Kilburg KZL:[id number]    PATIENT STRESSORS: Legal issue   Substance abuse     PATIENT STRENGTHS: Motivation for treatment/growth  Physical Health    PATIENT IDENTIFIED PROBLEMS: Ineffective coping skills  Substance Abuse                   DISCHARGE CRITERIA:  Improved stabilization in mood, thinking, and/or behavior Motivation to continue treatment in a less acute level of care  PRELIMINARY DISCHARGE PLAN: Attend 12-step recovery group Outpatient therapy  PATIENT/FAMILY INVOLVEMENT: This treatment plan has been presented to and reviewed with the patient, Diann Bangerter, and/or family member.  The patient and family have been given the opportunity to ask questions and make suggestions.  Sharin Mons, RN 09/11/2021, 6:13 PM

## 2021-09-11 NOTE — ED Notes (Signed)
Senk,Markie Mother [phone]   [phone]    Called to update on patient per patient request, no answer Mailbox full.

## 2021-09-11 NOTE — ED Notes (Signed)
Pt c/o nausea. EDP informed.

## 2021-09-11 NOTE — Consult Note (Signed)
Writer spoke with patient's mother at request of mother. Mother voiced that she feels patient "needs help." Mother provides the information that patient has seen Dr. Evelene Croon in [place] for "years" and patient has been off medications since January 2023. Mother states patient has been "acting out and doing things she wouldn't normally do", but she doesn't know what those things are because police will not tell mother what charges patient has.  Mother requests that patient not  go to hospital far away because "I'm old and I will get lost trying to visit her." Mother also states that the patient's name is "Friend",;it is incorrect on our chart. Writer notified ED Diplomatic Services operational officer, Marchelle Folks.   Vanetta Mulders, PMHNP-BC
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
## 2021-09-11 NOTE — ED Notes (Signed)
Hospital meal provided.  100% consumed, pt tolerated w/o complaints.  Waste discarded appropriately.   

## 2021-09-11 NOTE — Plan of Care (Signed)
Problem: Education: Goal: Knowledge of General Education information will improve Description: Including pain rating scale, medication(s)/side effects and non-pharmacologic comfort measures Outcome: Not Progressing   Problem: Health Behavior/Discharge Planning: Goal: Ability to manage health-related needs will improve Outcome: Not Progressing   Problem: Clinical Measurements: Goal: Ability to maintain clinical measurements within normal limits will improve Outcome: Not Progressing Goal: Will remain free from infection Outcome: Not Progressing Goal: Diagnostic test results will improve Outcome: Not Progressing Goal: Respiratory complications will improve Outcome: Not Progressing Goal: Cardiovascular complication will be avoided Outcome: Not Progressing   Problem: Activity: Goal: Risk for activity intolerance will decrease Outcome: Not Progressing   Problem: Nutrition: Goal: Adequate nutrition will be maintained Outcome: Not Progressing   Problem: Coping: Goal: Level of anxiety will decrease Outcome: Not Progressing   Problem: Elimination: Goal: Will not experience complications related to bowel motility Outcome: Not Progressing Goal: Will not experience complications related to urinary retention Outcome: Not Progressing   Problem: Pain Managment: Goal: General experience of comfort will improve Outcome: Not Progressing   Problem: Safety: Goal: Ability to remain free from injury will improve Outcome: Not Progressing   Problem: Skin Integrity: Goal: Risk for impaired skin integrity will decrease Outcome: Not Progressing   Problem: Education: Goal: Knowledge of [place] General Education information/materials will improve Outcome: Not Progressing Goal: Emotional status will improve Outcome: Not Progressing Goal: Mental status will improve Outcome: Not Progressing Goal: Verbalization of understanding the information provided will improve Outcome: Not  Progressing   Problem: Activity: Goal: Interest or engagement in activities will improve Outcome: Not Progressing Goal: Sleeping patterns will improve Outcome: Not Progressing   Problem: Coping: Goal: Ability to verbalize frustrations and anger appropriately will improve Outcome: Not Progressing Goal: Ability to demonstrate self-control will improve Outcome: Not Progressing   Problem: Health Behavior/Discharge Planning: Goal: Identification of resources available to assist in meeting health care needs will improve Outcome: Not Progressing Goal: Compliance with treatment plan for underlying cause of condition will improve Outcome: Not Progressing   Problem: Physical Regulation: Goal: Ability to maintain clinical measurements within normal limits will improve Outcome: Not Progressing   Problem: Safety: Goal: Periods of time without injury will increase Outcome: Not Progressing   Problem: Education: Goal: Utilization of techniques to improve thought processes will improve Outcome: Not Progressing Goal: Knowledge of the prescribed therapeutic regimen will improve Outcome: Not Progressing   Problem: Activity: Goal: Interest or engagement in leisure activities will improve Outcome: Not Progressing Goal: Imbalance in normal sleep/wake cycle will improve Outcome: Not Progressing   Problem: Coping: Goal: Coping ability will improve Outcome: Not Progressing Goal: Will verbalize feelings Outcome: Not Progressing   Problem: Health Behavior/Discharge Planning: Goal: Ability to make decisions will improve Outcome: Not Progressing Goal: Compliance with therapeutic regimen will improve Outcome: Not Progressing   Problem: Role Relationship: Goal: Will demonstrate positive changes in social behaviors and relationships Outcome: Not Progressing   Problem: Safety: Goal: Ability to disclose and discuss suicidal ideas will improve Outcome: Not Progressing Goal: Ability to identify  and utilize support systems that promote safety will improve Outcome: Not Progressing   Problem: Self-Concept: Goal: Will verbalize positive feelings about self Outcome: Not Progressing Goal: Level of anxiety will decrease Outcome: Not Progressing   Problem: Education: Goal: Ability to state activities that reduce stress will improve Outcome: Not Progressing   Problem: Coping: Goal: Ability to identify and develop effective coping behavior will improve Outcome: Not Progressing   Problem: Self-Concept: Goal: Ability to identify factors that promote   anxiety will improve Outcome: Not Progressing Goal: Level of anxiety will decrease Outcome: Not Progressing Goal: Ability to modify response to factors that promote anxiety will improve Outcome: Not Progressing   

## 2021-09-11 NOTE — ED Notes (Signed)
Mother here to visit with patient, gives phone number and name of outpatient psychiatrist. Also most recent list of meds.  Dr. Milagros Evener [phone]. Placed on chart.

## 2021-09-11 NOTE — ED Notes (Addendum)
Report to [facility], Charity fundraiser

## 2021-09-12 DIAGNOSIS — F1190 Opioid use, unspecified, uncomplicated: Secondary | ICD-10-CM

## 2021-09-12 DIAGNOSIS — F4310 Post-traumatic stress disorder, unspecified: Secondary | ICD-10-CM

## 2021-09-12 DIAGNOSIS — F332 Major depressive disorder, recurrent severe without psychotic features: Secondary | ICD-10-CM | POA: Diagnosis not present

## 2021-09-12 MED ORDER — BUPRENORPHINE HCL-NALOXONE HCL 8-2 MG SL SUBL
1.0000 | SUBLINGUAL_TABLET | Freq: Two times a day (BID) | SUBLINGUAL | Status: DC
  Administered 2021-09-12 – 2021-09-13 (×2): 1 via SUBLINGUAL
  Filled 2021-09-12 (×3): qty 1

## 2021-09-12 MED ORDER — CLONAZEPAM 0.5 MG PO TABS
0.5000 mg | ORAL_TABLET | Freq: Three times a day (TID) | ORAL | Status: DC
  Administered 2021-09-12 – 2021-09-20 (×24): 0.5 mg via ORAL
  Filled 2021-09-12 (×24): qty 1

## 2021-09-12 MED ORDER — GABAPENTIN 300 MG PO CAPS
600.0000 mg | ORAL_CAPSULE | Freq: Three times a day (TID) | ORAL | Status: DC | PRN
  Administered 2021-09-12 – 2021-09-19 (×13): 600 mg via ORAL
  Filled 2021-09-12 (×13): qty 2

## 2021-09-12 MED ORDER — VENLAFAXINE HCL ER 75 MG PO CP24
225.0000 mg | ORAL_CAPSULE | Freq: Every day | ORAL | Status: DC
  Administered 2021-09-13 – 2021-09-20 (×8): 225 mg via ORAL
  Filled 2021-09-12 (×8): qty 3
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
## 2021-09-12 NOTE — Progress Notes (Signed)
Nutrition Brief Note  Patient identified on the Malnutrition Screening Tool (MST) Report  Wt Readings from Last 15 Encounters:  09/11/21 125.2 kg    Body mass index is 41.97 kg/m. Patient meets criteria for obesity, class III based on current BMI. Obesity is a complex, chronic medical condition that is optimally managed by a multidisciplinary care team. Weight loss is not an ideal goal for an acute inpatient hospitalization. However, if further work-up for obesity is warranted, consider outpatient referral to outpatient bariatric service and/or [facility].    Current diet order is regular, patient is consuming approximately 100% of meals at this time. Labs and medications reviewed.   No nutrition interventions warranted at this time. If nutrition issues arise, please consult RD.   Levada Schilling, RD, LDN, CDCES Registered Dietitian II Certified Diabetes Care and Education Specialist Please refer to [facility] for RD and/or RD on-call/weekend/after hours pager

## 2021-09-12 NOTE — [facility] Counselor (Addendum)
CSW met with pt briefly to give [name]. No other concerns expressed. Contact ended without incident.  Catherine Mitchell. [name], MSW, LCSW, [place] 09/12/2021 2:30 PM

## 2021-09-12 NOTE — Progress Notes (Signed)
Recreation Therapy Notes  INPATIENT RECREATION THERAPY ASSESSMENT  Patient Details Name: Catherine Mitchell MRN: [id number] DOB: 03/05/1985 Today's Date: 09/12/2021       Information Obtained From: Patient  Able to Participate in Assessment/Interview: Yes  Patient Presentation: Responsive  Reason for Admission (Per Patient): Active Symptoms, Med Non-Compliance, Suicidal Ideation  Patient Stressors: Family, Other (Comment) Barrister's clerk)  Coping Skills:   Isolation, Deep Breathing, Prayer  Leisure Interests (2+):  Social - Friends, Individual - Reading, Music - Listen, Individual - TV (Walk dog)  Frequency of Recreation/Participation: Weekly  Awareness of Community Resources:  Yes  Community Resources:  Park  Current Use: Yes  If no, Barriers?:    Expressed Interest in [employer] Information: Yes  [place]idence[place]  Patient Main Form of Transportation: Set designer  Patient Strengths:  Honest  Patient Identified Areas of Improvement:  Take medication and use coping skills  Patient Goal for Hospitalization:  Get medicine adjusted and get back to normal self  Current SI (including self-harm):  Yes (Contract for safety)  Current HI:  Yes  Current AVH: Yes  Staff Intervention Plan: Group Attendance, Collaborate with Interdisciplinary Treatment Team  Consent to Intern Participation: N/A  Fay Swider 09/12/2021, 2:05 PM
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
## 2021-09-12 NOTE — Plan of Care (Signed)
D: Pt alert and oriented. Pt rates depression 10/10, hopelessness 10/10, and anxiety 10/10. Pt goal: "Self esteem - Feeling better about myself and life, decrease anxiety." Pt reports energy level as hyper and concentration as being poor. Pt reports sleep last night as being poor. Pt did not receive medications for sleep. Pt reports experiencing 7/10 generalized body aches/pain at this time, prn meds given. Pt denies experiencing any HI at this time however, endorses SI w/o plan while here. Pt verbally contracts for safety while here. Pt also reports seeing glass status and floating squares and shares that she hears voices stating she is not good enough and that she should harm herself.  A: Scheduled medications administered to pt, per MD orders. Support and encouragement provided. Frequent verbal contact made. Routine safety checks conducted q15 minutes.   R: No adverse drug reactions noted. Pt verbally contracts for safety at this time. Pt compliant with medications. Pt interacts minimally with others on the unit, self isolates to room. Pt remains safe at this time. Will continue to monitor.   Problem: Education: Goal: Knowledge of General Education information will improve Description: Including pain rating scale, medication(s)/side effects and non-pharmacologic comfort measures Outcome: Progressing   Problem: Coping: Goal: Level of anxiety will decrease Outcome: Not Progressing
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
## 2021-09-12 NOTE — Plan of Care (Signed)
  Problem: Nutrition: Goal: Adequate nutrition will be maintained Outcome: Progressing   Problem: Pain Managment: Goal: General experience of comfort will improve Outcome: Progressing   Problem: Education: Goal: Knowledge of [place] [facility] Education information/materials will improve Outcome: Progressing   Problem: Coping: Goal: Ability to demonstrate self-control will improve Outcome: Progressing   Problem: Education: Goal: Knowledge of the prescribed therapeutic regimen will improve Outcome: Progressing

## 2021-09-12 NOTE — Group Note (Signed)
[facility] LCSW Group Therapy Note   Group Date: 09/12/2021 Start Time: 1300 End Time: 1400   Type of Therapy/Topic:  Group Therapy:  Balance in Life  Participation Level:  Active   Description of Group:    This group will address the concept of balance and how it feels and looks when one is unbalanced. Patients will be encouraged to process areas in their lives that are out of balance, and identify reasons for remaining unbalanced. Facilitators will guide patients utilizing problem- solving interventions to address and correct the stressor making their life unbalanced. Understanding and applying boundaries will be explored and addressed for obtaining  and maintaining a balanced life. Patients will be encouraged to explore ways to assertively make their unbalanced needs known to significant others in their lives, using other group members and facilitator for support and feedback.  Therapeutic Goals: Patient will identify two or more emotions or situations they have that consume much of in their lives. Patient will identify signs/triggers that life has become out of balance:  Patient will identify two ways to set boundaries in order to achieve balance in their lives:  Patient will demonstrate ability to communicate their needs through discussion and/or role plays  Summary of Patient Progress: Patient was present for the entirety of the group process. She was actively involved in the discussion. She stated that she thinks of scales when balance is mentions. Pt shared that she got off balance when she stopped taking her medication and when she was not communicating her needs. She admitted to feeling off balance currently and expressed desire to get back in balance. Pt states she maintains balance by eating right and taking her medications like she is supposed to. She shared her history of recovery and being able to maintain this through an actively involved support system. Pt also requested a copy of WRAP  plan template to develop her own wellness plan. Pt was open and receptive to feedback/comments from both peers and facilitator.   Therapeutic Modalities:   Cognitive Behavioral Therapy Solution-Focused Therapy Assertiveness Training   Glenis Smoker, LCSW

## 2021-09-12 NOTE — Progress Notes (Signed)
Recreation Therapy Notes  INPATIENT RECREATION TR PLAN  Patient Details Name: Catherine Mitchell MRN: [id number] DOB: [birth year] Today's Date: 09/12/2021  Rec Therapy Plan Is patient appropriate for Therapeutic Recreation?: Yes Treatment times per week: at least 3 Estimated Length of Stay: 5-7 days TR Treatment/Interventions: Group participation (Comment)  Discharge Criteria Pt will be discharged from therapy if:: Discharged Treatment plan/goals/alternatives discussed and agreed upon by:: Patient/family  Discharge Summary     Olumide Dolinger 09/12/2021, 2:06 PM

## 2021-09-12 NOTE — Progress Notes (Signed)
Recreation Therapy Notes  Date: 09/12/2021   Time: 9:40 am     Location: Craft room     Behavioral response: N/A   Intervention Topic: Life Planning   Discussion/Intervention: Patient refused to attend group.    Clinical Observations/Feedback:  Patient refused to attend group.    Vernice Bowker LRT/CTRS         Kawhi Diebold 09/12/2021 10:21 AM

## 2021-09-13 DIAGNOSIS — F332 Major depressive disorder, recurrent severe without psychotic features: Secondary | ICD-10-CM | POA: Diagnosis not present

## 2021-09-13 MED ORDER — BUPRENORPHINE HCL-NALOXONE HCL 8-2 MG SL SUBL
1.0000 | SUBLINGUAL_TABLET | Freq: Three times a day (TID) | SUBLINGUAL | Status: DC
  Administered 2021-09-13 – 2021-09-20 (×21): 1 via SUBLINGUAL
  Filled 2021-09-13 (×20): qty 1

## 2021-09-13 MED ORDER — CARBAMAZEPINE 200 MG PO TABS
200.0000 mg | ORAL_TABLET | Freq: Two times a day (BID) | ORAL | Status: DC
  Administered 2021-09-13 – 2021-09-20 (×14): 200 mg via ORAL
  Filled 2021-09-13 (×14): qty 1

## 2021-09-13 NOTE — Plan of Care (Signed)
D- Patient alert and oriented. Patient presented in an anxious, depressed, but pleasant mood on assessment stating that last night was "the first time I've been able to sleep", and had complaints of generalized pain. Patient rated her pain a "5/10", in which she did request PRN pain medication from this Clinical research associate. Patient endorsed SI/HI/AVH, stating that she is "hopeless and helpless" because of "everything going on in my life. My life, my family, financial and legal issues. I'm just stressed". Patient stated that her plan is to use "anything I can get my hands on", however, she did contract for safety with this Clinical research associate. Patient's goal for treatment, per treatment team, is that "I want some hope, to care for myself and build my self-esteem. I would like to see my depression, voices, and visions get better".  A- Scheduled medications administered to patient, per MD orders. Support and encouragement provided.  Routine safety checks conducted every 15 minutes.  Patient informed to notify staff with problems or concerns.  R- No adverse drug reactions noted. Patient contracts for safety at this time. Patient compliant with medications and treatment plan. Patient receptive, calm, and cooperative. Patient interacts well with others on the unit. Patient remains safe at this time.  Problem: Education: Goal: Knowledge of General Education information will improve Description: Including pain rating scale, medication(s)/side effects and non-pharmacologic comfort measures Outcome: Not Progressing   Problem: Health Behavior/Discharge Planning: Goal: Ability to manage health-related needs will improve Outcome: Not Progressing   Problem: Clinical Measurements: Goal: Ability to maintain clinical measurements within normal limits will improve Outcome: Not Progressing Goal: Will remain free from infection Outcome: Not Progressing Goal: Diagnostic test results will improve Outcome: Not Progressing Goal: Respiratory  complications will improve Outcome: Not Progressing Goal: Cardiovascular complication will be avoided Outcome: Not Progressing   Problem: Activity: Goal: Risk for activity intolerance will decrease Outcome: Not Progressing   Problem: Nutrition: Goal: Adequate nutrition will be maintained Outcome: Not Progressing   Problem: Coping: Goal: Level of anxiety will decrease Outcome: Not Progressing   Problem: Elimination: Goal: Will not experience complications related to bowel motility Outcome: Not Progressing Goal: Will not experience complications related to urinary retention Outcome: Not Progressing   Problem: Pain Managment: Goal: General experience of comfort will improve Outcome: Not Progressing   Problem: Safety: Goal: Ability to remain free from injury will improve Outcome: Not Progressing   Problem: Skin Integrity: Goal: Risk for impaired skin integrity will decrease Outcome: Not Progressing   Problem: Education: Goal: Knowledge of [place] [facility] Education information/materials will improve Outcome: Not Progressing Goal: Emotional status will improve Outcome: Not Progressing Goal: Mental status will improve Outcome: Not Progressing Goal: Verbalization of understanding the information provided will improve Outcome: Not Progressing   Problem: Activity: Goal: Interest or engagement in activities will improve Outcome: Not Progressing Goal: Sleeping patterns will improve Outcome: Not Progressing   Problem: Coping: Goal: Ability to verbalize frustrations and anger appropriately will improve Outcome: Not Progressing Goal: Ability to demonstrate self-control will improve Outcome: Not Progressing   Problem: Health Behavior/Discharge Planning: Goal: Identification of resources available to assist in meeting health care needs will improve Outcome: Not Progressing Goal: Compliance with treatment plan for underlying cause of condition will improve Outcome: Not  Progressing   Problem: Physical Regulation: Goal: Ability to maintain clinical measurements within normal limits will improve Outcome: Not Progressing   Problem: Safety: Goal: Periods of time without injury will increase Outcome: Not Progressing   Problem: Education: Goal: Utilization of techniques to improve  thought processes will improve Outcome: Not Progressing Goal: Knowledge of the prescribed therapeutic regimen will improve Outcome: Not Progressing   Problem: Activity: Goal: Interest or engagement in leisure activities will improve Outcome: Not Progressing Goal: Imbalance in normal sleep/wake cycle will improve Outcome: Not Progressing   Problem: Coping: Goal: Coping ability will improve Outcome: Not Progressing Goal: Will verbalize feelings Outcome: Not Progressing   Problem: Health Behavior/Discharge Planning: Goal: Ability to make decisions will improve Outcome: Not Progressing Goal: Compliance with therapeutic regimen will improve Outcome: Not Progressing   Problem: Role Relationship: Goal: Will demonstrate positive changes in social behaviors and relationships Outcome: Not Progressing   Problem: Safety: Goal: Ability to disclose and discuss suicidal ideas will improve Outcome: Not Progressing Goal: Ability to identify and utilize support systems that promote safety will improve Outcome: Not Progressing   Problem: Self-Concept: Goal: Will verbalize positive feelings about self Outcome: Not Progressing Goal: Level of anxiety will decrease Outcome: Not Progressing   Problem: Education: Goal: Ability to state activities that reduce stress will improve Outcome: Not Progressing   Problem: Coping: Goal: Ability to identify and develop effective coping behavior will improve Outcome: Not Progressing   Problem: Self-Concept: Goal: Ability to identify factors that promote anxiety will improve Outcome: Not Progressing Goal: Level of anxiety will  decrease Outcome: Not Progressing Goal: Ability to modify response to factors that promote anxiety will improve Outcome: Not Progressing

## 2021-09-13 NOTE — Progress Notes (Signed)
[facility] MD Progress Note  09/13/2021 4:20 PM Catherine Mitchell  MRN:  [id number] Subjective: Follow-up 36 year old woman with recurrent depression.  Patient says she is still feeling anxious also that at times she still hears things.  Passive hopelessness but no active suicidal ideation.  Cooperative on the unit.  Still feels tired and run down much of the time.  Patient suggested that we restart Tegretol which she says in the past has helped her a lot with mood stability.  She also agreed to my suggestion that we try increasing her Suboxone as she seems to still be having withdrawal symptoms.  Increased to 8 mg Suboxone 3 times a day.  Add Tegretol 200 twice a day.  Encourage group attendance Principal Problem: Severe recurrent major depression without psychotic features (HCC) Diagnosis: Principal Problem:   Severe recurrent major depression without psychotic features (HCC) Active Problems:   Opioid use disorder   PTSD (post-traumatic stress disorder)  Total Time spent with patient: 20 minutes  Past Psychiatric History: Past history of mood instability and substance use issues and PTSD  Past Medical History: History reviewed. No pertinent past medical history. History reviewed. No pertinent surgical history. Family History: History reviewed. No pertinent family history. Family Psychiatric  History: See previous Social History:  Social History   Substance and Sexual Activity  Alcohol Use Not Currently     Social History   Substance and Sexual Activity  Drug Use Yes   Types: Marijuana    Social History   Socioeconomic History   Marital status: Single    Spouse name: Not on file   Number of children: Not on file   Years of education: Not on file   Highest education level: Not on file  Occupational History   Not on file  Tobacco Use   Smoking status: Never   Smokeless tobacco: Current  Substance and Sexual Activity   Alcohol use: Not Currently   Drug use: Yes    Types: Marijuana    Sexual activity: Not Currently  Other Topics Concern   Not on file  Social History Narrative   Not on file   Social Determinants of Health   Financial Resource Strain: Not on file  Food Insecurity: Not on file  Transportation Needs: Not on file  Physical Activity: Not on file  Stress: Not on file  Social Connections: Not on file   Additional Social History:                         Sleep: Fair  Appetite:  Fair  Current Medications: Current Facility-Administered Medications  Medication Dose Route Frequency Provider Last Rate Last Admin   acetaminophen (TYLENOL) tablet 650 mg  650 mg Oral Q6H PRN Gervis Gaba T, MD   650 mg at 09/13/21 1428   alum & mag hydroxide-simeth (MAALOX/MYLANTA) 200-200-20 MG/5ML suspension 30 mL  30 mL Oral Q4H PRN Deshawna Mcneece T, MD       buprenorphine-naloxone (SUBOXONE) 8-2 mg per SL tablet 1 tablet  1 tablet Sublingual TID Tacora Athanas, [name], MD       carbamazepine (TEGRETOL) tablet 200 mg  200 mg Oral BID Danashia Landers, [name], MD       clonazePAM Scarlette Calico) tablet 0.5 mg  0.5 mg Oral TID Curry Dulski T, MD   0.5 mg at 09/13/21 1256   gabapentin (NEURONTIN) capsule 600 mg  600 mg Oral Q8H PRN Ona Rathert, [name], MD   600 mg at 09/13/21 1256  hydrOXYzine (ATARAX) tablet 50 mg  50 mg Oral TID PRN Damiya Sandefur T, MD   50 mg at 09/12/21 2047   magnesium hydroxide (MILK OF MAGNESIA) suspension 30 mL  30 mL Oral Daily PRN Salam Chesterfield, [name], MD       traZODone (DESYREL) tablet 100 mg  100 mg Oral QHS PRN Anndee Connett T, MD   100 mg at 09/12/21 2047   venlafaxine XR (EFFEXOR-XR) 24 hr capsule 225 mg  225 mg Oral Q breakfast Cecilia Vancleve, [name], MD   225 mg at 09/13/21 0809    Lab Results: No results found for this or any previous visit (from the past 48 hour(s)).  Blood Alcohol level:  Lab Results  Component Value Date   ETH <10 09/10/2021    Metabolic Disorder Labs: No results found for: "HGBA1C", "MPG" No results found for: "PROLACTIN" No results  found for: "CHOL", "TRIG", "HDL", "CHOLHDL", "VLDL", "LDLCALC"  Physical Findings: AIMS:  , ,  ,  ,    CIWA:    COWS:     Musculoskeletal: Strength & Muscle Tone: within normal limits Gait & Station: normal Patient leans: N/A  Psychiatric Specialty Exam:  Presentation  General Appearance: Appropriate for Environment  Eye Contact:Minimal  Speech:Clear and Coherent  Speech Volume:Normal  Handedness:Right   Mood and Affect  Mood:Anxious; Depressed  Affect:Depressed   Thought Process  Thought Processes:Coherent  Descriptions of Associations:Intact  Orientation:Full (Time, Place and Person)  Thought Content:Logical  History of Schizophrenia/Schizoaffective disorder:No data recorded Duration of Psychotic Symptoms:No data recorded Hallucinations:No data recorded Ideas of Reference:None  Suicidal Thoughts:No data recorded Homicidal Thoughts:No data recorded  Sensorium  Memory:Immediate Good; Recent Good; Remote Good  Judgment:Poor  Insight:Poor   Executive Functions  Concentration:Fair  Attention Span:Good  Recall:Good  Fund of Knowledge:Good  Language:Good   Psychomotor Activity  Psychomotor Activity:No data recorded  Assets  Assets:Communication Skills; Desire for Improvement; Social Support   Sleep  Sleep:No data recorded   Physical Exam: Physical Exam Vitals and nursing note reviewed.  Constitutional:      Appearance: Normal appearance.  HENT:     Head: Normocephalic and atraumatic.     Mouth/Throat:     Pharynx: Oropharynx is clear.  Eyes:     Pupils: Pupils are equal, round, and reactive to light.  Cardiovascular:     Rate and Rhythm: Normal rate and regular rhythm.  Pulmonary:     Effort: Pulmonary effort is normal.     Breath sounds: Normal breath sounds.  Abdominal:     General: Abdomen is flat.     Palpations: Abdomen is soft.  Musculoskeletal:        General: Normal range of motion.  Skin:    General: Skin is  warm and dry.  Neurological:     General: No focal deficit present.     Mental Status: She is alert. Mental status is at baseline.  Psychiatric:        Attention and Perception: Attention normal. She perceives auditory hallucinations.        Mood and Affect: Mood is anxious.        Speech: Speech is delayed.        Behavior: Behavior is slowed.        Thought Content: Thought content normal.        Cognition and Memory: Cognition normal.    Review of Systems  Constitutional: Negative.   HENT: Negative.    Eyes: Negative.   Respiratory: Negative.  Cardiovascular: Negative.   Gastrointestinal: Negative.   Musculoskeletal: Negative.   Skin: Negative.   Neurological: Negative.   Psychiatric/Behavioral:  Positive for depression and hallucinations. The patient is nervous/anxious.    Blood pressure 130/77, pulse 69, temperature 97.8 F (36.6 C), temperature source Oral, resp. rate 18, height 5\' 8"  (1.727 m), weight 125.2 kg, SpO2 100 %. Body mass index is 41.97 kg/m.   Treatment Plan Summary: Plan see note above for medication changes.  Encourage group attendance.  , MD 09/13/2021, 4:20 PM

## 2021-09-13 NOTE — BH IP Treatment Plan (Signed)
Interdisciplinary Treatment and Diagnostic Plan Update  09/13/2021 Time of Session: 09:41 Mekia Dipinto MRN: [id number]  Principal Diagnosis: Severe recurrent major depression without psychotic features [facility])  Secondary Diagnoses: Principal Problem:   Severe recurrent major depression without psychotic features (HCC) Active Problems:   Opioid use disorder   PTSD (post-traumatic stress disorder)   Current Medications:  Current Facility-Administered Medications  Medication Dose Route Frequency Provider Last Rate Last Admin   acetaminophen (TYLENOL) tablet 650 mg  650 mg Oral Q6H PRN Clapacs, John T, MD   650 mg at 09/13/21 1428   alum & mag hydroxide-simeth (MAALOX/MYLANTA) 200-200-20 MG/5ML suspension 30 mL  30 mL Oral Q4H PRN Clapacs, John T, MD       buprenorphine-naloxone (SUBOXONE) 8-2 mg per SL tablet 1 tablet  1 tablet Sublingual TID Clapacs, [name], MD       carbamazepine (TEGRETOL) tablet 200 mg  200 mg Oral BID Clapacs, [name], MD       clonazePAM Scarlette Calico) tablet 0.5 mg  0.5 mg Oral TID Clapacs, John T, MD   0.5 mg at 09/13/21 1256   gabapentin (NEURONTIN) capsule 600 mg  600 mg Oral Q8H PRN Clapacs, John T, MD   600 mg at 09/13/21 1256   hydrOXYzine (ATARAX) tablet 50 mg  50 mg Oral TID PRN Clapacs, John T, MD   50 mg at 09/12/21 2047   magnesium hydroxide (MILK OF MAGNESIA) suspension 30 mL  30 mL Oral Daily PRN Clapacs, [name], MD       traZODone (DESYREL) tablet 100 mg  100 mg Oral QHS PRN Clapacs, John T, MD   100 mg at 09/12/21 2047   venlafaxine XR (EFFEXOR-XR) 24 hr capsule 225 mg  225 mg Oral Q breakfast Clapacs, [name], MD   225 mg at 09/13/21 0809   PTA Medications: No medications prior to admission.    Patient Stressors: Legal issue   Substance abuse    Patient Strengths: Motivation for treatment/growth  Physical Health   Treatment Modalities: Medication Management, Group therapy, Case management,  1 to 1 session with clinician, Psychoeducation,  Recreational therapy.   Physician Treatment Plan for Primary Diagnosis: Severe recurrent major depression without psychotic features (HCC) Long Term Goal(s): Improvement in symptoms so as ready for discharge   Short Term Goals: Ability to demonstrate self-control will improve Compliance with prescribed medications will improve Ability to verbalize feelings will improve Ability to disclose and discuss suicidal ideas  Medication Management: Evaluate patient's response, side effects, and tolerance of medication regimen.  Therapeutic Interventions: 1 to 1 sessions, Unit Group sessions and Medication administration.  Evaluation of Outcomes: Progressing  Physician Treatment Plan for Secondary Diagnosis: Principal Problem:   Severe recurrent major depression without psychotic features (HCC) Active Problems:   Opioid use disorder   PTSD (post-traumatic stress disorder)  Long Term Goal(s): Improvement in symptoms so as ready for discharge   Short Term Goals: Ability to demonstrate self-control will improve Compliance with prescribed medications will improve Ability to verbalize feelings will improve Ability to disclose and discuss suicidal ideas     Medication Management: Evaluate patient's response, side effects, and tolerance of medication regimen.  Therapeutic Interventions: 1 to 1 sessions, Unit Group sessions and Medication administration.  Evaluation of Outcomes: Progressing   RN Treatment Plan for Primary Diagnosis: Severe recurrent major depression without psychotic features (HCC) Long Term Goal(s): Knowledge of disease and therapeutic regimen to maintain health will improve  Short Term Goals: Ability to remain free from  injury will improve, Ability to verbalize frustration and anger appropriately will improve, Ability to demonstrate self-control, Ability to participate in decision making will improve, Ability to verbalize feelings will improve, Ability to disclose and discuss  suicidal ideas, Ability to identify and develop effective coping behaviors will improve, and Compliance with prescribed medications will improve  Medication Management: RN will administer medications as ordered by provider, will assess and evaluate patient's response and provide education to patient for prescribed medication. RN will report any adverse and/or side effects to prescribing provider.  Therapeutic Interventions: 1 on 1 counseling sessions, Psychoeducation, Medication administration, Evaluate responses to treatment, Monitor vital signs and CBGs as ordered, Perform/monitor CIWA, COWS, AIMS and Fall Risk screenings as ordered, Perform wound care treatments as ordered.  Evaluation of Outcomes: Progressing   LCSW Treatment Plan for Primary Diagnosis: Severe recurrent major depression without psychotic features (HCC) Long Term Goal(s): Safe transition to appropriate next level of care at discharge, Engage patient in therapeutic group addressing interpersonal concerns.  Short Term Goals: Engage patient in aftercare planning with referrals and resources, Increase social support, Increase ability to appropriately verbalize feelings, Increase emotional regulation, Facilitate acceptance of mental health diagnosis and concerns, Facilitate patient progression through stages of change regarding substance use diagnoses and concerns, Identify triggers associated with mental health/substance abuse issues, and Increase skills for wellness and recovery  Therapeutic Interventions: Assess for all discharge needs, 1 to 1 time with Social worker, Explore available resources and support systems, Assess for adequacy in community support network, Educate family and significant other(s) on suicide prevention, Complete Psychosocial Assessment, Interpersonal group therapy.  Evaluation of Outcomes: Progressing   Progress in Treatment: Attending groups: Yes. Participating in groups: Yes. Taking medication as  prescribed: Yes. Toleration medication: Yes. Family/Significant other contact made: Yes, individual(s) contacted:  SPE completed with mother. Patient understands diagnosis: Yes. Discussing patient identified problems/goals with staff: Yes. Medical problems stabilized or resolved: Yes. Denies suicidal/homicidal ideation: Yes. Issues/concerns per patient self-inventory: No. Other: none.  New problem(s) identified: No, Describe:  none identified.  New Short Term/Long Term Goal(s): detox, elimination of symptoms of psychosis, medication management for mood stabilization; elimination of SI thoughts; development of comprehensive mental wellness/sobriety plan.  Patient Goals:  "I want to get to where I don't feel so hopeless. Would like to get some hope back os that I don't feel like killing myself. My self-esteem. My depression is real bad right now, I'd like to see it get better and the voices."  Discharge Plan or Barriers: CSW will assist pt with development of an appropriate aftercare/discharge plan.   Reason for Continuation of Hospitalization: Anxiety Depression Hallucinations Homicidal ideation Medication stabilization Suicidal ideation Withdrawal symptoms  Estimated Length of Stay: 1-7 days  Last 3 [place] Suicide Severity Risk Score: Flowsheet Row Admission (Current) from 09/11/2021 in [facility] INPATIENT BEHAVIORAL MEDICINE  C-SSRS RISK CATEGORY High Risk       Last PHQ 2/9 Scores:     No data to display          Scribe for Treatment Team: Glenis Smoker, [place] 09/13/2021 4:35 PM

## 2021-09-13 NOTE — Progress Notes (Signed)
Catherine Mitchell has been better. She has been out more walking in the hall and hanging in the dayroom.  She continues to endorse depression and anxiety, but states that she starting to feel better.  She is requesting Tylenol at this encounter for slight headache that she currently has.  She also received PRN medication to help with sleep tonight.  She continues to endorse passive si without a plan.  She has contracted for safety while here.  Will continue to monitor with q 15 minute safety checks.      C Butler-Nicholson, LPN

## 2021-09-13 NOTE — Plan of Care (Signed)
  Problem: Education: Goal: Knowledge of General Education information will improve Description: Including pain rating scale, medication(s)/side effects and non-pharmacologic comfort measures Outcome: Progressing   Problem: Health Behavior/Discharge Planning: Goal: Ability to manage health-related needs will improve Outcome: Progressing   Problem: Clinical Measurements: Goal: Ability to maintain clinical measurements within normal limits will improve Outcome: Progressing Goal: Will remain free from infection Outcome: Progressing Goal: Diagnostic test results will improve Outcome: Progressing Goal: Respiratory complications will improve Outcome: Progressing Goal: Cardiovascular complication will be avoided Outcome: Progressing   Problem: Self-Concept: Goal: Ability to identify factors that promote anxiety will improve Outcome: Progressing Goal: Level of anxiety will decrease Outcome: Progressing Goal: Ability to modify response to factors that promote anxiety will improve Outcome: Progressing   Problem: Coping: Goal: Ability to identify and develop effective coping behavior will improve Outcome: Progressing   Problem: Education: Goal: Ability to state activities that reduce stress will improve Outcome: Progressing   

## 2021-09-13 NOTE — Group Note (Signed)
[facility] LCSW Group Therapy Note   Group Date: 09/13/2021 Start Time: 1300 End Time: 1400  Type of Therapy and Topic:  Group Therapy:  Feelings around Relapse and Recovery  Participation Level:  Did Not Attend    Description of Group:    Patients in this group will discuss emotions they experience before and after a relapse. They will process how experiencing these feelings, or avoidance of experiencing them, relates to having a relapse. Facilitator will guide patients to explore emotions they have related to recovery. Patients will be encouraged to process which emotions are more powerful. They will be guided to discuss the emotional reaction significant others in their lives may have to patients' relapse or recovery. Patients will be assisted in exploring ways to respond to the emotions of others without this contributing to a relapse.  Therapeutic Goals: Patient will identify two or more emotions that lead to relapse for them:  Patient will identify two emotions that result when they relapse:  Patient will identify two emotions related to recovery:  Patient will demonstrate ability to communicate their needs through discussion and/or role plays.   Summary of Patient Progress: Pt did not attend group despite invitation from CSW.   Therapeutic Modalities:   Cognitive Behavioral Therapy Solution-Focused Therapy Assertiveness Training Relapse Prevention Therapy   Tate Jerkins R Arrie Borrelli, LCSW 

## 2021-09-13 NOTE — [facility] Suicide Risk Assessment (Signed)
[facility] INPATIENT:  Family/Significant Other Suicide Prevention Education  Suicide Prevention Education:  Education Completed; Donye Gfeller/mother [phone]), has been identified by the patient as the family member/significant other with whom the patient will be residing, and identified as the person(s) who will aid the patient in the event of a mental health crisis (suicidal ideations/suicide attempt).  With written consent from the patient, the family member/significant other has been provided the following suicide prevention education, prior to the and/or following the discharge of the patient.  The suicide prevention education provided includes the following: Suicide risk factors Suicide prevention and interventions National Suicide Hotline telephone number [facility] assessment telephone number [facility] Emergency Assistance 911 [facility] and/or Residential Mobile Crisis Unit telephone number  Request made of family/significant other to: Remove weapons (e.g., guns, rifles, knives), all items previously/currently identified as safety concern.   Remove drugs/medications (over-the-counter, prescriptions, illicit drugs), all items previously/currently identified as a safety concern.  The family member/significant other verbalizes understanding of the suicide prevention education information provided.  The family member/significant other agrees to remove the items of safety concern listed above.  Friel shared that this has been a terrible year for her daughter due to things like not being on medications, getting into trouble, and issues with the police. She stated that she feels her daughter is a danger to herself and others currently in the state that she is in. She denied any access to weapons in the home.   Glenis Smoker 09/13/2021, 4:48 PM

## 2021-09-14 DIAGNOSIS — F332 Major depressive disorder, recurrent severe without psychotic features: Secondary | ICD-10-CM | POA: Diagnosis not present

## 2021-09-14 MED ORDER — IBUPROFEN 600 MG PO TABS
600.0000 mg | ORAL_TABLET | Freq: Three times a day (TID) | ORAL | Status: DC | PRN
  Administered 2021-09-14 – 2021-09-20 (×13): 600 mg via ORAL
  Filled 2021-09-14 (×14): qty 1

## 2021-09-14 MED ORDER — LOPERAMIDE HCL 2 MG PO CAPS
2.0000 mg | ORAL_CAPSULE | ORAL | Status: DC | PRN
Start: 2021-09-14 — End: 2021-09-20
  Administered 2021-09-14 – 2021-09-15 (×3): 2 mg via ORAL
  Filled 2021-09-14 (×3): qty 1

## 2021-09-14 NOTE — Group Note (Signed)
[facility] LCSW Group Therapy Note   Group Date: 09/14/2021 Start Time: 0950 End Time: 1050   Type of Therapy and Topic: Group Therapy: Avoiding Self-Sabotaging and Enabling Behaviors  Participation Level: Did Not Attend   Description of Group:  In this group, patients will learn how to identify obstacles, self-sabotaging and enabling behaviors, as well as: what are they, why do we do them and what needs these behaviors meet. Discuss unhealthy relationships and how to have positive healthy boundaries with those that sabotage and enable. Explore aspects of self-sabotage and enabling in yourself and how to limit these self-destructive behaviors in everyday life.   Therapeutic Goals: 1. Patient will identify one obstacle that relates to self-sabotage and enabling behaviors 2. Patient will identify one personal self-sabotaging or enabling behavior they did prior to admission 3. Patient will state a plan to change the above identified behavior 4. Patient will demonstrate ability to communicate their needs through discussion and/or role play.    Summary of Patient Progress: X   Therapeutic Modalities:  Cognitive Behavioral Therapy Person-Centered Therapy Motivational Interviewing    Shareece Bultman R Tashyra Adduci, LCSW 

## 2021-09-14 NOTE — Progress Notes (Signed)
   09/14/21 0000  Psych Admission Type (Psych Patients Only)  Admission Status Involuntary  Psychosocial Assessment  Patient Complaints Anxiety;Depression;Hopelessness  Eye Contact Fair  Facial Expression Anxious;Worried  Affect Anxious  Surveyor, minerals Activity Slow  Appearance/Hygiene Unremarkable  Behavior Characteristics Cooperative;Appropriate to situation  Mood Depressed;Anxious;Pleasant  Aggressive Behavior  Effect No apparent injury  Thought Process  Coherency WDL  Content WDL  Delusions None reported or observed  Perception Hallucinations  Hallucination Command;Visual  Judgment Limited  Confusion None  Danger to Self  Current suicidal ideation? Passive  Self-Injurious Behavior  (NO PLAN)  Agreement Not to Harm Self Yes  Description of Agreement Verbal  Danger to Others  Danger to Others None reported or observed

## 2021-09-14 NOTE — Plan of Care (Signed)
D- Patient alert and oriented. Patient presented in an anxious, but pleasant mood on assessment stating that she slept "good" last night and had continued complaints of pain and diarrhea. Patient rated her left hip pain a "10/10", in which she did request PRN medication from this Clinical research associate. Patient continues to endorse SI, HI, AVH, stating "I've been trying to figure out how to hang myself, but I can't here". Patient also stated that she wants to hurt "others in general", however, she contracts for safety. Patient states that the "walls are closing in on me". Patient continues to endorse both depression and anxiety, although she reports that her anxiety has "gotten better". Patient's goal for today is "getting my medicine in me so it has time to get in my system. Self-esteem, depression, not wanting to hurt myself and others; anxiety", in which she will "work on trying to build coping skills", in order to achieve her goal.   A- Scheduled medications administered to patient, per MD orders. Support and encouragement provided.  Routine safety checks conducted every 15 minutes.  Patient informed to notify staff with problems or concerns.  R- No adverse drug reactions noted. Patient contracts for safety at this time. Patient compliant with medications. Patient receptive, calm, and cooperative. Patient interacts well with others on the unit. Patient remains safe at this time.  Problem: Education: Goal: Knowledge of General Education information will improve Description: Including pain rating scale, medication(s)/side effects and non-pharmacologic comfort measures Outcome: Not Progressing   Problem: Health Behavior/Discharge Planning: Goal: Ability to manage health-related needs will improve Outcome: Not Progressing   Problem: Clinical Measurements: Goal: Ability to maintain clinical measurements within normal limits will improve Outcome: Not Progressing Goal: Will remain free from infection Outcome: Not  Progressing Goal: Diagnostic test results will improve Outcome: Not Progressing Goal: Respiratory complications will improve Outcome: Not Progressing Goal: Cardiovascular complication will be avoided Outcome: Not Progressing   Problem: Activity: Goal: Risk for activity intolerance will decrease Outcome: Not Progressing   Problem: Nutrition: Goal: Adequate nutrition will be maintained Outcome: Not Progressing   Problem: Coping: Goal: Level of anxiety will decrease Outcome: Not Progressing   Problem: Elimination: Goal: Will not experience complications related to bowel motility Outcome: Not Progressing Goal: Will not experience complications related to urinary retention Outcome: Not Progressing   Problem: Pain Managment: Goal: General experience of comfort will improve Outcome: Not Progressing   Problem: Safety: Goal: Ability to remain free from injury will improve Outcome: Not Progressing   Problem: Skin Integrity: Goal: Risk for impaired skin integrity will decrease Outcome: Not Progressing   Problem: Education: Goal: Knowledge of [place] [facility] Education information/materials will improve Outcome: Not Progressing Goal: Emotional status will improve Outcome: Not Progressing Goal: Mental status will improve Outcome: Not Progressing Goal: Verbalization of understanding the information provided will improve Outcome: Not Progressing   Problem: Activity: Goal: Interest or engagement in activities will improve Outcome: Not Progressing Goal: Sleeping patterns will improve Outcome: Not Progressing   Problem: Coping: Goal: Ability to verbalize frustrations and anger appropriately will improve Outcome: Not Progressing Goal: Ability to demonstrate self-control will improve Outcome: Not Progressing   Problem: Health Behavior/Discharge Planning: Goal: Identification of resources available to assist in meeting health care needs will improve Outcome: Not  Progressing Goal: Compliance with treatment plan for underlying cause of condition will improve Outcome: Not Progressing   Problem: Physical Regulation: Goal: Ability to maintain clinical measurements within normal limits will improve Outcome: Not Progressing   Problem: Safety: Goal: Periods  of time without injury will increase Outcome: Not Progressing   Problem: Education: Goal: Utilization of techniques to improve thought processes will improve Outcome: Not Progressing Goal: Knowledge of the prescribed therapeutic regimen will improve Outcome: Not Progressing   Problem: Activity: Goal: Interest or engagement in leisure activities will improve Outcome: Not Progressing Goal: Imbalance in normal sleep/wake cycle will improve Outcome: Not Progressing   Problem: Coping: Goal: Coping ability will improve Outcome: Not Progressing Goal: Will verbalize feelings Outcome: Not Progressing   Problem: Health Behavior/Discharge Planning: Goal: Ability to make decisions will improve Outcome: Not Progressing Goal: Compliance with therapeutic regimen will improve Outcome: Not Progressing   Problem: Role Relationship: Goal: Will demonstrate positive changes in social behaviors and relationships Outcome: Not Progressing   Problem: Safety: Goal: Ability to disclose and discuss suicidal ideas will improve Outcome: Not Progressing Goal: Ability to identify and utilize support systems that promote safety will improve Outcome: Not Progressing   Problem: Self-Concept: Goal: Will verbalize positive feelings about self Outcome: Not Progressing Goal: Level of anxiety will decrease Outcome: Not Progressing   Problem: Education: Goal: Ability to state activities that reduce stress will improve Outcome: Not Progressing   Problem: Coping: Goal: Ability to identify and develop effective coping behavior will improve Outcome: Not Progressing   Problem: Self-Concept: Goal: Ability to  identify factors that promote anxiety will improve Outcome: Not Progressing Goal: Level of anxiety will decrease Outcome: Not Progressing Goal: Ability to modify response to factors that promote anxiety will improve Outcome: Not Progressing

## 2021-09-14 NOTE — Progress Notes (Signed)
[facility] MD Progress Note  09/14/2021 12:36 PM Catherine Mitchell  MRN:  [id number]  Principal Problem: Severe recurrent major depression without psychotic features (HCC) Diagnosis: Principal Problem:   Severe recurrent major depression without psychotic features (HCC) Active Problems:   Opioid use disorder   PTSD (post-traumatic stress disorder)  Patient is a  36y.o. female who presents to the [facility] unit due to depression, anxiety and substance use disorder.   Interval History Patient was seen today for re-evaluation.  Nursing reports no events overnight. The patient has no issues with performing ADLs.  Patient has been medication compliant.    Subjective:  On assessment patient reports two physical complaints - diarrhea and leg pain. PRN medications ordered for both. Patient continues to report feeling depressed, anxious and still suicidal, although states she is safe from harming self here in the hospital. Things her medications are working well for her; thinks they need more time to be more effective. Denies homicidal ideations. Denies auditory/visual hallucinations. The patient reports no side effects from medications.    Labs: no new results for review.  Total Time spent with patient: 20 minutes  Past Psychiatric History: see H&P  Past Medical History: History reviewed. No pertinent past medical history. History reviewed. No pertinent surgical history. Family History: History reviewed. No pertinent family history. Family Psychiatric  History:  Social History: Social History   Substance and Sexual Activity  Alcohol Use Not Currently     Social History   Substance and Sexual Activity  Drug Use Yes   Types: Marijuana    Social History   Socioeconomic History   Marital status: Single    Spouse name: Not on file   Number of children: Not on file   Years of education: Not on file   Highest education level: Not on file  Occupational History   Not on file  Tobacco Use    Smoking status: Never   Smokeless tobacco: Current  Substance and Sexual Activity   Alcohol use: Not Currently   Drug use: Yes    Types: Marijuana   Sexual activity: Not Currently  Other Topics Concern   Not on file  Social History Narrative   Not on file   Social Determinants of Health   Financial Resource Strain: Not on file  Food Insecurity: Not on file  Transportation Needs: Not on file  Physical Activity: Not on file  Stress: Not on file  Social Connections: Not on file   Additional Social History:                         Sleep: Fair  Appetite:  Good  Current Medications: Current Facility-Administered Medications  Medication Dose Route Frequency Provider Last Rate Last Admin   acetaminophen (TYLENOL) tablet 650 mg  650 mg Oral Q6H PRN Clapacs, John T, MD   650 mg at 09/14/21 0807   alum & mag hydroxide-simeth (MAALOX/MYLANTA) 200-200-20 MG/5ML suspension 30 mL  30 mL Oral Q4H PRN Clapacs, John T, MD       buprenorphine-naloxone (SUBOXONE) 8-2 mg per SL tablet 1 tablet  1 tablet Sublingual TID Clapacs, [name], MD   1 tablet at 09/14/21 1225   carbamazepine (TEGRETOL) tablet 200 mg  200 mg Oral BID Clapacs, John T, MD   200 mg at 09/14/21 7829   clonazePAM (KLONOPIN) tablet 0.5 mg  0.5 mg Oral TID Clapacs, John T, MD   0.5 mg at 09/14/21 1225   gabapentin (NEURONTIN) capsule  600 mg  600 mg Oral Q8H PRN Clapacs, [name], MD   600 mg at 09/14/21 0931   hydrOXYzine (ATARAX) tablet 50 mg  50 mg Oral TID PRN Clapacs, [name], MD   50 mg at 09/14/21 0931   ibuprofen (ADVIL) tablet 600 mg  600 mg Oral TID PRN [name], MD   600 mg at 09/14/21 1018   loperamide (IMODIUM) capsule 2 mg  2 mg Oral PRN [name], MD   2 mg at 09/14/21 1018   magnesium hydroxide (MILK OF MAGNESIA) suspension 30 mL  30 mL Oral Daily PRN Clapacs, [name], MD       traZODone (DESYREL) tablet 100 mg  100 mg Oral QHS PRN Clapacs, John T, MD   100 mg at 09/13/21 2115   venlafaxine XR  (EFFEXOR-XR) 24 hr capsule 225 mg  225 mg Oral Q breakfast Clapacs, [name], MD   225 mg at 09/14/21 9628    Lab Results: No results found for this or any previous visit (from the past 48 hour(s)).  Blood Alcohol level:  Lab Results  Component Value Date   ETH <10 09/10/2021    Metabolic Disorder Labs: No results found for: "HGBA1C", "MPG" No results found for: "PROLACTIN" No results found for: "CHOL", "TRIG", "HDL", "CHOLHDL", "VLDL", "LDLCALC"  Physical Findings: AIMS:  , ,  ,  ,    CIWA:    COWS:     Musculoskeletal: Strength & Muscle Tone: within normal limits Gait & Station: normal Patient leans: N/A  Psychiatric Specialty Exam:  Appearance:  CF, appearing stated age, appears well-nourished;  wearing appropriate clothes, with fair grooming and hygiene. Normal level of alertness and appropriate facial expression.  Attitude/Behavior: tearful, calm, cooperative, engaging with appropriate eye contact.  Motor: WNL; dyskinesias not evident. Gait appears in full range.  Speech: spontaneous, clear, coherent, normal comprehension.  Mood: "depressed and anxious ".  Affect: appropriately-reactive, restricted,.  Thought process: patient appears coherent, organized, logical, goal-directed, associations are appropriate.  Thought content: patient reports passive suicidal thoughts, no plans; denies homicidal thoughts; did not express any delusions.  Thought perception: patient denies auditory and visual hallucinations, no illusions, no depersonalizations. Did not appear internally stimulated.  Cognition: patient is alert and oriented in self, place, date; with intact abstract, fund of knowledge, attention and concentration.  Insight: fair, in regards of understanding of presence, nature, cause, and significance of mental or emotional problem.  Judgement: fair, in regards of ability to make good decisions concerning the appropriate thing to do in various situations, including  ability to form opinions regarding their mental health condition.   Physical Exam: Physical Exam ROS Blood pressure 134/75, pulse 66, temperature 98.1 F (36.7 C), temperature source Oral, resp. rate 18, height 5\' 8"  (1.727 m), weight 125.2 kg, SpO2 100 %. Body mass index is 41.97 kg/m.   Treatment Plan Summary: Daily contact with patient to assess and evaluate symptoms and progress in treatment and Medication management  Patient is a 36 year old female with the above-stated past psychiatric history who is seen in follow-up.  Chart reviewed. Patient discussed with nursing. Patient continues to express vague suicidal thoughts, depression, anxiety. Doses of her medications were just adjusted last night, so will continue without changes today and reassess tomorrow.    Plan:  -continue inpatient psych admission; 15-minute checks; daily contact with patient to assess and evaluate symptoms and progress in treatment; psychoeducation.  -continue scheduled medications:  buprenorphine-naloxone  1 tablet Sublingual TID   carbamazepine  200 mg Oral BID   clonazePAM  0.5 mg Oral TID   venlafaxine XR  225 mg Oral Q breakfast    -continue PRN medications.  acetaminophen, alum & mag hydroxide-simeth, gabapentin, hydrOXYzine, ibuprofen, loperamide, magnesium hydroxide, traZODone     -Disposition: Estimated duration of hospitalization: midweek next week. All necessary aftercare will be arranged prior to discharge Likely d/c home with outpatient psych follow-up.   [name], MD 09/14/2021, 12:36 PM

## 2021-09-14 NOTE — Progress Notes (Signed)
Patient became very tearful after speaking to the Child psychotherapist. Patient requested PRN anxiety medication from this writer. Patient tolerated medication administration well, without any issues. Patient remains safe on the unit.

## 2021-09-14 NOTE — Progress Notes (Signed)
Patient reports to this writer, during evening med pass that "this has not been a good day for me, my mind's been racing all day". Patient requested PRN anxiety medication, in which she tolerated it well, without any issues. Patient remains safe on the unit.

## 2021-09-15 DIAGNOSIS — F332 Major depressive disorder, recurrent severe without psychotic features: Secondary | ICD-10-CM | POA: Diagnosis not present

## 2021-09-15 NOTE — Plan of Care (Signed)
D: Pt alert and oriented. Pt rates depression 10/10, hopelessness 10/10, and anxiety 10/10. Pt goal: "Depression, self-esteem, giving my medication time to get in my system." Pt reports energy level as hyper and concentration as being poor. Pt reports sleep last night as being good. Pt did receive medications for sleep and did find them helpful. Pt reports experiencing 10/10 Left hip pain at this time, prn medication given and found helpful. Pt reports experiencing any SI/HI, or AVH at this time. Pt denies having a plan while here. Pt verbally contracts for safety while here. Pt reports voices making negative statements and seeing the walls close in on her.  Pt has been observed in milieu for short periods of time outside of meals. Pt also observed speaking on the phone multiple times.   A: Scheduled medications administered to pt, per MD orders. Support and encouragement provided. Frequent verbal contact made. Routine safety checks conducted q15 minutes.   R: No adverse drug reactions noted. Pt verbally contracts for safety at this time. Pt compliant with medications and treatment plan. Pt interacts minimally with others on the unit. Pt remains safe at this time. Plan of care is ongoing.   Problem: Nutrition: Goal: Adequate nutrition will be maintained Outcome: Progressing   Problem: Education: Goal: Knowledge of the prescribed therapeutic regimen will improve Outcome: Progressing

## 2021-09-15 NOTE — Progress Notes (Signed)
Patient alert and oriented x 4, affect is blunted thoughts are organized no distress noted interacting appropriately with peers and staff. 15 minutes safety checks maintained.

## 2021-09-15 NOTE — Progress Notes (Signed)
[facility] MD Progress Note  09/15/2021 9:39 AM Catherine Mitchell  MRN:  [id number]  Principal Problem: Severe recurrent major depression without psychotic features (HCC) Diagnosis: Principal Problem:   Severe recurrent major depression without psychotic features (HCC) Active Problems:   Opioid use disorder   PTSD (post-traumatic stress disorder)  Patient is a  36y.o. female who presents to the [facility] unit due to depression, anxiety and substance use disorder.   Interval History Patient was seen today for re-evaluation.  Nursing reports no events overnight. The patient has no issues with performing ADLs.  Patient has been medication compliant.    Subjective:  On assessment patient reports she had another episode of diarrhea this morning. Patient continues to report feeling depressed, tearful, unmotivated, anxious and vaguely suicidal, although states she is safe from harming self here in the hospital. Things her medications are working well for her and need more time to show the effect. Denies homicidal ideations. Denies auditory/visual hallucinations. The patient reports no side effects from medications.    Labs: no new results for review.  Total Time spent with patient: 20 minutes  Past Psychiatric History: see H&P  Past Medical History: History reviewed. No pertinent past medical history. History reviewed. No pertinent surgical history. Family History: History reviewed. No pertinent family history. Family Psychiatric  History:  Social History: Social History   Substance and Sexual Activity  Alcohol Use Not Currently     Social History   Substance and Sexual Activity  Drug Use Yes   Types: Marijuana    Social History   Socioeconomic History   Marital status: Single    Spouse name: Not on file   Number of children: Not on file   Years of education: Not on file   Highest education level: Not on file  Occupational History   Not on file  Tobacco Use   Smoking status: Never    Smokeless tobacco: Current  Substance and Sexual Activity   Alcohol use: Not Currently   Drug use: Yes    Types: Marijuana   Sexual activity: Not Currently  Other Topics Concern   Not on file  Social History Narrative   Not on file   Social Determinants of Health   Financial Resource Strain: Not on file  Food Insecurity: Not on file  Transportation Needs: Not on file  Physical Activity: Not on file  Stress: Not on file  Social Connections: Not on file   Additional Social History:                         Sleep: Fair  Appetite:  Good  Current Medications: Current Facility-Administered Medications  Medication Dose Route Frequency Provider Last Rate Last Admin   acetaminophen (TYLENOL) tablet 650 mg  650 mg Oral Q6H PRN Clapacs, John T, MD   650 mg at 09/14/21 0807   alum & mag hydroxide-simeth (MAALOX/MYLANTA) 200-200-20 MG/5ML suspension 30 mL  30 mL Oral Q4H PRN Clapacs, John T, MD       buprenorphine-naloxone (SUBOXONE) 8-2 mg per SL tablet 1 tablet  1 tablet Sublingual TID Clapacs, [name], MD   1 tablet at 09/15/21 4268   carbamazepine (TEGRETOL) tablet 200 mg  200 mg Oral BID Clapacs, John T, MD   200 mg at 09/15/21 3419   clonazePAM (KLONOPIN) tablet 0.5 mg  0.5 mg Oral TID Clapacs, John T, MD   0.5 mg at 09/15/21 0821   gabapentin (NEURONTIN) capsule 600 mg  600  mg Oral Q8H PRN Clapacs, [name], MD   600 mg at 09/14/21 1756   hydrOXYzine (ATARAX) tablet 50 mg  50 mg Oral TID PRN Clapacs, [name], MD   50 mg at 09/14/21 2055   ibuprofen (ADVIL) tablet 600 mg  600 mg Oral TID PRN [name], MD   600 mg at 09/15/21 8416   loperamide (IMODIUM) capsule 2 mg  2 mg Oral PRN [name], MD   2 mg at 09/15/21 6063   magnesium hydroxide (MILK OF MAGNESIA) suspension 30 mL  30 mL Oral Daily PRN Clapacs, [name], MD       traZODone (DESYREL) tablet 100 mg  100 mg Oral QHS PRN Clapacs, John T, MD   100 mg at 09/14/21 2055   venlafaxine XR (EFFEXOR-XR) 24 hr capsule 225 mg   225 mg Oral Q breakfast Clapacs, [name], MD   225 mg at 09/15/21 0160    Lab Results: No results found for this or any previous visit (from the past 48 hour(s)).  Blood Alcohol level:  Lab Results  Component Value Date   ETH <10 09/10/2021    Metabolic Disorder Labs: No results found for: "HGBA1C", "MPG" No results found for: "PROLACTIN" No results found for: "CHOL", "TRIG", "HDL", "CHOLHDL", "VLDL", "LDLCALC"  Physical Findings: AIMS:  , ,  ,  ,    CIWA:    COWS:     Musculoskeletal: Strength & Muscle Tone: within normal limits Gait & Station: normal Patient leans: N/A  Psychiatric Specialty Exam:  Appearance:  CF, appearing stated age, appears well-nourished;  wearing appropriate clothes, with fair grooming and hygiene. Normal level of alertness and appropriate facial expression.  Attitude/Behavior: tearful, calm, cooperative, engaging with appropriate eye contact.  Motor: WNL; dyskinesias not evident. Gait appears in full range.  Speech: spontaneous, clear, coherent, normal comprehension.  Mood: "depressed and anxious ".  Affect: appropriately-reactive, restricted,.  Thought process: patient appears coherent, organized, logical, goal-directed, associations are appropriate.  Thought content: patient reports passive suicidal thoughts, no plans; denies homicidal thoughts; did not express any delusions.  Thought perception: patient denies auditory and visual hallucinations, no illusions, no depersonalizations. Did not appear internally stimulated.  Cognition: patient is alert and oriented in self, place, date; with intact abstract, fund of knowledge, attention and concentration.  Insight: fair, in regards of understanding of presence, nature, cause, and significance of mental or emotional problem.  Judgement: fair, in regards of ability to make good decisions concerning the appropriate thing to do in various situations, including ability to form opinions regarding  their mental health condition.   Physical Exam: Physical Exam ROS Blood pressure (!) 155/93, pulse 76, temperature 98 F (36.7 C), temperature source Oral, resp. rate 18, height 5\' 8"  (1.727 m), weight 125.2 kg, SpO2 100 %. Body mass index is 41.97 kg/m.   Treatment Plan Summary: Daily contact with patient to assess and evaluate symptoms and progress in treatment and Medication management  Patient is a 36 year old female with the above-stated past psychiatric history who is seen in follow-up.  Chart reviewed. Patient discussed with nursing. Patient continues to express vague suicidal thoughts, depression, anxiety. Doses of her medications were adjusted two days ago, so will continue without changes today.    Plan:  -continue inpatient psych admission; 15-minute checks; daily contact with patient to assess and evaluate symptoms and progress in treatment; psychoeducation.  -continue scheduled medications:  buprenorphine-naloxone  1 tablet Sublingual TID   carbamazepine  200 mg Oral BID  clonazePAM  0.5 mg Oral TID   venlafaxine XR  225 mg Oral Q breakfast    -continue PRN medications.  acetaminophen, alum & mag hydroxide-simeth, gabapentin, hydrOXYzine, ibuprofen, loperamide, magnesium hydroxide, traZODone     -Disposition: Estimated duration of hospitalization: midweek next week. All necessary aftercare will be arranged prior to discharge Likely d/c home with outpatient psych follow-up.   [name], MD 09/15/2021, 9:39 AM

## 2021-09-16 DIAGNOSIS — F332 Major depressive disorder, recurrent severe without psychotic features: Principal | ICD-10-CM

## 2021-09-16 NOTE — Progress Notes (Signed)
Recreation Therapy Notes  Date: 09/16/2021  Time: 10:45 am    Location: Courtyard    Behavioral response: N/A   Intervention Topic: Wellness   Discussion/Intervention: Patient refused to attend group.   Clinical Observations/Feedback:  Patient refused to attend group.    Arlinda Barcelona LRT/CTRS        Monnie Gudgel 09/16/2021 12:10 PM        Ziv Welchel 09/16/2021 12:10 PM

## 2021-09-16 NOTE — Progress Notes (Signed)
[facility] MD Progress Note  09/16/2021 3:22 PM Catherine Mitchell  MRN:  [id number] Subjective: Patient seen for follow-up.  36 year old woman with recurrent mental health problems and history of substance abuse.  Patient is continuing to state to all staff members that she is having extreme symptoms.  She complains of hearing voices and having intrusive thoughts of killing herself constantly.  Staff all noticed that this appears to be incongruent with her affect and behavior and she is able to carry on lucid conversations and appears to enjoy visits from family without any difficulty.  She does say she thinks she is "a little better".  Very focused on making sure she has "a letter" for court Principal Problem: Severe recurrent major depression without psychotic features (HCC) Diagnosis: Principal Problem:   Severe recurrent major depression without psychotic features (HCC) Active Problems:   Opioid use disorder   PTSD (post-traumatic stress disorder)  Total Time spent with patient: 30 minutes  Past Psychiatric History: History of recurrent symptoms and substance use with previous suicidal ideation  Past Medical History: History reviewed. No pertinent past medical history. History reviewed. No pertinent surgical history. Family History: History reviewed. No pertinent family history. Family Psychiatric  History: See previous Social History:  Social History   Substance and Sexual Activity  Alcohol Use Not Currently     Social History   Substance and Sexual Activity  Drug Use Yes   Types: Marijuana    Social History   Socioeconomic History   Marital status: Single    Spouse name: Not on file   Number of children: Not on file   Years of education: Not on file   Highest education level: Not on file  Occupational History   Not on file  Tobacco Use   Smoking status: Never   Smokeless tobacco: Current  Substance and Sexual Activity   Alcohol use: Not Currently   Drug use: Yes    Types:  Marijuana   Sexual activity: Not Currently  Other Topics Concern   Not on file  Social History Narrative   Not on file   Social Determinants of Health   Financial Resource Strain: Not on file  Food Insecurity: Not on file  Transportation Needs: Not on file  Physical Activity: Not on file  Stress: Not on file  Social Connections: Not on file   Additional Social History:                         Sleep: Fair  Appetite:  Fair  Current Medications: Current Facility-Administered Medications  Medication Dose Route Frequency Provider Last Rate Last Admin   acetaminophen (TYLENOL) tablet 650 mg  650 mg Oral Q6H PRN Puneet Selden T, MD   650 mg at 09/16/21 1139   alum & mag hydroxide-simeth (MAALOX/MYLANTA) 200-200-20 MG/5ML suspension 30 mL  30 mL Oral Q4H PRN Warner Laduca T, MD       buprenorphine-naloxone (SUBOXONE) 8-2 mg per SL tablet 1 tablet  1 tablet Sublingual TID Karthikeya Funke, [name], MD   1 tablet at 09/16/21 1138   carbamazepine (TEGRETOL) tablet 200 mg  200 mg Oral BID Kortny Lirette T, MD   200 mg at 09/16/21 0754   clonazePAM (KLONOPIN) tablet 0.5 mg  0.5 mg Oral TID Carling Liberman T, MD   0.5 mg at 09/16/21 1138   gabapentin (NEURONTIN) capsule 600 mg  600 mg Oral Q8H PRN Aashritha Miedema, [name], MD   600 mg at 09/16/21 [phone]  hydrOXYzine (ATARAX) tablet 50 mg  50 mg Oral TID PRN Hattie Aguinaldo, [name], MD   50 mg at 09/15/21 2004   ibuprofen (ADVIL) tablet 600 mg  600 mg Oral TID PRN [name], MD   600 mg at 09/16/21 0754   loperamide (IMODIUM) capsule 2 mg  2 mg Oral PRN [name], MD   2 mg at 09/15/21 4401   magnesium hydroxide (MILK OF MAGNESIA) suspension 30 mL  30 mL Oral Daily PRN Sebastian Lurz, [name], MD       traZODone (DESYREL) tablet 100 mg  100 mg Oral QHS PRN Rosealynn Mateus T, MD   100 mg at 09/15/21 2104   venlafaxine XR (EFFEXOR-XR) 24 hr capsule 225 mg  225 mg Oral Q breakfast Braylynn Lewing, [name], MD   225 mg at 09/16/21 0754    Lab Results: No results found for this  or any previous visit (from the past 48 hour(s)).  Blood Alcohol level:  Lab Results  Component Value Date   ETH <10 09/10/2021    Metabolic Disorder Labs: No results found for: "HGBA1C", "MPG" No results found for: "PROLACTIN" No results found for: "CHOL", "TRIG", "HDL", "CHOLHDL", "VLDL", "LDLCALC"  Physical Findings: AIMS:  , ,  ,  ,    CIWA:    COWS:     Musculoskeletal: Strength & Muscle Tone: within normal limits Gait & Station: normal Patient leans: N/A  Psychiatric Specialty Exam:  Presentation  General Appearance: Appropriate for Environment  Eye Contact:Minimal  Speech:Clear and Coherent  Speech Volume:Normal  Handedness:Right   Mood and Affect  Mood:Anxious; Depressed  Affect:Depressed   Thought Process  Thought Processes:Coherent  Descriptions of Associations:Intact  Orientation:Full (Time, Place and Person)  Thought Content:Logical  History of Schizophrenia/Schizoaffective disorder:No data recorded Duration of Psychotic Symptoms:No data recorded Hallucinations:No data recorded Ideas of Reference:None  Suicidal Thoughts:No data recorded Homicidal Thoughts:No data recorded  Sensorium  Memory:Immediate Good; Recent Good; Remote Good  Judgment:Poor  Insight:Poor   Executive Functions  Concentration:Fair  Attention Span:Good  Recall:Good  Fund of Knowledge:Good  Language:Good   Psychomotor Activity  Psychomotor Activity:No data recorded  Assets  Assets:Communication Skills; Desire for Improvement; Social Support   Sleep  Sleep:No data recorded   Physical Exam: Physical Exam Vitals and nursing note reviewed.  Constitutional:      Appearance: Normal appearance.  HENT:     Head: Normocephalic and atraumatic.     Mouth/Throat:     Pharynx: Oropharynx is clear.  Eyes:     Pupils: Pupils are equal, round, and reactive to light.  Cardiovascular:     Rate and Rhythm: Normal rate and regular rhythm.  Pulmonary:      Effort: Pulmonary effort is normal.     Breath sounds: Normal breath sounds.  Abdominal:     General: Abdomen is flat.     Palpations: Abdomen is soft.  Musculoskeletal:        General: Normal range of motion.  Skin:    General: Skin is warm and dry.  Neurological:     General: No focal deficit present.     Mental Status: She is alert. Mental status is at baseline.  Psychiatric:        Attention and Perception: Attention normal.        Mood and Affect: Mood normal. Affect is blunt.        Speech: Speech normal.        Behavior: Behavior is cooperative.  Thought Content: Thought content normal.        Cognition and Memory: Cognition normal.    Review of Systems  Constitutional: Negative.   HENT: Negative.    Eyes: Negative.   Respiratory: Negative.    Cardiovascular: Negative.   Gastrointestinal: Negative.   Musculoskeletal: Negative.   Skin: Negative.   Neurological: Negative.   Psychiatric/Behavioral:  Positive for depression, hallucinations and suicidal ideas. The patient is nervous/anxious.    Blood pressure (!) 137/91, pulse 78, temperature 97.9 F (36.6 C), temperature source Oral, resp. rate 18, height 5\' 8"  (1.727 m), weight 125.2 kg, SpO2 100 %. Body mass index is 41.97 kg/m.   Treatment Plan Summary: Plan no change to medicine as she says the medicine she is currently on is helping her.  Encourage patient to be out of her room attending groups and interacting with others and hope for discharge by sometime this week.  , MD 09/16/2021, 3:22 PM

## 2021-09-16 NOTE — Plan of Care (Signed)
D: Pt alert and oriented. Pt rates depression 10/10, hopelessness 10/10, and anxiety 10/10. Pt goal: Give my medication time to get back in my system, work on self-esteem, anger management , depression, anxiety. Pt reports energy level as hyper and concentration as being poor. Pt reports sleep last night as being fair. Pt did receive medications for sleep and did find them helpful. Pt reports experiencing 8/10 Left hip pain at this time, prn meds given and found helpful. Pt reports experiencing any SI/HI, or AVH at this time. Pt states she has no plan while here and contracts for safety while here. Pt report hearing voices that make negative statements and sees the walls closing in on her.   A: Scheduled medications administered to pt, per MD orders. Support and encouragement provided. Frequent verbal contact made. Routine safety checks conducted q15 minutes.   R: No adverse drug reactions noted. Pt verbally contracts for safety at this time. Pt compliant with medications. Pt interacts well with others on the unit. Pt remains safe at this time. Plan of care ongoing.   Problem: Education: Goal: Knowledge of General Education information will improve Description: Including pain rating scale, medication(s)/side effects and non-pharmacologic comfort measures Outcome: Progressing   Problem: Activity: Goal: Risk for activity intolerance will decrease Outcome: Progressing

## 2021-09-16 NOTE — Group Note (Signed)
[facility] LCSW Group Therapy Note    Group Date: 09/16/2021 Start Time: 1300 End Time: 1400  Type of Therapy and Topic:  Group Therapy:  Overcoming Obstacles  Participation Level:  [facility] PARTICIPATION LEVEL: Did Not Attend   Description of Group:   In this group patients will be encouraged to explore what they see as obstacles to their own wellness and recovery. They will be guided to discuss their thoughts, feelings, and behaviors related to these obstacles. The group will process together ways to cope with barriers, with attention given to specific choices patients can make. Each patient will be challenged to identify changes they are motivated to make in order to overcome their obstacles. This group will be process-oriented, with patients participating in exploration of their own experiences as well as giving and receiving support and challenge from other group members.  Therapeutic Goals: 1. Patient will identify personal and current obstacles as they relate to admission. 2. Patient will identify barriers that currently interfere with their wellness or overcoming obstacles.  3. Patient will identify feelings, thought process and behaviors related to these barriers. 4. Patient will identify two changes they are willing to make to overcome these obstacles:    Summary of Patient Progress X   Therapeutic Modalities:   Cognitive Behavioral Therapy Solution Focused Therapy Motivational Interviewing Relapse Prevention Therapy   Keygan Dumond R Patrese Neal, LCSW 

## 2021-09-16 NOTE — Progress Notes (Signed)
Pt calm and pleasant during assessment endorsing anxiety, depression, AH, passive SI. Pt verbally contracts for safety with this Clinical research associate. Pt observed interacting appropriately with staff and peers on the unit. Pt compliant with medication administration per MD orders. Pt given education, support, and encouragement to be active in her treatment plan. Pt being monitored Q 15 minutes for safety per unit protocol, remains safe on the unit.

## 2021-09-17 DIAGNOSIS — F332 Major depressive disorder, recurrent severe without psychotic features: Secondary | ICD-10-CM | POA: Diagnosis not present

## 2021-09-17 NOTE — Progress Notes (Signed)
[facility] MD Progress Note  09/17/2021 11:19 AM Catherine Mitchell  MRN:  [id number] Subjective: Went to see patient this morning woke her up and asked her how she was feeling today.  She immediately replied "suicidal and homicidal".  Also immediately told me that "the walls are closing in".  All of this without any evidence of any distress in her affect.  Very hard not to see this as being perhaps exaggerated for secondary gain.  Patient told me that she thought she would probably be well enough to go by the end of the week.  Focused once again on the idea of me "writing a letter" to court for her. Principal Problem: Severe recurrent major depression without psychotic features (HCC) Diagnosis: Principal Problem:   Severe recurrent major depression without psychotic features (HCC) Active Problems:   Opioid use disorder   PTSD (post-traumatic stress disorder)  Total Time spent with patient: 30 minutes  Past Psychiatric History: Past history of substance abuse  Past Medical History: History reviewed. No pertinent past medical history. History reviewed. No pertinent surgical history. Family History: History reviewed. No pertinent family history. Family Psychiatric  History: See previous Social History:  Social History   Substance and Sexual Activity  Alcohol Use Not Currently     Social History   Substance and Sexual Activity  Drug Use Yes   Types: Marijuana    Social History   Socioeconomic History   Marital status: Single    Spouse name: Not on file   Number of children: Not on file   Years of education: Not on file   Highest education level: Not on file  Occupational History   Not on file  Tobacco Use   Smoking status: Never   Smokeless tobacco: Current  Substance and Sexual Activity   Alcohol use: Not Currently   Drug use: Yes    Types: Marijuana   Sexual activity: Not Currently  Other Topics Concern   Not on file  Social History Narrative   Not on file   Social  Determinants of Health   Financial Resource Strain: Not on file  Food Insecurity: Not on file  Transportation Needs: Not on file  Physical Activity: Not on file  Stress: Not on file  Social Connections: Not on file   Additional Social History:                         Sleep: Fair  Appetite:  Fair  Current Medications: Current Facility-Administered Medications  Medication Dose Route Frequency Provider Last Rate Last Admin   acetaminophen (TYLENOL) tablet 650 mg  650 mg Oral Q6H PRN Lauralei Clouse T, MD   650 mg at 09/16/21 1139   alum & mag hydroxide-simeth (MAALOX/MYLANTA) 200-200-20 MG/5ML suspension 30 mL  30 mL Oral Q4H PRN Suhana Wilner T, MD       buprenorphine-naloxone (SUBOXONE) 8-2 mg per SL tablet 1 tablet  1 tablet Sublingual TID Haydn Hutsell, [name], MD   1 tablet at 09/17/21 1117   carbamazepine (TEGRETOL) tablet 200 mg  200 mg Oral BID Kaston Faughn T, MD   200 mg at 09/17/21 0801   clonazePAM (KLONOPIN) tablet 0.5 mg  0.5 mg Oral TID Roberta Kelly T, MD   0.5 mg at 09/17/21 1117   gabapentin (NEURONTIN) capsule 600 mg  600 mg Oral Q8H PRN Georgana Romain T, MD   600 mg at 09/17/21 1118   hydrOXYzine (ATARAX) tablet 50 mg  50 mg Oral TID  PRN Leiland Mihelich, [name], MD   50 mg at 09/16/21 2105   ibuprofen (ADVIL) tablet 600 mg  600 mg Oral TID PRN [name], MD   600 mg at 09/17/21 0800   loperamide (IMODIUM) capsule 2 mg  2 mg Oral PRN [name], MD   2 mg at 09/15/21 4656   magnesium hydroxide (MILK OF MAGNESIA) suspension 30 mL  30 mL Oral Daily PRN Sitlaly Gudiel, [name], MD       traZODone (DESYREL) tablet 100 mg  100 mg Oral QHS PRN Shaddai Shapley T, MD   100 mg at 09/16/21 2105   venlafaxine XR (EFFEXOR-XR) 24 hr capsule 225 mg  225 mg Oral Q breakfast Lilliauna Van T, MD   225 mg at 09/17/21 0800    Lab Results: No results found for this or any previous visit (from the past 48 hour(s)).  Blood Alcohol level:  Lab Results  Component Value Date   ETH <10 09/10/2021     Metabolic Disorder Labs: No results found for: "HGBA1C", "MPG" No results found for: "PROLACTIN" No results found for: "CHOL", "TRIG", "HDL", "CHOLHDL", "VLDL", "LDLCALC"  Physical Findings: AIMS:  , ,  ,  ,    CIWA:    COWS:     Musculoskeletal: Strength & Muscle Tone: within normal limits Gait & Station: normal Patient leans: N/A  Psychiatric Specialty Exam:  Presentation  General Appearance: Appropriate for Environment  Eye Contact:Minimal  Speech:Clear and Coherent  Speech Volume:Normal  Handedness:Right   Mood and Affect  Mood:Anxious; Depressed  Affect:Depressed   Thought Process  Thought Processes:Coherent  Descriptions of Associations:Intact  Orientation:Full (Time, Place and Person)  Thought Content:Logical  History of Schizophrenia/Schizoaffective disorder:No data recorded Duration of Psychotic Symptoms:No data recorded Hallucinations:No data recorded Ideas of Reference:None  Suicidal Thoughts:No data recorded Homicidal Thoughts:No data recorded  Sensorium  Memory:Immediate Good; Recent Good; Remote Good  Judgment:Poor  Insight:Poor   Executive Functions  Concentration:Fair  Attention Span:Good  Recall:Good  Fund of Knowledge:Good  Language:Good   Psychomotor Activity  Psychomotor Activity:No data recorded  Assets  Assets:Communication Skills; Desire for Improvement; Social Support   Sleep  Sleep:No data recorded   Physical Exam: Physical Exam Vitals and nursing note reviewed.  Constitutional:      Appearance: Normal appearance.  HENT:     Head: Normocephalic and atraumatic.     Mouth/Throat:     Pharynx: Oropharynx is clear.  Eyes:     Pupils: Pupils are equal, round, and reactive to light.  Cardiovascular:     Rate and Rhythm: Normal rate and regular rhythm.  Pulmonary:     Effort: Pulmonary effort is normal.     Breath sounds: Normal breath sounds.  Abdominal:     General: Abdomen is flat.      Palpations: Abdomen is soft.  Musculoskeletal:        General: Normal range of motion.  Skin:    General: Skin is warm and dry.  Neurological:     General: No focal deficit present.     Mental Status: She is alert. Mental status is at baseline.  Psychiatric:        Attention and Perception: Attention normal.        Mood and Affect: Mood is depressed.        Speech: Speech normal.        Behavior: Behavior is cooperative.        Thought Content: Thought content normal.  Cognition and Memory: Cognition normal.    Review of Systems  Constitutional: Negative.   HENT: Negative.    Eyes: Negative.   Respiratory: Negative.    Cardiovascular: Negative.   Gastrointestinal: Negative.   Musculoskeletal: Negative.   Skin: Negative.   Neurological: Negative.   Psychiatric/Behavioral:  Positive for hallucinations and suicidal ideas. The patient is nervous/anxious.    Blood pressure 125/79, pulse 68, temperature 97.8 F (36.6 C), temperature source Oral, resp. rate 18, height 5\' 8"  (1.727 m), weight 125.2 kg, SpO2 100 %. Body mass index is 41.97 kg/m.   Treatment Plan Summary: Medication management and Plan no change to medication.  Patient agrees with that.  Encouraged her to be up out of bed and interacting with others.  I did not quite tell her that I do not believe her about being "suicidal and homicidal but the way she talks about it really suggest that this is an unrealistic complaint.  Continue monitoring with anticipation of probably being able to discharge within a couple days  , MD 09/17/2021, 11:19 AM

## 2021-09-17 NOTE — Progress Notes (Signed)
Recreation Therapy Notes   Date: 09/17/2021   Time: 10:25 am     Location: Craft room   Behavioral response: Appropriate  Intervention Topic:  Time Management   Discussion/Intervention:  Group content today was focused on time management. The group defined time management and identified healthy ways to manage time. Individuals expressed how much of the 24 hours they use in a day. Patients expressed how much time they use just for themselves personally. The group expressed how they have managed their time in the past. Individuals participated in the intervention "Managing Life" where they had a chance to see how much of the 24 hours they use and where it goes. Clinical Observations/Feedback: Patient came to group and defined time management as scheduling to get things done. She stated that she set alarms and reminders to get things done on time. Individual was social with peers and staff while participating in the intervention.  Meeah Totino LRT/CTRS        Hendy Brindle 09/17/2021 11:51 AM

## 2021-09-17 NOTE — Progress Notes (Signed)
Pt calm and pleasant during assessment endorsing anxiety, depression, AH, passive SI. Pt verbally contracts for safety with this writer. Pt observed interacting appropriately with staff and peers on the unit. Pt compliant with medication administration per MD orders. Pt given education, support, and encouragement to be active in her treatment plan. Pt being monitored Q 15 minutes for safety per unit protocol, remains safe on the unit.  

## 2021-09-17 NOTE — Plan of Care (Signed)
D: Pt alert and oriented. Pt rates depression 10/10, hopelessness 10/10, and anxiety 7/10. Pt goal: Depression, coping skills, anger management, anxiety. Pt reports energy level as low and concentration as being poor. Pt reports sleep last night as being good. Pt did receive medications for sleep and did find them helpful. Pt reports experiencing 4/10 Left hip pain at this time, prn meds given. Pt reports experiencing any SI/HI, or AVH at this time. Pt denies having a plan while here. Pt contracts for safety while here. Pt states she sees the walls closing in and the voices are saying negative things to her.  Pt attended recreational therapy group this morning.  A: Scheduled medications administered to pt, per MD orders. Support and encouragement provided. Frequent verbal contact made. Routine safety checks conducted q15 minutes.   R: No adverse drug reactions noted. Pt verbally contracts for safety at this time. Pt compliant with medications and treatment plan. Pt interacts well with others on the unit. Pt remains safe at this time. Plan of care ongoing.   Problem: Education: Goal: Knowledge of General Education information will improve Description: Including pain rating scale, medication(s)/side effects and non-pharmacologic comfort measures Outcome: Progressing   Problem: Pain Managment: Goal: General experience of comfort will improve Outcome: Progressing

## 2021-09-18 DIAGNOSIS — F332 Major depressive disorder, recurrent severe without psychotic features: Secondary | ICD-10-CM | POA: Diagnosis not present

## 2021-09-18 NOTE — Plan of Care (Signed)
Patient states that her anxiety and depression " high " because she did not have good visitation with her mom yesterday. Patient states she could not sleep well last night.Patient verbalized passive SI and AVH. Patient contracts for safety in the hospital.Pt states she sees the walls closing in and the voices are saying negative things to her. Patient attended group outside today.  Patient states her boy friend will be visiting today,he is supportive. Support and encouragement given.

## 2021-09-18 NOTE — Progress Notes (Signed)
Recreation Therapy Notes   Date: 09/18/2021   Time: 10:50 am     Location: Courtyard    Behavioral response: Appropriate  Intervention Topic:  Leisure    Discussion/Intervention:  Group content today was focused on leisure. The group defined what leisure is and some positive leisure activities they participate in. Individuals identified the difference between good and bad leisure. Participants expressed how they feel after participating in the leisure of their choice. The group discussed how they go about picking a leisure activity and if others are involved in their leisure activities. The patient stated how many leisure activities they have to choose from and reasons why it is important to have leisure time. Individuals participated in the intervention "Exploration of Leisure" where they had a chance to identify new leisure activities as well as benefits of leisure. Clinical Observations/Feedback: Patient came to group and was able to identify leisure activities that they participate in outside of the hospital. Individual was social with peers and staff while participating in the intervention.  Ilyanna Baillargeon LRT/CTRS        Verleen Stuckey 09/18/2021 1:56 PM

## 2021-09-18 NOTE — BHH Counselor (Signed)
CSW spoke with pt briefly, per request. She asked for suboxone resources. CSW stated that she would be given these. No other concerns expressed. Contact ended without incident.   CSW gave pt suboxone provider resource list to look over. She stated that she would start calling also. CSW informed her to let team know if there was anything that they could do to help because they can also make care about this. She agreed, stating that she would look over the list and circle some. No other concerns expressed. Contact ended without incident.   Vilma Meckel. Algis Greenhouse, MSW, LCSW, LCAS 09/18/2021 3:37 PM
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
## 2021-09-18 NOTE — BH IP Treatment Plan (Signed)
Interdisciplinary Treatment and Diagnostic Plan Update  09/18/2021 Time of Session: 8:30AM Catherine Mitchell MRN: [id number]  Principal Diagnosis: Severe recurrent major depression without psychotic features [facility])  Secondary Diagnoses: Principal Problem:   Severe recurrent major depression without psychotic features (HCC) Active Problems:   Opioid use disorder   PTSD (post-traumatic stress disorder)   Current Medications:  Current Facility-Administered Medications  Medication Dose Route Frequency Provider Last Rate Last Admin   acetaminophen (TYLENOL) tablet 650 mg  650 mg Oral Q6H PRN Clapacs, John T, MD   650 mg at 09/16/21 1139   alum & mag hydroxide-simeth (MAALOX/MYLANTA) 200-200-20 MG/5ML suspension 30 mL  30 mL Oral Q4H PRN Clapacs, John T, MD       buprenorphine-naloxone (SUBOXONE) 8-2 mg per SL tablet 1 tablet  1 tablet Sublingual TID Clapacs, [name], MD   1 tablet at 09/18/21 0802   carbamazepine (TEGRETOL) tablet 200 mg  200 mg Oral BID Clapacs, John T, MD   200 mg at 09/18/21 0802   clonazePAM (KLONOPIN) tablet 0.5 mg  0.5 mg Oral TID Clapacs, John T, MD   0.5 mg at 09/18/21 0802   gabapentin (NEURONTIN) capsule 600 mg  600 mg Oral Q8H PRN Clapacs, [name], MD   600 mg at 09/17/21 1957   hydrOXYzine (ATARAX) tablet 50 mg  50 mg Oral TID PRN Clapacs, [name], MD   50 mg at 09/17/21 1957   ibuprofen (ADVIL) tablet 600 mg  600 mg Oral TID PRN [name], MD   600 mg at 09/18/21 0827   loperamide (IMODIUM) capsule 2 mg  2 mg Oral PRN [name], MD   2 mg at 09/15/21 4888   magnesium hydroxide (MILK OF MAGNESIA) suspension 30 mL  30 mL Oral Daily PRN Clapacs, [name], MD       traZODone (DESYREL) tablet 100 mg  100 mg Oral QHS PRN Clapacs, John T, MD   100 mg at 09/17/21 2105   venlafaxine XR (EFFEXOR-XR) 24 hr capsule 225 mg  225 mg Oral Q breakfast Clapacs, [name], MD   225 mg at 09/18/21 0801   PTA Medications: No medications prior to admission.    Patient Stressors: Legal  issue   Substance abuse    Patient Strengths: Motivation for treatment/growth  Physical Health   Treatment Modalities: Medication Management, Group therapy, Case management,  1 to 1 session with clinician, Psychoeducation, Recreational therapy.   Physician Treatment Plan for Primary Diagnosis: Severe recurrent major depression without psychotic features (HCC) Long Term Goal(s): Improvement in symptoms so as ready for discharge   Short Term Goals: Ability to demonstrate self-control will improve Compliance with prescribed medications will improve Ability to verbalize feelings will improve Ability to disclose and discuss suicidal ideas  Medication Management: Evaluate patient's response, side effects, and tolerance of medication regimen.  Therapeutic Interventions: 1 to 1 sessions, Unit Group sessions and Medication administration.  Evaluation of Outcomes: Progressing  Physician Treatment Plan for Secondary Diagnosis: Principal Problem:   Severe recurrent major depression without psychotic features (HCC) Active Problems:   Opioid use disorder   PTSD (post-traumatic stress disorder)  Long Term Goal(s): Improvement in symptoms so as ready for discharge   Short Term Goals: Ability to demonstrate self-control will improve Compliance with prescribed medications will improve Ability to verbalize feelings will improve Ability to disclose and discuss suicidal ideas     Medication Management: Evaluate patient's response, side effects, and tolerance of medication regimen.  Therapeutic Interventions: 1 to 1  sessions, Unit Group sessions and Medication administration.  Evaluation of Outcomes: Progressing   RN Treatment Plan for Primary Diagnosis: Severe recurrent major depression without psychotic features (HCC) Long Term Goal(s): Knowledge of disease and therapeutic regimen to maintain health will improve  Short Term Goals: Ability to remain free from injury will improve, Ability to  verbalize frustration and anger appropriately will improve, Ability to demonstrate self-control, Ability to participate in decision making will improve, Ability to verbalize feelings will improve, Ability to disclose and discuss suicidal ideas, Ability to identify and develop effective coping behaviors will improve, and Compliance with prescribed medications will improve  Medication Management: RN will administer medications as ordered by provider, will assess and evaluate patient's response and provide education to patient for prescribed medication. RN will report any adverse and/or side effects to prescribing provider.  Therapeutic Interventions: 1 on 1 counseling sessions, Psychoeducation, Medication administration, Evaluate responses to treatment, Monitor vital signs and CBGs as ordered, Perform/monitor CIWA, COWS, AIMS and Fall Risk screenings as ordered, Perform wound care treatments as ordered.  Evaluation of Outcomes: Progressing   LCSW Treatment Plan for Primary Diagnosis: Severe recurrent major depression without psychotic features (HCC) Long Term Goal(s): Safe transition to appropriate next level of care at discharge, Engage patient in therapeutic group addressing interpersonal concerns.  Short Term Goals: Engage patient in aftercare planning with referrals and resources, Increase social support, Increase ability to appropriately verbalize feelings, Increase emotional regulation, Facilitate acceptance of mental health diagnosis and concerns, Facilitate patient progression through stages of change regarding substance use diagnoses and concerns, Identify triggers associated with mental health/substance abuse issues, and Increase skills for wellness and recovery  Therapeutic Interventions: Assess for all discharge needs, 1 to 1 time with Social worker, Explore available resources and support systems, Assess for adequacy in community support network, Educate family and significant other(s) on  suicide prevention, Complete Psychosocial Assessment, Interpersonal group therapy.  Evaluation of Outcomes: Progressing   Progress in Treatment: Attending groups: No. Participating in groups: No. Taking medication as prescribed: Yes. Toleration medication: Yes. Family/Significant other contact made: Yes, individual(s) contacted:  SPE completed with mother. Patient understands diagnosis: Yes. Discussing patient identified problems/goals with staff: Yes. Medical problems stabilized or resolved: Yes. Denies suicidal/homicidal ideation: No. Issues/concerns per patient self-inventory: No. Other: none.  New problem(s) identified: No, Describe:  none identified. Update 09/18/21: No changes at this time.   New Short Term/Long Term Goal(s): detox, elimination of symptoms of psychosis, medication management for mood stabilization; elimination of SI thoughts; development of comprehensive mental wellness/sobriety plan. Update 09/18/21: No changes at this time.   Patient Goals:  "I want to get to where I don't feel so hopeless. Would like to get some hope back os that I don't feel like killing myself. My self-esteem. My depression is real bad right now, I'd like to see it get better and the voices." Update 09/18/21: No changes at this time.   Discharge Plan or Barriers: CSW will assist pt with development of an appropriate aftercare/discharge plan. Update 09/18/21: No changes at this time.   Reason for Continuation of Hospitalization: Anxiety Depression Hallucinations Homicidal ideation Medication stabilization Suicidal ideation Withdrawal symptoms   Estimated Length of Stay: 1-7 days Update 09/18/21: No changes at this time.  Last 3 [place] Suicide Severity Risk Score: Flowsheet Row Admission (Current) from 09/11/2021 in [facility]INE  C-SSRS RISK CATEGORY No Risk       Last PHQ 2/9 Scores:     No data to display  Scribe for Treatment Team: Glenis Smoker, [place] 09/18/2021 8:47 AM

## 2021-09-18 NOTE — Progress Notes (Signed)
[facility] MD Progress Note  09/18/2021 4:36 PM Catherine Mitchell  MRN:  [id number] Subjective: Follow-up for this 36 year old woman.  Patient continues to describe herself as "very suicidal" and talk about putting nooses in trees while at the same time talking about plans for the future that she is discussing with her boyfriend.  Affect seems euthymic.  No behavior problems on the unit.  No tearfulness and no agitation and no sign of psychosis Principal Problem: Severe recurrent major depression without psychotic features (HCC) Diagnosis: Principal Problem:   Severe recurrent major depression without psychotic features (HCC) Active Problems:   Opioid use disorder   PTSD (post-traumatic stress disorder)  Total Time spent with patient: 30 minutes  Past Psychiatric History: Past history of mood problems behavior problems and substance abuse  Past Medical History: History reviewed. No pertinent past medical history. History reviewed. No pertinent surgical history. Family History: History reviewed. No pertinent family history. Family Psychiatric  History: See previous Social History:  Social History   Substance and Sexual Activity  Alcohol Use Not Currently     Social History   Substance and Sexual Activity  Drug Use Yes   Types: Marijuana    Social History   Socioeconomic History   Marital status: Single    Spouse name: Not on file   Number of children: Not on file   Years of education: Not on file   Highest education level: Not on file  Occupational History   Not on file  Tobacco Use   Smoking status: Never   Smokeless tobacco: Current  Substance and Sexual Activity   Alcohol use: Not Currently   Drug use: Yes    Types: Marijuana   Sexual activity: Not Currently  Other Topics Concern   Not on file  Social History Narrative   Not on file   Social Determinants of Health   Financial Resource Strain: Not on file  Food Insecurity: Not on file  Transportation Needs: Not on  file  Physical Activity: Not on file  Stress: Not on file  Social Connections: Not on file   Additional Social History:                         Sleep: Fair  Appetite:  Fair  Current Medications: Current Facility-Administered Medications  Medication Dose Route Frequency Provider Last Rate Last Admin   acetaminophen (TYLENOL) tablet 650 mg  650 mg Oral Q6H PRN Babyboy Loya T, MD   650 mg at 09/18/21 1427   alum & mag hydroxide-simeth (MAALOX/MYLANTA) 200-200-20 MG/5ML suspension 30 mL  30 mL Oral Q4H PRN Morna Flud T, MD       buprenorphine-naloxone (SUBOXONE) 8-2 mg per SL tablet 1 tablet  1 tablet Sublingual TID Sanah Kraska, [name], MD   1 tablet at 09/18/21 1617   carbamazepine (TEGRETOL) tablet 200 mg  200 mg Oral BID Kaydance Bowie T, MD   200 mg at 09/18/21 1617   clonazePAM (KLONOPIN) tablet 0.5 mg  0.5 mg Oral TID Cola Highfill T, MD   0.5 mg at 09/18/21 1617   gabapentin (NEURONTIN) capsule 600 mg  600 mg Oral Q8H PRN Malaney Mcbean T, MD   600 mg at 09/18/21 1617   hydrOXYzine (ATARAX) tablet 50 mg  50 mg Oral TID PRN Kada Friesen, [name], MD   50 mg at 09/18/21 1427   ibuprofen (ADVIL) tablet 600 mg  600 mg Oral TID PRN [name], MD   600 mg at  09/18/21 1617   loperamide (IMODIUM) capsule 2 mg  2 mg Oral PRN [name], MD   2 mg at 09/15/21 7782   magnesium hydroxide (MILK OF MAGNESIA) suspension 30 mL  30 mL Oral Daily PRN Drayden Lukas, [name], MD       traZODone (DESYREL) tablet 100 mg  100 mg Oral QHS PRN Alka Falwell T, MD   100 mg at 09/17/21 2105   venlafaxine XR (EFFEXOR-XR) 24 hr capsule 225 mg  225 mg Oral Q breakfast Lenorris Karger, [name], MD   225 mg at 09/18/21 0801    Lab Results: No results found for this or any previous visit (from the past 48 hour(s)).  Blood Alcohol level:  Lab Results  Component Value Date   ETH <10 09/10/2021    Metabolic Disorder Labs: No results found for: "HGBA1C", "MPG" No results found for: "PROLACTIN" No results found for:  "CHOL", "TRIG", "HDL", "CHOLHDL", "VLDL", "LDLCALC"  Physical Findings: AIMS:  , ,  ,  ,    CIWA:    COWS:     Musculoskeletal: Strength & Muscle Tone: within normal limits Gait & Station: normal Patient leans: N/A  Psychiatric Specialty Exam:  Presentation  General Appearance: Appropriate for Environment  Eye Contact:Minimal  Speech:Clear and Coherent  Speech Volume:Normal  Handedness:Right   Mood and Affect  Mood:Anxious; Depressed  Affect:Depressed   Thought Process  Thought Processes:Coherent  Descriptions of Associations:Intact  Orientation:Full (Time, Place and Person)  Thought Content:Logical  History of Schizophrenia/Schizoaffective disorder:No data recorded Duration of Psychotic Symptoms:No data recorded Hallucinations:No data recorded Ideas of Reference:None  Suicidal Thoughts:No data recorded Homicidal Thoughts:No data recorded  Sensorium  Memory:Immediate Good; Recent Good; Remote Good  Judgment:Poor  Insight:Poor   Executive Functions  Concentration:Fair  Attention Span:Good  Recall:Good  Fund of Knowledge:Good  Language:Good   Psychomotor Activity  Psychomotor Activity:No data recorded  Assets  Assets:Communication Skills; Desire for Improvement; Social Support   Sleep  Sleep:No data recorded   Physical Exam: Physical Exam Vitals and nursing note reviewed.  Constitutional:      Appearance: Normal appearance.  HENT:     Head: Normocephalic and atraumatic.     Mouth/Throat:     Pharynx: Oropharynx is clear.  Eyes:     Pupils: Pupils are equal, round, and reactive to light.  Cardiovascular:     Rate and Rhythm: Normal rate and regular rhythm.  Pulmonary:     Effort: Pulmonary effort is normal.     Breath sounds: Normal breath sounds.  Abdominal:     General: Abdomen is flat.     Palpations: Abdomen is soft.  Musculoskeletal:        General: Normal range of motion.  Skin:    General: Skin is warm and dry.   Neurological:     General: No focal deficit present.     Mental Status: She is alert. Mental status is at baseline.  Psychiatric:        Mood and Affect: Mood normal.        Thought Content: Thought content normal.    Review of Systems  Constitutional: Negative.   HENT: Negative.    Eyes: Negative.   Respiratory: Negative.    Cardiovascular: Negative.   Gastrointestinal: Negative.   Musculoskeletal: Negative.   Skin: Negative.   Neurological: Negative.   Psychiatric/Behavioral:  Positive for depression and suicidal ideas.    Blood pressure 99/68, pulse 69, temperature 98.6 F (37 C), temperature source Oral, resp. rate 20, height 5'  8" (1.727 m), weight 125.2 kg, SpO2 96 %. Body mass index is 41.97 kg/m.   Treatment Plan Summary: Plan no change to medication.  Encourage her to be out of her room attending groups and interacting with others and encouraged her to consider the possibility that we will look at discharge by the end of the week  Mordecai Rasmussen, MD 09/18/2021, 4:36 PM

## 2021-09-18 NOTE — Progress Notes (Signed)
Pt calm and pleasant during assessment endorsing anxiety, depression, AH, passive SI. Pt verbally contracts for safety with this writer. Pt observed interacting appropriately with staff and peers on the unit. Pt compliant with medication administration per MD orders. Pt given education, support, and encouragement to be active in her treatment plan. Pt being monitored Q 15 minutes for safety per unit protocol, remains safe on the unit.  

## 2021-09-19 ENCOUNTER — Other Ambulatory Visit: Payer: Self-pay

## 2021-09-19 DIAGNOSIS — F332 Major depressive disorder, recurrent severe without psychotic features: Secondary | ICD-10-CM | POA: Diagnosis not present

## 2021-09-19 MED ORDER — GABAPENTIN 300 MG PO CAPS
600.0000 mg | ORAL_CAPSULE | Freq: Three times a day (TID) | ORAL | 0 refills | Status: DC | PRN
Start: 2021-09-19 — End: 2021-10-17
  Filled 2021-09-19: qty 120, 20d supply, fill #0

## 2021-09-19 MED ORDER — VENLAFAXINE HCL ER 75 MG PO CP24: 225.0000 mg | ORAL_CAPSULE | Freq: Every day | ORAL | 1 refills | Status: DC

## 2021-09-19 MED ORDER — HYDROXYZINE HCL 50 MG PO TABS: 50.0000 mg | ORAL_TABLET | Freq: Three times a day (TID) | ORAL | 1 refills | Status: DC | PRN

## 2021-09-19 MED ORDER — BUPRENORPHINE HCL-NALOXONE HCL 8-2 MG SL SUBL
1.0000 | SUBLINGUAL_TABLET | Freq: Three times a day (TID) | SUBLINGUAL | 1 refills | Status: DC
Start: 2021-09-19 — End: 2022-05-08

## 2021-09-19 MED ORDER — CLONAZEPAM 0.5 MG PO TABS
0.5000 mg | ORAL_TABLET | Freq: Three times a day (TID) | ORAL | 1 refills | Status: AC
Start: 2021-09-19 — End: ?

## 2021-09-19 MED ORDER — TRAZODONE HCL 100 MG PO TABS
100.0000 mg | ORAL_TABLET | Freq: Every evening | ORAL | 0 refills | Status: DC | PRN
Start: 2021-09-19 — End: 2021-10-17
  Filled 2021-09-19: qty 30, 30d supply, fill #0

## 2021-09-19 MED ORDER — CARBAMAZEPINE 200 MG PO TABS
200.0000 mg | ORAL_TABLET | Freq: Two times a day (BID) | ORAL | 1 refills | Status: DC
Start: 2021-09-19 — End: 2021-09-19

## 2021-09-19 MED ORDER — GABAPENTIN 300 MG PO CAPS: 600.0000 mg | ORAL_CAPSULE | Freq: Three times a day (TID) | ORAL | 1 refills | Status: DC | PRN

## 2021-09-19 MED ORDER — VENLAFAXINE HCL ER 75 MG PO CP24
225.0000 mg | ORAL_CAPSULE | Freq: Every day | ORAL | 0 refills | Status: DC
Start: 2021-09-20 — End: 2021-10-17
  Filled 2021-09-19: qty 90, 30d supply, fill #0

## 2021-09-19 MED ORDER — HYDROXYZINE HCL 50 MG PO TABS
50.0000 mg | ORAL_TABLET | Freq: Three times a day (TID) | ORAL | 0 refills | Status: DC | PRN
  Filled 2021-09-19: qty 30, 10d supply, fill #0

## 2021-09-19 MED ORDER — CARBAMAZEPINE 200 MG PO TABS
200.0000 mg | ORAL_TABLET | Freq: Two times a day (BID) | ORAL | 0 refills | Status: DC
Start: 2021-09-19 — End: 2021-10-17
  Filled 2021-09-19: qty 60, 30d supply, fill #0

## 2021-09-19 MED ORDER — TRAZODONE HCL 100 MG PO TABS: 100.0000 mg | ORAL_TABLET | Freq: Every evening | ORAL | 1 refills | Status: DC | PRN

## 2021-09-19 NOTE — Group Note (Signed)
[facility] LCSW Group Therapy Note   Group Date: 09/19/2021 Start Time: 1300 End Time: 1400   Type of Therapy/Topic:  Group Therapy:  Balance in Life  Participation Level:  Active   Description of Group:    This group will address the concept of balance and how it feels and looks when one is unbalanced. Patients will be encouraged to process areas in their lives that are out of balance, and identify reasons for remaining unbalanced. Facilitators will guide patients utilizing problem- solving interventions to address and correct the stressor making their life unbalanced. Understanding and applying boundaries will be explored and addressed for obtaining  and maintaining a balanced life. Patients will be encouraged to explore ways to assertively make their unbalanced needs known to significant others in their lives, using other group members and facilitator for support and feedback.  Therapeutic Goals: Patient will identify two or more emotions or situations they have that consume much of in their lives. Patient will identify signs/triggers that life has become out of balance:  Patient will identify two ways to set boundaries in order to achieve balance in their lives:  Patient will demonstrate ability to communicate their needs through discussion and/or role plays  Summary of Patient Progress: Patient was present for the entirety of the group process. She was actively involved in the discussion and her comments were appropriate. Pt shares that she needs to focus on taking care of herself rather than focusing on others. She states that it's important for her to not take on too much. Pt shares that since her mother moved in with her things have went downhill. She acknowledges that in focusing on caring for her mother she began to let her daily activities which kept her focused on her recovery go. Pt endorsed not taking her medications, or following her WRAP plan, or even doing things that she likes to do,  like walking her dogs. She shared a period when she was able to successfully regain balance through having someone in her life who held her accountable. Pt stated that therapy, attending meetings, planning her wedding, taking her medication, and working through/checking in on her WRAP plan will help her maintain balance moving forward. Pt was open and receptive to comments/feedback from peers and facilitator.    Therapeutic Modalities:   Cognitive Behavioral Therapy Solution-Focused Therapy Assertiveness Training   Glenis Smoker, LCSW

## 2021-09-19 NOTE — Progress Notes (Signed)
[facility] MD Progress Note  09/19/2021 3:08 PM Catherine Mitchell  MRN:  [id number] Subjective: Follow-up patient with recurrent depression.  Mood feeling a little better.  Not having active suicidal thoughts.  No behavior to suggest any psychotic symptoms.  Tolerating medicine well. Principal Problem: Severe recurrent major depression without psychotic features (HCC) Diagnosis: Principal Problem:   Severe recurrent major depression without psychotic features (HCC) Active Problems:   Opioid use disorder   PTSD (post-traumatic stress disorder)  Total Time spent with patient: 30 minutes  Past Psychiatric History: Past history of recurrent depression  Past Medical History: History reviewed. No pertinent past medical history. History reviewed. No pertinent surgical history. Family History: History reviewed. No pertinent family history. Family Psychiatric  History: See previous Social History:  Social History   Substance and Sexual Activity  Alcohol Use Not Currently     Social History   Substance and Sexual Activity  Drug Use Yes   Types: Marijuana    Social History   Socioeconomic History   Marital status: Single    Spouse name: Not on file   Number of children: Not on file   Years of education: Not on file   Highest education level: Not on file  Occupational History   Not on file  Tobacco Use   Smoking status: Never   Smokeless tobacco: Current  Substance and Sexual Activity   Alcohol use: Not Currently   Drug use: Yes    Types: Marijuana   Sexual activity: Not Currently  Other Topics Concern   Not on file  Social History Narrative   Not on file   Social Determinants of Health   Financial Resource Strain: Not on file  Food Insecurity: Not on file  Transportation Needs: Not on file  Physical Activity: Not on file  Stress: Not on file  Social Connections: Not on file   Additional Social History:                         Sleep: Fair  Appetite:   Fair  Current Medications: Current Facility-Administered Medications  Medication Dose Route Frequency Provider Last Rate Last Admin   acetaminophen (TYLENOL) tablet 650 mg  650 mg Oral Q6H PRN Taren Dymek T, MD   650 mg at 09/18/21 2103   alum & mag hydroxide-simeth (MAALOX/MYLANTA) 200-200-20 MG/5ML suspension 30 mL  30 mL Oral Q4H PRN Marnell Mcdaniel T, MD       buprenorphine-naloxone (SUBOXONE) 8-2 mg per SL tablet 1 tablet  1 tablet Sublingual TID Faten Frieson, [name], MD   1 tablet at 09/19/21 1132   carbamazepine (TEGRETOL) tablet 200 mg  200 mg Oral BID Lima Chillemi T, MD   200 mg at 09/19/21 0806   clonazePAM (KLONOPIN) tablet 0.5 mg  0.5 mg Oral TID Wong Steadham T, MD   0.5 mg at 09/19/21 1132   gabapentin (NEURONTIN) capsule 600 mg  600 mg Oral Q8H PRN Abigayle Wilinski T, MD   600 mg at 09/19/21 1132   hydrOXYzine (ATARAX) tablet 50 mg  50 mg Oral TID PRN Hakan Nudelman, [name], MD   50 mg at 09/19/21 1438   ibuprofen (ADVIL) tablet 600 mg  600 mg Oral TID PRN [name], MD   600 mg at 09/19/21 0805   loperamide (IMODIUM) capsule 2 mg  2 mg Oral PRN [name], MD   2 mg at 09/15/21 0823   magnesium hydroxide (MILK OF MAGNESIA) suspension 30 mL  30 mL  Oral Daily PRN Gaudencio Chesnut, [name], MD   30 mL at 09/19/21 0805   traZODone (DESYREL) tablet 100 mg  100 mg Oral QHS PRN Rema Lievanos T, MD   100 mg at 09/18/21 2102   venlafaxine XR (EFFEXOR-XR) 24 hr capsule 225 mg  225 mg Oral Q breakfast Anndee Connett, [name], MD   225 mg at 09/19/21 0805    Lab Results: No results found for this or any previous visit (from the past 48 hour(s)).  Blood Alcohol level:  Lab Results  Component Value Date   ETH <10 09/10/2021    Metabolic Disorder Labs: No results found for: "HGBA1C", "MPG" No results found for: "PROLACTIN" No results found for: "CHOL", "TRIG", "HDL", "CHOLHDL", "VLDL", "LDLCALC"  Physical Findings: AIMS:  , ,  ,  ,    CIWA:    COWS:     Musculoskeletal: Strength & Muscle Tone: within  normal limits Gait & Station: normal Patient leans: N/A  Psychiatric Specialty Exam:  Presentation  General Appearance: Appropriate for Environment  Eye Contact:Minimal  Speech:Clear and Coherent  Speech Volume:Normal  Handedness:Right   Mood and Affect  Mood:Anxious; Depressed  Affect:Depressed   Thought Process  Thought Processes:Coherent  Descriptions of Associations:Intact  Orientation:Full (Time, Place and Person)  Thought Content:Logical  History of Schizophrenia/Schizoaffective disorder:No data recorded Duration of Psychotic Symptoms:No data recorded Hallucinations:No data recorded Ideas of Reference:None  Suicidal Thoughts:No data recorded Homicidal Thoughts:No data recorded  Sensorium  Memory:Immediate Good; Recent Good; Remote Good  Judgment:Poor  Insight:Poor   Executive Functions  Concentration:Fair  Attention Span:Good  Recall:Good  Fund of Knowledge:Good  Language:Good   Psychomotor Activity  Psychomotor Activity:No data recorded  Assets  Assets:Communication Skills; Desire for Improvement; Social Support   Sleep  Sleep:No data recorded   Physical Exam: Physical Exam Constitutional:      Appearance: Normal appearance.  HENT:     Head: Normocephalic and atraumatic.     Mouth/Throat:     Pharynx: Oropharynx is clear.  Eyes:     Pupils: Pupils are equal, round, and reactive to light.  Cardiovascular:     Rate and Rhythm: Normal rate and regular rhythm.  Pulmonary:     Effort: Pulmonary effort is normal.     Breath sounds: Normal breath sounds.  Abdominal:     General: Abdomen is flat.     Palpations: Abdomen is soft.  Musculoskeletal:        General: Normal range of motion.  Skin:    General: Skin is warm and dry.  Neurological:     General: No focal deficit present.     Mental Status: She is alert. Mental status is at baseline.  Psychiatric:        Mood and Affect: Mood normal.        Thought Content:  Thought content normal.    Review of Systems  Constitutional: Negative.   HENT: Negative.    Eyes: Negative.   Respiratory: Negative.    Cardiovascular: Negative.   Gastrointestinal: Negative.   Musculoskeletal: Negative.   Skin: Negative.   Neurological: Negative.   Psychiatric/Behavioral: Negative.     Blood pressure 112/73, pulse 68, temperature 97.8 F (36.6 C), temperature source Oral, resp. rate 18, height 5\' 8"  (1.727 m), weight 125.2 kg, SpO2 100 %. Body mass index is 41.97 kg/m.   Treatment Plan Summary: Medication management and Plan appears to be stabilizing without any acute signs of dangerousness.  Likely plan for discharge tomorrow.  I am requesting a  30-day supply of her medicine from the pharmacy although we cannot give out supplies of the Subutex or the Klonopin.  I will prescribe prescriptions for those as well as her other medicines.  Patient understands and agrees to plan.  She has a future oriented outlook.  Mordecai Rasmussen, MD 09/19/2021, 3:08 PM

## 2021-09-19 NOTE — Progress Notes (Signed)
Patient came to the medication room, tearful, asking for something for anxiety. Patient stated that the doctor came to talk to her and informed her that she may be discharging tomorrow. Patient stated to this writer that "I thought that if you were still suicidal you couldn't leave. I didn't even have time to process. I know my family isn't ready".

## 2021-09-19 NOTE — Plan of Care (Signed)
D- Patient alert and oriented. Patient presented in a pleasant mood on assessment reporting that she slept "fair last night and had complaints of constipation and left hip pain. Patient rated her pain an "8/10", in which she did request PRN medication for both issues. Patient continues to endorse hopelessness, depression, and anxiety, rating them all a "10/10", stating that she "still got a lot on my mind about things" and "racing thoughts", although she reports they have slowed down. Patient also endorsed SI/HI/AVH, "but not in here. The walls are still closing in, but they've gotten better, they've slowed down". Per her self-inventory, patient's goal for today is "depression, anxiety, give medicine time to work some more and self-esteem", in which she will do "what I can", in order to achieve her goal.  A- Scheduled medications administered to patient, per MD orders. Support and encouragement provided.  Routine safety checks conducted every 15 minutes.  Patient informed to notify staff with problems or concerns.  R- No adverse drug reactions noted. Patient contracts for safety at this time. Patient compliant with medications and treatment plan. Patient receptive, calm, and cooperative. Patient interacts well with others on the unit.  Patient remains safe at this time.  Problem: Education: Goal: Knowledge of General Education information will improve Description: Including pain rating scale, medication(s)/side effects and non-pharmacologic comfort measures Outcome: Not Progressing   Problem: Health Behavior/Discharge Planning: Goal: Ability to manage health-related needs will improve Outcome: Not Progressing   Problem: Clinical Measurements: Goal: Ability to maintain clinical measurements within normal limits will improve Outcome: Not Progressing Goal: Will remain free from infection Outcome: Not Progressing Goal: Diagnostic test results will improve Outcome: Not Progressing Goal: Respiratory  complications will improve Outcome: Not Progressing Goal: Cardiovascular complication will be avoided Outcome: Not Progressing   Problem: Activity: Goal: Risk for activity intolerance will decrease Outcome: Not Progressing   Problem: Nutrition: Goal: Adequate nutrition will be maintained Outcome: Not Progressing   Problem: Coping: Goal: Level of anxiety will decrease Outcome: Not Progressing   Problem: Elimination: Goal: Will not experience complications related to bowel motility Outcome: Not Progressing Goal: Will not experience complications related to urinary retention Outcome: Not Progressing   Problem: Pain Managment: Goal: General experience of comfort will improve Outcome: Not Progressing   Problem: Safety: Goal: Ability to remain free from injury will improve Outcome: Not Progressing   Problem: Skin Integrity: Goal: Risk for impaired skin integrity will decrease Outcome: Not Progressing   Problem: Education: Goal: Knowledge of [place] General Education information/materials will improve Outcome: Not Progressing Goal: Emotional status will improve Outcome: Not Progressing Goal: Mental status will improve Outcome: Not Progressing Goal: Verbalization of understanding the information provided will improve Outcome: Not Progressing   Problem: Activity: Goal: Interest or engagement in activities will improve Outcome: Not Progressing Goal: Sleeping patterns will improve Outcome: Not Progressing   Problem: Coping: Goal: Ability to verbalize frustrations and anger appropriately will improve Outcome: Not Progressing Goal: Ability to demonstrate self-control will improve Outcome: Not Progressing   Problem: Health Behavior/Discharge Planning: Goal: Identification of resources available to assist in meeting health care needs will improve Outcome: Not Progressing Goal: Compliance with treatment plan for underlying cause of condition will improve Outcome: Not  Progressing   Problem: Physical Regulation: Goal: Ability to maintain clinical measurements within normal limits will improve Outcome: Not Progressing   Problem: Safety: Goal: Periods of time without injury will increase Outcome: Not Progressing   Problem: Education: Goal: Utilization of techniques to improve thought processes will  improve Outcome: Not Progressing Goal: Knowledge of the prescribed therapeutic regimen will improve Outcome: Not Progressing   Problem: Activity: Goal: Interest or engagement in leisure activities will improve Outcome: Not Progressing Goal: Imbalance in normal sleep/wake cycle will improve Outcome: Not Progressing   Problem: Coping: Goal: Coping ability will improve Outcome: Not Progressing Goal: Will verbalize feelings Outcome: Not Progressing   Problem: Health Behavior/Discharge Planning: Goal: Ability to make decisions will improve Outcome: Not Progressing Goal: Compliance with therapeutic regimen will improve Outcome: Not Progressing   Problem: Role Relationship: Goal: Will demonstrate positive changes in social behaviors and relationships Outcome: Not Progressing   Problem: Safety: Goal: Ability to disclose and discuss suicidal ideas will improve Outcome: Not Progressing Goal: Ability to identify and utilize support systems that promote safety will improve Outcome: Not Progressing   Problem: Self-Concept: Goal: Will verbalize positive feelings about self Outcome: Not Progressing Goal: Level of anxiety will decrease Outcome: Not Progressing   Problem: Education: Goal: Ability to state activities that reduce stress will improve Outcome: Not Progressing   Problem: Coping: Goal: Ability to identify and develop effective coping behavior will improve Outcome: Not Progressing   Problem: Self-Concept: Goal: Ability to identify factors that promote anxiety will improve Outcome: Not Progressing Goal: Level of anxiety will  decrease Outcome: Not Progressing Goal: Ability to modify response to factors that promote anxiety will improve Outcome: Not Progressing

## 2021-09-19 NOTE — Progress Notes (Addendum)
Recreation Therapy Notes  Date: 09/19/2021   Time: 10:45 am     Location: Craft room   Behavioral response: Appropriate  Intervention Topic:  Self-care     Discussion/Intervention:  Group content today was focused on Self-Care. The group defined self-care and some positive ways they care for themselves. Individuals expressed ways and reasons why they neglected any self-care in the past. Patients described ways to improve self-care in the future. The group explained what could happen if they did not do any self-care activities at all. The group participated in the intervention "self-care assessment" where they had a chance to discover some of their weaknesses and strengths in self- care. Patient came up with a self-care plan to improve themselves in the future.  Clinical Observations/Feedback: Patient came to group and defined self-care as; a way to take care of self. She identified taking time to rest, getting nails done and exercise as ways she participates in self-care. Individual was social with peers and staff while participating in the intervention.  Catherine Mitchell LRT/CTRS         Kemper Hochman 09/19/2021 1:34 PM

## 2021-09-19 NOTE — [facility] Group Notes (Signed)
[facility] Group Notes:  (Nursing/MHT/Case Management/Adjunct)  Date:  09/19/2021  Time:  10:09 AM  Type of Therapy:   community meeting  Participation Level:  Did Not Attend   Summary of Progress/Problems:  Rodena Goldmann 09/19/2021, 10:09 AM

## 2021-09-20 DIAGNOSIS — F332 Major depressive disorder, recurrent severe without psychotic features: Secondary | ICD-10-CM | POA: Diagnosis not present

## 2021-09-20 NOTE — BHH Suicide Risk Assessment (Signed)
[facility] Discharge Suicide Risk Assessment   Principal Problem: Severe recurrent major depression without psychotic features [facility]) Discharge Diagnoses: Principal Problem:   Severe recurrent major depression without psychotic features (HCC) Active Problems:   Opioid use disorder   PTSD (post-traumatic stress disorder)   Total Time spent with patient: 30 minutes  Musculoskeletal: Strength & Muscle Tone: within normal limits Gait & Station: normal Patient leans: N/A  Psychiatric Specialty Exam  Presentation  General Appearance: Appropriate for Environment  Eye Contact:Minimal  Speech:Clear and Coherent  Speech Volume:Normal  Handedness:Right   Mood and Affect  Mood:Anxious; Depressed  Duration of Depression Symptoms: No data recorded Affect:Depressed   Thought Process  Thought Processes:Coherent  Descriptions of Associations:Intact  Orientation:Full (Time, Place and Person)  Thought Content:Logical  History of Schizophrenia/Schizoaffective disorder:No data recorded Duration of Psychotic Symptoms:No data recorded Hallucinations:No data recorded Ideas of Reference:None  Suicidal Thoughts:No data recorded Homicidal Thoughts:No data recorded  Sensorium  Memory:Immediate Good; Recent Good; Remote Good  Judgment:Poor  Insight:Poor   Executive Functions  Concentration:Fair  Attention Span:Good  Recall:Good  Fund of Knowledge:Good  Language:Good   Psychomotor Activity  Psychomotor Activity:No data recorded  Assets  Assets:Communication Skills; Desire for Improvement; Social Support   Sleep  Sleep:No data recorded  Physical Exam: Physical Exam Vitals and nursing note reviewed.  Constitutional:      Appearance: Normal appearance.  HENT:     Head: Normocephalic and atraumatic.     Mouth/Throat:     Pharynx: Oropharynx is clear.  Eyes:     Pupils: Pupils are equal, round, and reactive to light.  Cardiovascular:     Rate and Rhythm: Normal  rate and regular rhythm.  Pulmonary:     Effort: Pulmonary effort is normal.     Breath sounds: Normal breath sounds.  Abdominal:     General: Abdomen is flat.     Palpations: Abdomen is soft.  Musculoskeletal:        General: Normal range of motion.  Skin:    General: Skin is warm and dry.  Neurological:     General: No focal deficit present.     Mental Status: She is alert. Mental status is at baseline.  Psychiatric:        Attention and Perception: Attention normal.        Mood and Affect: Mood normal.        Speech: Speech normal.        Behavior: Behavior normal.        Thought Content: Thought content normal.        Cognition and Memory: Cognition normal.        Judgment: Judgment normal.    Review of Systems  Constitutional: Negative.   HENT: Negative.    Eyes: Negative.   Respiratory: Negative.    Cardiovascular: Negative.   Gastrointestinal: Negative.   Musculoskeletal: Negative.   Skin: Negative.   Neurological: Negative.   Psychiatric/Behavioral: Negative.     Blood pressure 113/67, pulse 76, temperature 98.2 F (36.8 C), temperature source Oral, resp. rate 18, height 5\' 8"  (1.727 m), weight 125.2 kg, SpO2 99 %. Body mass index is 41.97 kg/m.  Mental Status Per Nursing Assessment::   On Admission:  Self-harm thoughts, Thoughts of violence towards others, Suicidal ideation indicated by patient, Self-harm behaviors  Demographic Factors:  Caucasian and Low socioeconomic status  Loss Factors: Legal issues  Historical Factors: Impulsivity  Risk Reduction Factors:   Positive social support  Continued Clinical Symptoms:  Bipolar Disorder:  Mixed State Depression:   Hopelessness Alcohol/Substance Abuse/Dependencies  Cognitive Features That Contribute To Risk:  Thought constriction (tunnel vision)    Suicide Risk:  Minimal: No identifiable suicidal ideation.  Patients presenting with no risk factors but with morbid ruminations; may be classified as  minimal risk based on the severity of the depressive symptoms    Plan Of Care/Follow-up recommendations:  Recommend follow-up with outpatient mental health as available given that patient may be going to incarceration first.  Patient is denying all suicidal ideation and has not shown any dangerous behavior in the hospital and remains lucid with good insight and appropriate behavior  Mordecai Rasmussen, MD 09/20/2021, 10:25 AM

## 2021-09-20 NOTE — Progress Notes (Signed)
  [facility] Adult Case Management Discharge Plan :  Will you be returning to the same living situation after discharge:  Yes,  pt to return home upon discharge. At discharge, do you have transportation home?: Yes,  mother to provide transportation. Do you have the ability to pay for your medications: No.  Release of information consent forms completed and in the chart;  Patient's signature needed at discharge.  Patient to Follow up at:  Follow-up Information     [facility] Follow up.   Why: You have an in-person appointment scheduled for Monday, 09/23/21 at 12:45pm. They ask that you call to confirm appointment. Thanks! Contact information: [address] [place], [place] [address] Phone: [phone] Fax: [phone]        [facility] Follow up.   Why: You have a virtual appointment on Wednesday, 10/23/21 at 2pm. Thanks! Contact information: [address] [place], [place] [address] Phone: [phone] Fax: [phone]                Next level of care provider has access to [facility] Link:no  Safety Planning and Suicide Prevention discussed: Yes,  SPE completed with mother.     Has patient been referred to the Quitline?: Patient refused referral  Patient has been referred for addiction treatment: Pt. refused referral  Glenis Smoker, LCSW 09/20/2021, 11:01 AM

## 2021-09-20 NOTE — Progress Notes (Signed)
Recreation Therapy Notes  INPATIENT RECREATION TR PLAN  Patient Details Name: Catherine Mitchell MRN: [id number] DOB: [birth year] Today's Date: 09/20/2021  Rec Therapy Plan Is patient appropriate for Therapeutic Recreation?: Yes Treatment times per week: at least 3 Estimated Length of Stay: 5-7 days TR Treatment/Interventions: Group participation (Comment)  Discharge Criteria Pt will be discharged from therapy if:: Discharged Treatment plan/goals/alternatives discussed and agreed upon by:: Patient/family  Discharge Summary Short term goals set: Patient will demonstrate improved communication skills by spontaneously contributing to 2 group discussions within 5 recreation therapy group sessions Short term goals met: Complete Progress toward goals comments: Groups attended Which groups?: Leisure education, Other (Comment) (Time management, self-care) Reason goals not met: N/A Therapeutic equipment acquired: N/A Reason patient discharged from therapy: Discharge from hospital Pt/family agrees with progress & goals achieved: Yes Date patient discharged from therapy: 09/20/21   Rosabella Edgin 09/20/2021, 11:42 AM

## 2021-09-20 NOTE — Progress Notes (Signed)
Pt discharged to lobby. Pt was stable and appreciative at that time. All papers, samples and prescriptions were given and valuables returned. Verbal understanding expressed. Denies SI/HI and A/VH. Pt given opportunity to express concerns and ask questions.  

## 2021-09-20 NOTE — Plan of Care (Signed)
  Problem: Communication Goal: STG - Patient will demonstrate improved communication skills by spontaneously contributing to 2 group discussions within 5 recreation therapy group sessions Description: STG - Patient will demonstrate improved communication skills by spontaneously contributing to 2 group discussions within 5 recreation therapy group sessions Outcome: Completed/Met

## 2021-09-20 NOTE — Discharge Summary (Signed)
Physician Discharge Summary Note  Patient:  Catherine Mitchell is an 36 y.o., female MRN:  [id number] DOB:  [birth year] Patient phone:  [phone] (home)  Patient address:   [address] [place] [address],  Total Time spent with patient: 30 minutes  Date of Admission:  09/11/2021 Date of Discharge: 09/20/2021  Reason for Admission: Patient was admitted for reported suicidal ideation and depression  Principal Problem: Severe recurrent major depression without psychotic features [facility]) Discharge Diagnoses: Principal Problem:   Severe recurrent major depression without psychotic features (HCC) Active Problems:   Opioid use disorder   PTSD (post-traumatic stress disorder)   Past Psychiatric History: History of depression and suicidal ideation and bipolar PTSD  Past Medical History: History reviewed. No pertinent past medical history. History reviewed. No pertinent surgical history. Family History: History reviewed. No pertinent family history. Family Psychiatric  History: See previous Social History:  Social History   Substance and Sexual Activity  Alcohol Use Not Currently     Social History   Substance and Sexual Activity  Drug Use Yes   Types: Marijuana    Social History   Socioeconomic History   Marital status: Single    Spouse name: Not on file   Number of children: Not on file   Years of education: Not on file   Highest education level: Not on file  Occupational History   Not on file  Tobacco Use   Smoking status: Never   Smokeless tobacco: Current  Substance and Sexual Activity   Alcohol use: Not Currently   Drug use: Yes    Types: Marijuana   Sexual activity: Not Currently  Other Topics Concern   Not on file  Social History Narrative   Not on file   Social Determinants of Health   Financial Resource Strain: Not on file  Food Insecurity: Not on file  Transportation Needs: Not on file  Physical Activity: Not on file  Stress: Not on file  Social  Connections: Not on file    Hospital Course: Continued on 15-minute checks.  Frequently spoke of being suicidal but did not engage in any suicidal self-injury or dangerous behavior.  Did not show outward evidence of disorganized or psychotic thinking.  Continued medication and tolerated them well.  As the days went by became calmer and participated better in groups.  At the time of discharge denied any suicidal ideation and denied active psychotic symptoms.  Agreed to continued outpatient treatment medicines provided at discharge  Physical Findings: AIMS:  , ,  ,  ,    CIWA:    COWS:     Musculoskeletal: Strength & Muscle Tone: within normal limits Gait & Station: normal Patient leans: N/A   Psychiatric Specialty Exam:  Presentation  General Appearance: Appropriate for Environment  Eye Contact:Minimal  Speech:Clear and Coherent  Speech Volume:Normal  Handedness:Right   Mood and Affect  Mood:Anxious; Depressed  Affect:Depressed   Thought Process  Thought Processes:Coherent  Descriptions of Associations:Intact  Orientation:Full (Time, Place and Person)  Thought Content:Logical  History of Schizophrenia/Schizoaffective disorder:No data recorded Duration of Psychotic Symptoms:No data recorded Hallucinations:No data recorded Ideas of Reference:None  Suicidal Thoughts:No data recorded Homicidal Thoughts:No data recorded  Sensorium  Memory:Immediate Good; Recent Good; Remote Good  Judgment:Poor  Insight:Poor   Executive Functions  Concentration:Fair  Attention Span:Good  Recall:Good  Fund of Knowledge:Good  Language:Good   Psychomotor Activity  Psychomotor Activity:No data recorded  Assets  Assets:Communication Skills; Desire for Improvement; Social Support   Sleep  Sleep:No data  recorded   Physical Exam: Physical Exam Vitals and nursing note reviewed.  Constitutional:      Appearance: Normal appearance.  HENT:     Head: Normocephalic  and atraumatic.     Mouth/Throat:     Pharynx: Oropharynx is clear.  Eyes:     Pupils: Pupils are equal, round, and reactive to light.  Cardiovascular:     Rate and Rhythm: Normal rate and regular rhythm.  Pulmonary:     Effort: Pulmonary effort is normal.     Breath sounds: Normal breath sounds.  Abdominal:     General: Abdomen is flat.     Palpations: Abdomen is soft.  Musculoskeletal:        General: Normal range of motion.  Skin:    General: Skin is warm and dry.  Neurological:     General: No focal deficit present.     Mental Status: She is alert. Mental status is at baseline.  Psychiatric:        Attention and Perception: Attention normal.        Mood and Affect: Mood normal.        Speech: Speech normal.        Behavior: Behavior normal.        Thought Content: Thought content normal.        Cognition and Memory: Cognition normal.        Judgment: Judgment normal.    Review of Systems  Constitutional: Negative.   HENT: Negative.    Eyes: Negative.   Respiratory: Negative.    Cardiovascular: Negative.   Gastrointestinal: Negative.   Musculoskeletal: Negative.   Skin: Negative.   Neurological: Negative.   Psychiatric/Behavioral: Negative.     Blood pressure 113/67, pulse 76, temperature 98.2 F (36.8 C), temperature source Oral, resp. rate 18, height 5\' 8"  (1.727 m), weight 125.2 kg, SpO2 99 %. Body mass index is 41.97 kg/m.   Social History   Tobacco Use  Smoking Status Never  Smokeless Tobacco Current   Tobacco Cessation: None   Blood Alcohol level:  Lab Results  Component Value Date   ETH <10 09/10/2021    Metabolic Disorder Labs:  No results found for: "HGBA1C", "MPG" No results found for: "PROLACTIN" No results found for: "CHOL", "TRIG", "HDL", "CHOLHDL", "VLDL", "LDLCALC"  See Psychiatric Specialty Exam and Suicide Risk Assessment completed by Attending Physician prior to discharge.  Discharge destination:  Home  Is patient on multiple  antipsychotic therapies at discharge:  No   Has Patient had three or more failed trials of antipsychotic monotherapy by history:  No  Recommended Plan for Multiple Antipsychotic Therapies: NA  Discharge Instructions     Diet - low sodium heart healthy   Complete by: As directed    Increase activity slowly   Complete by: As directed       Allergies as of 09/20/2021       Reactions   Sulfa Antibiotics Anaphylaxis        Medication List     TAKE these medications      Indication  buprenorphine-naloxone 8-2 mg Subl SL tablet Commonly known as: SUBOXONE Place 1 tablet under the tongue 3 (three) times daily.  Indication: Opioid Dependence   carbamazepine 200 MG tablet Commonly known as: TEGRETOL Take 1 tablet (200 mg total) by mouth 2 (two) times daily.  Indication: Manic-Depression   clonazePAM 0.5 MG tablet Commonly known as: KLONOPIN Take 1 tablet (0.5 mg total) by mouth 3 (three) times daily.  Indication:  Feeling Anxious   gabapentin 300 MG capsule Commonly known as: NEURONTIN Take 2 capsules (600 mg total) by mouth every 8 (eight) hours as needed (Anxiety).  Indication: Anxiety   hydrOXYzine 50 MG tablet Commonly known as: ATARAX Take 1 tablet (50 mg total) by mouth 3 (three) times daily as needed for anxiety.  Indication: Feeling Anxious   traZODone 100 MG tablet Commonly known as: DESYREL Take 1 tablet (100 mg total) by mouth at bedtime as needed for sleep.  Indication: Trouble Sleeping   venlafaxine XR 75 MG 24 hr capsule Commonly known as: EFFEXOR-XR Take 3 capsules (225 mg total) by mouth daily with breakfast.  Indication: Major Depressive Disorder        Follow-up Information     [facility] Follow up.   Why: You have an in-person appointment scheduled for Monday, 09/23/21 at 12:45pm. They ask that you call to confirm appointment. Thanks! Contact information: [address] [place], [place] [address] Phone:  [phone] Fax: [phone]        [facility] Follow up.   Why: You have a virtual appointment on Wednesday, 10/23/21 at 2pm. Thanks! Contact information: [address] [place], [place] [address] Phone: [phone] Fax: [phone]                Follow-up recommendations:  Other:  Recommend continuing medication management following up with outpatient resources in the home community and avoiding all alcohol and drugs  Comments: Prescriptions provided at discharge  Signed: Mordecai Rasmussen, MD 09/20/2021, 3:56 PM

## 2021-09-20 NOTE — Progress Notes (Signed)
Recreation Therapy Notes    Date: 09/20/2021   Time: 10:50 am     Location: Court yard       Behavioral response: N/A   Intervention Topic: Values    Discussion/Intervention: Patient refused to attend group.    Clinical Observations/Feedback:  Patient refused to attend group.    Fallen Crisostomo LRT/CTRS          Treyson Axel 09/20/2021 11:19 AM

## 2021-10-11 ENCOUNTER — Other Ambulatory Visit: Payer: Self-pay

## 2021-10-17 ENCOUNTER — Other Ambulatory Visit: Payer: Self-pay

## 2021-10-17 MED ORDER — VENLAFAXINE HCL ER 75 MG PO CP24
225.0000 mg | ORAL_CAPSULE | Freq: Every day | ORAL | 1 refills | Status: AC
  Filled 2021-10-17: qty 90, 30d supply, fill #0

## 2021-10-17 MED ORDER — CARBAMAZEPINE 200 MG PO TABS
200.0000 mg | ORAL_TABLET | Freq: Two times a day (BID) | ORAL | 1 refills | Status: AC
Start: 2021-09-19 — End: ?
  Filled 2021-10-17: qty 60, 30d supply, fill #0
  Filled 2022-06-16: qty 60, 30d supply, fill #1

## 2021-10-17 MED ORDER — GABAPENTIN 300 MG PO CAPS
600.0000 mg | ORAL_CAPSULE | Freq: Three times a day (TID) | ORAL | 1 refills | Status: DC | PRN
  Filled 2021-10-17: qty 120, 20d supply, fill #0

## 2021-10-17 MED ORDER — HYDROXYZINE HCL 50 MG PO TABS
50.0000 mg | ORAL_TABLET | Freq: Three times a day (TID) | ORAL | 1 refills | Status: DC
  Filled 2021-10-17: qty 30, 10d supply, fill #0

## 2021-10-17 MED ORDER — TRAZODONE HCL 100 MG PO TABS
100.0000 mg | ORAL_TABLET | Freq: Every evening | ORAL | 1 refills | Status: DC | PRN
  Filled 2021-10-17: qty 30, 30d supply, fill #0

## 2022-01-20 ENCOUNTER — Ambulatory Visit
Admission: EM | Admit: 2022-01-20 | Discharge: 2022-01-20 | Disposition: A | Discharge status: Home / Self Care | Payer: Commercial Managed Care - HMO | Attending: Urgent Care | Admitting: Urgent Care

## 2022-01-20 DIAGNOSIS — Z79899 Other long term (current) drug therapy: Secondary | ICD-10-CM | POA: Insufficient documentation

## 2022-01-20 DIAGNOSIS — B9689 Other specified bacterial agents as the cause of diseases classified elsewhere: Secondary | ICD-10-CM | POA: Diagnosis present

## 2022-01-20 DIAGNOSIS — R6889 Other general symptoms and signs: Secondary | ICD-10-CM | POA: Diagnosis not present

## 2022-01-20 DIAGNOSIS — Z792 Long term (current) use of antibiotics: Secondary | ICD-10-CM | POA: Insufficient documentation

## 2022-01-20 DIAGNOSIS — Z1152 Encounter for screening for COVID-19: Secondary | ICD-10-CM | POA: Diagnosis not present

## 2022-01-20 DIAGNOSIS — Z7952 Long term (current) use of systemic steroids: Secondary | ICD-10-CM | POA: Diagnosis not present

## 2022-01-20 DIAGNOSIS — J209 Acute bronchitis, unspecified: Secondary | ICD-10-CM | POA: Insufficient documentation

## 2022-01-20 DIAGNOSIS — J019 Acute sinusitis, unspecified: Secondary | ICD-10-CM | POA: Diagnosis present

## 2022-01-20 MED ORDER — HYDROCOD POLI-CHLORPHE POLI ER 10-8 MG/5ML PO SUER: 5.0000 mL | Freq: Two times a day (BID) | ORAL | 0 refills | Status: DC | PRN

## 2022-01-20 MED ORDER — BENZONATATE 100 MG PO CAPS: ORAL_CAPSULE | ORAL | 0 refills | Status: DC

## 2022-01-20 MED ORDER — PROMETHAZINE-DM 6.25-15 MG/5ML PO SYRP: 5.0000 mL | ORAL_SOLUTION | Freq: Four times a day (QID) | ORAL | 0 refills | Status: AC | PRN

## 2022-01-20 MED ORDER — PREDNISONE 20 MG PO TABS: ORAL_TABLET | ORAL | 0 refills | Status: AC

## 2022-01-20 MED ORDER — AMOXICILLIN-POT CLAVULANATE 875-125 MG PO TABS: 1.0000 | ORAL_TABLET | Freq: Two times a day (BID) | ORAL | 0 refills | Status: DC

## 2022-01-20 NOTE — ED Provider Notes (Signed)
Catherine Mitchell    CSN: [id number] Arrival date & time: 01/20/22  1558      History   Chief Complaint Chief Complaint  Patient presents with   Cough   Nausea   Emesis   Nasal Congestion   Sore Throat    HPI Catherine Mitchell is a 36 y.o. female.    Cough Emesis Associated symptoms: cough   Sore Throat    Presents with 3-week history of cough, nausea, vomiting, lightheadedness, sore throat, nasal congestion, cough.  She states symptoms had improved but starting in the last few days, she lost her voice and now complains of facial pressure around her nose and eyes.  Past Medical History:  Diagnosis Date   Anxiety    Depression    HSV (herpes simplex virus) anogenital infection    Hypoglycemia    Kidney stone    Vaginal Pap smear, abnormal     Patient Active Problem List   Diagnosis Date Noted   Opioid use disorder 09/12/2021   PTSD (post-traumatic stress disorder) 09/12/2021   Severe recurrent major depression without psychotic features (HCC) 09/11/2021   MDD (major depressive disorder), recurrent episode, severe (HCC) 09/10/2021   Suicidal behavior with attempted self-injury (HCC) 09/10/2021   Intentional overdose (HCC)    Kidney stone 11/16/2018   Yeast vaginitis 12/17/2016   H N P-CERVICAL 09/18/2008   Sprain of neck 08/30/2008    Past Surgical History:  Procedure Laterality Date   INTRAUTERINE DEVICE INSERTION  04/20/2013   ParaGard   nexplanon removal  2015   URETHRA SURGERY  had it stretched as a child   WISDOM TOOTH EXTRACTION      OB History   No obstetric history on file.      Home Medications    Prior to Admission medications   Medication Sig Start Date End Date Taking? Authorizing Provider  albuterol (VENTOLIN HFA) 108 (90 Base) MCG/ACT inhaler Inhale 2 puffs into the lungs every 4 (four) hours as needed for wheezing or shortness of breath. 05/02/20   Delton See, MD  buprenorphine-naloxone (SUBOXONE) 8-2 mg SUBL SL tablet  Place 1 tablet under the tongue 3 (three) times daily. 09/19/21   Clapacs, [name], MD  carbamazepine (TEGRETOL) 200 MG tablet Take 1 tablet (200 mg total) by mouth 2 (two) times daily. 09/19/21   Clapacs, [name], MD  cetirizine-pseudoephedrine (ZYRTEC-D) 5-120 MG tablet Take 1 tablet by mouth 2 (two) times daily. 11/25/19   Tommie Sams, DO  clonazePAM (KLONOPIN) 0.5 MG tablet Take 1 tablet (0.5 mg total) by mouth 3 (three) times daily. 09/19/21   Clapacs, [name], MD  clonazePAM (KLONOPIN) 1 MG tablet Take 1 mg by mouth 3 (three) times daily.    [provider]  gabapentin (NEURONTIN) 300 MG capsule Take 2 capsules (600 mg total) by mouth every 8 (eight) hours as needed for anxiety. 09/19/21   Clapacs, [name], MD  gabapentin (NEURONTIN) 600 MG tablet Take 600 mg by mouth 3 (three) times daily.    [provider]  hydrOXYzine (ATARAX) 50 MG tablet Take 1 tablet (50 mg total) by mouth 3 (three) times daily as needed for anxiety. 09/19/21   Clapacs, [name], MD  hydrOXYzine (ATARAX) 50 MG tablet Take 1 tablet (50 mg total) by mouth 3 (three) times daily as needed for anxiety. 09/19/21   Clapacs, [name], MD  ibuprofen (ADVIL) 800 MG tablet Take 1 tablet (800 mg total) by mouth every 8 (eight) hours  as needed for mild pain. 05/02/21   Ward, Layla Maw, DO  ipratropium (ATROVENT) 0.06 % nasal spray Place 2 sprays into both nostrils 4 (four) times daily as needed for rhinitis. 10/22/20   Tommie Sams, DO  methylphenidate 54 MG PO CR tablet Take 54 mg by mouth every morning.    [provider]  promethazine-dextromethorphan (PROMETHAZINE-DM) 6.25-15 MG/5ML syrup Take 5 mLs by mouth 4 (four) times daily as needed for cough. 10/22/20   Tommie Sams, DO  traZODone (DESYREL) 100 MG tablet Take 1 tablet (100 mg total) by mouth at bedtime as needed for sleep. 09/19/21   Clapacs, [name], MD  venlafaxine XR (EFFEXOR-XR) 75 MG 24 hr capsule Take 150 mg by mouth daily with breakfast.     [provider]  venlafaxine XR (EFFEXOR-XR) 75 MG 24 hr capsule Take 3 capsules (225 mg total) by mouth daily with breakfast. 09/19/21   Clapacs, [name], MD  norethindrone-ethinyl estradiol (JUNEL FE,GILDESS FE,LOESTRIN FE) 1-20 MG-MCG tablet Take 1 tablet by mouth daily. 08/18/16 07/27/18  Harrington Challenger, NP  Norethindrone-Ethinyl Estradiol-Fe Biphas (LO LOESTRIN FE) 1 MG-10 MCG / 10 MCG tablet Take 1 tablet by mouth daily. 01/04/19 04/18/19  Harrington Challenger, NP  topiramate (TOPAMAX) 50 MG tablet Take 50 mg by mouth daily.  07/27/18  [provider]    Family History Family History  Problem Relation Age of Onset   Diabetes Mother    Thyroid disease Mother    Diabetes Maternal Grandmother    Hypertension Father     Social History Social History   Tobacco Use   Smoking status: Never   Smokeless tobacco: Current  Vaping Use   Vaping Use: Never used  Substance Use Topics   Alcohol use: Not Currently   Drug use: Yes    Types: Marijuana     Allergies   Elemental sulfur, Sulfa antibiotics, Latex, Sulfa antibiotics, Sulfa antibiotics, and Tomato   Review of Systems Review of Systems  Respiratory:  Positive for cough.   Gastrointestinal:  Positive for vomiting.     Physical Exam Triage Vital Signs ED Triage Vitals [01/20/22 1752]  Enc Vitals Group     BP 121/80     Pulse Rate 67     Resp 18     Temp 98 F (36.7 C)     Temp src      SpO2 98 %     Weight      Height      Head Circumference      Peak Flow      Pain Score 0     Pain Loc      Pain Edu?      Excl. in GC?    No data found.  Updated Vital Signs BP 121/80   Pulse 67   Temp 98 F (36.7 C)   Resp 18   LMP 12/23/2021 (Approximate)   SpO2 98%   Visual Acuity Right Eye Distance:   Left Eye Distance:   Bilateral Distance:    Right Eye Near:   Left Eye Near:    Bilateral Near:     Physical Exam Vitals reviewed.  Constitutional:      Appearance: She is well-developed.  HENT:     Nose:      Right Sinus: Maxillary sinus tenderness present.     Left Sinus: Maxillary sinus tenderness present.  Cardiovascular:     Rate and Rhythm: Normal rate and regular rhythm.  Heart sounds: Normal heart sounds.  Pulmonary:     Effort: Pulmonary effort is normal.     Breath sounds: Normal breath sounds.  Skin:    General: Skin is warm and dry.  Neurological:     General: No focal deficit present.     Mental Status: She is alert and oriented to person, place, and time.  Psychiatric:        Mood and Affect: Mood normal.        Behavior: Behavior normal.      UC Treatments / Results  Labs (all labs ordered are listed, but only abnormal results are displayed) Labs Reviewed - No data to display  EKG   Radiology No results found.  Procedures Procedures (including critical care time)  Medications Ordered in UC Medications - No data to display  Initial Impression / Assessment and Plan / UC Course  I have reviewed the triage vital signs and the nursing notes.  Pertinent labs & imaging results that were available during my care of the patient were reviewed by me and considered in my medical decision making (see chart for details).   Patient is afebrile here without recent antipyretics. Satting well on room air. Overall is well appearing, well hydrated, without respiratory distress. Pulmonary exam is unremarkable.  Lungs CTAB without wheezes, rales, or rhonchi.  Bilateral maxillary sinus tenderness with palpation.  Suspect bacterial sinusitis secondary to past viral URI.  Will treat with Augmentin x 7 days.  Also prescribing prednisone taper to relieve sinus inflammation and cough.  Giving benzonatate and Tussionex for cough.  Final Clinical Impressions(s) / UC Diagnoses   Final diagnoses:  None   Discharge Instructions   None    ED Prescriptions   None    PDMP not reviewed this encounter.   Charma Igo, [place] 01/20/22 1759

## 2022-01-20 NOTE — ED Triage Notes (Signed)
Pt. Presents to UC w/ c/o a cough, nausea, emesis, lightheadedness, sore throat, nasal congestion, and cough for the past 3 weeks but has worsened since yesterday.

## 2022-01-20 NOTE — Discharge Instructions (Addendum)
Follow up here or with your primary care provider if your symptoms are worsening or not improving with treatment.     

## 2022-01-21 ENCOUNTER — Ambulatory Visit: Payer: BLUE CROSS/BLUE SHIELD

## 2022-01-21 LAB — RESP PANEL BY RT-PCR (FLU A&B, COVID) ARPGX2
Influenza A by PCR: NEGATIVE
Influenza B by PCR: NEGATIVE
SARS Coronavirus 2 by RT PCR: NEGATIVE

## 2022-02-24 ENCOUNTER — Other Ambulatory Visit: Payer: Medicaid Other

## 2022-04-17 ENCOUNTER — Telehealth: Payer: 59 | Admitting: Physician Assistant

## 2022-04-17 DIAGNOSIS — K2900 Acute gastritis without bleeding: Secondary | ICD-10-CM

## 2022-04-17 DIAGNOSIS — K529 Noninfective gastroenteritis and colitis, unspecified: Secondary | ICD-10-CM

## 2022-04-17 MED ORDER — ONDANSETRON 4 MG PO TBDP: 4.0000 mg | ORAL_TABLET | Freq: Three times a day (TID) | ORAL | 0 refills | Status: DC | PRN

## 2022-04-17 MED ORDER — PANTOPRAZOLE SODIUM 40 MG PO TBEC: 40.0000 mg | DELAYED_RELEASE_TABLET | Freq: Every day | ORAL | 0 refills | Status: DC

## 2022-04-17 NOTE — Progress Notes (Signed)
Virtual Visit Consent   Catherine Mitchell, you are scheduled for a virtual visit with a [facility] provider today. Just as with appointments in the office, your consent must be obtained to participate. Your consent will be active for this visit and any virtual visit you may have with one of our providers in the next 365 days. If you have a MyChart account, a copy of this consent can be sent to you electronically.  As this is a virtual visit, video technology does not allow for your provider to perform a traditional examination. This may limit your provider's ability to fully assess your condition. If your provider identifies any concerns that need to be evaluated in person or the need to arrange testing (such as labs, EKG, etc.), we will make arrangements to do so. Although advances in technology are sophisticated, we cannot ensure that it will always work on either your end or our end. If the connection with a video visit is poor, the visit may have to be switched to a telephone visit. With either a video or telephone visit, we are not always able to ensure that we have a secure connection.  By engaging in this virtual visit, you consent to the provision of healthcare and authorize for your insurance to be billed (if applicable) for the services provided during this visit. Depending on your insurance coverage, you may receive a charge related to this service.  I need to obtain your verbal consent now. Are you willing to proceed with your visit today? Catherine Mitchell has provided verbal consent on 04/17/2022 for a virtual visit (video or telephone). Catherine Mitchell, [place]  Date: 04/17/2022 9:23 AM  Virtual Visit via Video Note   I, Catherine Mitchell, connected with  Catherine Mitchell  ([id number], 29-Mar-1985) on 04/17/22 at  9:00 AM EDT by a video-enabled telemedicine application and verified that I am speaking with the correct person using two identifiers.  Location: Patient: Virtual Visit  Location Patient: Home Provider: Virtual Visit Location Provider: Home Office   I discussed the limitations of evaluation and management by telemedicine and the availability of in person appointments. The patient expressed understanding and agreed to proceed.    History of Present Illness: Catherine Mitchell is a 37 y.o. who identifies as a female who was assigned female at birth, and is being seen today for 2 days of abdominal pain (upper abdominal, cramping travels to [place]), headache, nausea and frequent loose stools. Unable to eat but is able to keep down water and 7 up. Denies any noted heartburn or indigestion. Fever (Tmax 101) intermittent -- goes away during the day. Frequent loose stool. Denies melena, hematochezia or tenesmus.Denies recent travel, sick contact.  Had some pineapple that did not taste right but her significant other ate the same without issue.   OTC -- Pepto Bismol, Imodium, Tylenol.    HPI: HPI  Problems:  Patient Active Problem List   Diagnosis Date Noted   Opioid use disorder 09/12/2021   PTSD (post-traumatic stress disorder) 09/12/2021   Severe recurrent major depression without psychotic features ([place]) 09/11/2021   MDD (major depressive disorder), recurrent episode, severe ([facility]) 09/10/2021   Suicidal behavior with attempted self-injury ([place]) 09/10/2021   Intentional overdose ([place])    Kidney stone 11/16/2018   Yeast vaginitis 12/17/2016   H N P-CERVICAL 09/18/2008   Sprain of neck 08/30/2008    Allergies:  Allergies  Allergen Reactions   Elemental Sulfur Other (See Comments)  Throat swells,blisters   Sulfa Antibiotics Anaphylaxis   Latex     BREAKS OUT HANDS   Sulfa Antibiotics Hives   Sulfa Antibiotics    Tomato     GI UPSET   Medications:  Current Outpatient Medications:    ondansetron (ZOFRAN-ODT) 4 MG disintegrating tablet, Take 1 tablet (4 mg total) by mouth every 8 (eight) hours as needed for nausea or vomiting., Disp: 20 tablet,  Rfl: 0   pantoprazole (PROTONIX) 40 MG tablet, Take 1 tablet (40 mg total) by mouth daily., Disp: 14 tablet, Rfl: 0   albuterol (VENTOLIN HFA) 108 (90 Base) MCG/ACT inhaler, Inhale 2 puffs into the lungs every 4 (four) hours as needed for wheezing or shortness of breath., Disp: 18 g, Rfl: 0   buprenorphine-naloxone (SUBOXONE) 8-2 mg SUBL SL tablet, Place 1 tablet under the tongue 3 (three) times daily., Disp: 90 tablet, Rfl: 1   carbamazepine (TEGRETOL) 200 MG tablet, Take 1 tablet (200 mg total) by mouth 2 (two) times daily., Disp: 60 tablet, Rfl: 1   cetirizine-pseudoephedrine (ZYRTEC-D) 5-120 MG tablet, Take 1 tablet by mouth 2 (two) times daily., Disp: 30 tablet, Rfl: 0   clonazePAM (KLONOPIN) 0.5 MG tablet, Take 1 tablet (0.5 mg total) by mouth 3 (three) times daily., Disp: 90 tablet, Rfl: 1   clonazePAM (KLONOPIN) 1 MG tablet, Take 1 mg by mouth 3 (three) times daily., Disp: , Rfl:    gabapentin (NEURONTIN) 300 MG capsule, Take 2 capsules (600 mg total) by mouth every 8 (eight) hours as needed for anxiety., Disp: 120 capsule, Rfl: 1   gabapentin (NEURONTIN) 600 MG tablet, Take 600 mg by mouth 3 (three) times daily., Disp: , Rfl:    hydrOXYzine (ATARAX) 50 MG tablet, Take 1 tablet (50 mg total) by mouth 3 (three) times daily as needed for anxiety., Disp: 30 tablet, Rfl: 1   hydrOXYzine (ATARAX) 50 MG tablet, Take 1 tablet (50 mg total) by mouth 3 (three) times daily as needed for anxiety., Disp: 30 tablet, Rfl: 0   ibuprofen (ADVIL) 800 MG tablet, Take 1 tablet (800 mg total) by mouth every 8 (eight) hours as needed for mild pain., Disp: 30 tablet, Rfl: 0   ipratropium (ATROVENT) 0.06 % nasal spray, Place 2 sprays into both nostrils 4 (four) times daily as needed for rhinitis., Disp: 15 mL, Rfl: 0   methylphenidate 54 MG PO CR tablet, Take 54 mg by mouth every morning., Disp: , Rfl:    traZODone (DESYREL) 100 MG tablet, Take 1 tablet (100 mg total) by mouth at bedtime as needed for sleep., Disp:  30 tablet, Rfl: 1   venlafaxine XR (EFFEXOR-XR) 75 MG 24 hr capsule, Take 150 mg by mouth daily with breakfast. , Disp: , Rfl:    venlafaxine XR (EFFEXOR-XR) 75 MG 24 hr capsule, Take 3 capsules (225 mg total) by mouth daily with breakfast., Disp: 90 capsule, Rfl: 1  Observations/Objective: Patient is well-developed, well-nourished in no acute distress.  Resting comfortably  at home.  Head is normocephalic, atraumatic.  No labored breathing.  Speech is clear and coherent with logical content.  Patient is alert and oriented at baseline.   Assessment and Plan: 1. Gastroenteritis - ondansetron (ZOFRAN-ODT) 4 MG disintegrating tablet; Take 1 tablet (4 mg total) by mouth every 8 (eight) hours as needed for nausea or vomiting.  Dispense: 20 tablet; Refill: 0  2. Acute gastritis without hemorrhage, unspecified gastritis type - pantoprazole (PROTONIX) 40 MG tablet; Take 1 tablet (40 mg total) by  mouth daily.  Dispense: 14 tablet; Refill: 0  Low-grade fever nighttime only. No melena, hematochezia or tenesmus. Nausea without vomiting. Symptoms seem consistent with a gastroenteritis and secondary gastritis. Supportive measures and OTC medications reviewed. Continue sips of water, 7 up or Pedialyte. Start [employer].  Start Pantoprazole once daily for 14 days. Zofran per orders. Strict in person follow-up and ER precautions reviewed. Work note provided.   Follow Up Instructions: I discussed the assessment and treatment plan with the patient. The patient was provided an opportunity to ask questions and all were answered. The patient agreed with the plan and demonstrated an understanding of the instructions.  A copy of instructions were sent to the patient via MyChart unless otherwise noted below.   The patient was advised to call back or seek an in-person evaluation if the symptoms worsen or if the condition fails to improve as anticipated.  Time:  I spent 10 minutes with the patient via telehealth  technology discussing the above problems/concerns.    Catherine Rio, PA-C

## 2022-04-17 NOTE — Patient Instructions (Signed)
Catherine Mitchell, thank you for joining Leeanne Rio, PA-C for today's virtual visit.  While this provider is not your primary care provider (PCP), if your PCP is located in our provider database this encounter information will be shared with them immediately following your visit.   [facility] account gives you access to today's visit and all your visits, tests, and labs performed at [facility] " click here if you don't have a [facility] account or go to [email or web address]  Consent: (Patient) Catherine Mitchell provided verbal consent for this virtual visit at the beginning of the encounter.  Current Medications:  Current Outpatient Medications:    albuterol (VENTOLIN HFA) 108 (90 Base) MCG/ACT inhaler, Inhale 2 puffs into the lungs every 4 (four) hours as needed for wheezing or shortness of breath., Disp: 18 g, Rfl: 0   amoxicillin-clavulanate (AUGMENTIN) 875-125 MG tablet, Take 1 tablet by mouth every 12 (twelve) hours., Disp: 14 tablet, Rfl: 0   benzonatate (TESSALON) 100 MG capsule, Take 1-2 tablets 3 times a day as needed for cough, Disp: 30 capsule, Rfl: 0   buprenorphine-naloxone (SUBOXONE) 8-2 mg SUBL SL tablet, Place 1 tablet under the tongue 3 (three) times daily., Disp: 90 tablet, Rfl: 1   carbamazepine (TEGRETOL) 200 MG tablet, Take 1 tablet (200 mg total) by mouth 2 (two) times daily., Disp: 60 tablet, Rfl: 1   cetirizine-pseudoephedrine (ZYRTEC-D) 5-120 MG tablet, Take 1 tablet by mouth 2 (two) times daily., Disp: 30 tablet, Rfl: 0   clonazePAM (KLONOPIN) 0.5 MG tablet, Take 1 tablet (0.5 mg total) by mouth 3 (three) times daily., Disp: 90 tablet, Rfl: 1   clonazePAM (KLONOPIN) 1 MG tablet, Take 1 mg by mouth 3 (three) times daily., Disp: , Rfl:    gabapentin (NEURONTIN) 300 MG capsule, Take 2 capsules (600 mg total) by mouth every 8 (eight) hours as needed for anxiety., Disp: 120 capsule, Rfl: 1   gabapentin (NEURONTIN) 600 MG tablet,  Take 600 mg by mouth 3 (three) times daily., Disp: , Rfl:    hydrOXYzine (ATARAX) 50 MG tablet, Take 1 tablet (50 mg total) by mouth 3 (three) times daily as needed for anxiety., Disp: 30 tablet, Rfl: 1   hydrOXYzine (ATARAX) 50 MG tablet, Take 1 tablet (50 mg total) by mouth 3 (three) times daily as needed for anxiety., Disp: 30 tablet, Rfl: 0   ibuprofen (ADVIL) 800 MG tablet, Take 1 tablet (800 mg total) by mouth every 8 (eight) hours as needed for mild pain., Disp: 30 tablet, Rfl: 0   ipratropium (ATROVENT) 0.06 % nasal spray, Place 2 sprays into both nostrils 4 (four) times daily as needed for rhinitis., Disp: 15 mL, Rfl: 0   methylphenidate 54 MG PO CR tablet, Take 54 mg by mouth every morning., Disp: , Rfl:    traZODone (DESYREL) 100 MG tablet, Take 1 tablet (100 mg total) by mouth at bedtime as needed for sleep., Disp: 30 tablet, Rfl: 1   venlafaxine XR (EFFEXOR-XR) 75 MG 24 hr capsule, Take 150 mg by mouth daily with breakfast. , Disp: , Rfl:    venlafaxine XR (EFFEXOR-XR) 75 MG 24 hr capsule, Take 3 capsules (225 mg total) by mouth daily with breakfast., Disp: 90 capsule, Rfl: 1   Medications ordered in this encounter:  No orders of the defined types were placed in this encounter.    *If you need refills on other medications prior to your next appointment, please contact your pharmacy*  Follow-Up: Call  back or seek an in-person evaluation if the symptoms worsen or if the condition fails to improve as anticipated.  [facility] [phone]  Other Instructions Please keep hydrated -- sips of water, 7up or even pedialyte.  Use the zofran as directed for nausea. Start the pantoprazole once daily.  Once feeling up to eating, follow dietary recommendations below.  If not quickly improving and then resolving or anything worsening despite recommendations, you need an in-person evaluation ASAP. IF you are unable to keep fluids in or any worsening of the pain -- ER. DO NOT  DELAY CARE   If you have been instructed to have an in-person evaluation today at a local Urgent Care facility, please use the link below. It will take you to a list of all of our available [facility], including address, phone number and hours of operation. Please do not delay care.  [facility]  If you or a family member do not have a primary care provider, use the link below to schedule a visit and establish care. When you choose a [facility] primary care physician or advanced practice provider, you gain a long-term partner in health. Find a Primary Care Provider  Learn more about 's in-office and virtual care options: [facility]

## 2022-05-08 ENCOUNTER — Ambulatory Visit
Admission: RE | Admit: 2022-05-08 | Discharge: 2022-05-08 | Disposition: A | Discharge status: Home / Self Care | Payer: 59 | Source: Ambulatory Visit | Attending: Physician Assistant | Admitting: Physician Assistant

## 2022-05-08 ENCOUNTER — Emergency Department: Payer: 59

## 2022-05-08 ENCOUNTER — Emergency Department
Admission: EM | Admit: 2022-05-08 | Discharge: 2022-05-08 | Disposition: A | Discharge status: Home / Self Care | Payer: 59 | Attending: Emergency Medicine | Admitting: Emergency Medicine

## 2022-05-08 VITALS — BP 117/81 | HR 67 | Temp 98.1°F | Resp 20

## 2022-05-08 DIAGNOSIS — M545 Low back pain, unspecified: Secondary | ICD-10-CM | POA: Diagnosis not present

## 2022-05-08 DIAGNOSIS — R109 Unspecified abdominal pain: Secondary | ICD-10-CM | POA: Diagnosis present

## 2022-05-08 DIAGNOSIS — R1032 Left lower quadrant pain: Secondary | ICD-10-CM | POA: Diagnosis not present

## 2022-05-08 DIAGNOSIS — Z87442 Personal history of urinary calculi: Secondary | ICD-10-CM

## 2022-05-08 DIAGNOSIS — R300 Dysuria: Secondary | ICD-10-CM | POA: Insufficient documentation

## 2022-05-08 DIAGNOSIS — R1031 Right lower quadrant pain: Secondary | ICD-10-CM | POA: Insufficient documentation

## 2022-05-08 DIAGNOSIS — R3915 Urgency of urination: Secondary | ICD-10-CM | POA: Diagnosis not present

## 2022-05-08 LAB — POCT URINALYSIS DIP (MANUAL ENTRY)
Bilirubin, UA: NEGATIVE
Blood, UA: NEGATIVE
Glucose, UA: NEGATIVE mg/dL
Ketones, POC UA: NEGATIVE mg/dL
Leukocytes, UA: NEGATIVE
Nitrite, UA: NEGATIVE
Protein Ur, POC: NEGATIVE mg/dL
Spec Grav, UA: 1.025 (ref 1.010–1.025)
Urobilinogen, UA: 0.2 E.U./dL
pH, UA: 7.5 (ref 5.0–8.0)

## 2022-05-08 LAB — COMPREHENSIVE METABOLIC PANEL
ALT: 20 U/L (ref 0–44)
AST: 22 U/L (ref 15–41)
Albumin: 3.6 g/dL (ref 3.5–5.0)
Alkaline Phosphatase: 84 U/L (ref 38–126)
Anion gap: 7 (ref 5–15)
BUN: 15 mg/dL (ref 6–20)
CO2: 27 mmol/L (ref 22–32)
Calcium: 9 mg/dL (ref 8.9–10.3)
Chloride: 104 mmol/L (ref 98–111)
Creatinine, Ser: 0.45 mg/dL (ref 0.44–1.00)
GFR, Estimated: >60 mL/min (ref >60)
Glucose, Bld: 101 mg/dL — ABNORMAL HIGH (ref 70–99)
Potassium: 4.4 mmol/L (ref 3.5–5.1)
Sodium: 138 mmol/L (ref 135–145)
Total Bilirubin: 0.3 mg/dL (ref 0.3–1.2)
Total Protein: 7.1 g/dL (ref 6.5–8.1)

## 2022-05-08 LAB — CBC WITH DIFFERENTIAL/PLATELET
Abs Immature Granulocytes: 0.02 10*3/uL (ref 0.00–0.07)
Basophils Absolute: 0.1 10*3/uL (ref 0.0–0.1)
Basophils Relative: 1 %
Eosinophils Absolute: 1 10*3/uL — ABNORMAL HIGH (ref 0.0–0.5)
Eosinophils Relative: 10 %
HCT: 37.7 % (ref 36.0–46.0)
Hemoglobin: 12.2 g/dL (ref 12.0–15.0)
Immature Granulocytes: 0 %
Lymphocytes Relative: 24 %
Lymphs Abs: 2.3 10*3/uL (ref 0.7–4.0)
MCH: 28.8 pg (ref 26.0–34.0)
MCHC: 32.4 g/dL (ref 30.0–36.0)
MCV: 88.9 fL (ref 80.0–100.0)
Monocytes Absolute: 0.6 10*3/uL (ref 0.1–1.0)
Monocytes Relative: 6 %
Neutro Abs: 5.7 10*3/uL (ref 1.7–7.7)
Neutrophils Relative %: 59 %
Platelets: 315 10*3/uL (ref 150–400)
RBC: 4.24 MIL/uL (ref 3.87–5.11)
RDW: 13 % (ref 11.5–15.5)
WBC: 9.7 10*3/uL (ref 4.0–10.5)
nRBC: 0 % (ref 0.0–0.2)

## 2022-05-08 LAB — URINALYSIS, ROUTINE W REFLEX MICROSCOPIC
Bilirubin Urine: NEGATIVE
Glucose, UA: NEGATIVE mg/dL
Hgb urine dipstick: NEGATIVE
Ketones, ur: NEGATIVE mg/dL
Leukocytes,Ua: NEGATIVE
Nitrite: NEGATIVE
Protein, ur: NEGATIVE mg/dL
Specific Gravity, Urine: 1.016 (ref 1.005–1.030)
pH: 8 (ref 5.0–8.0)

## 2022-05-08 LAB — POCT URINE PREGNANCY: Preg Test, Ur: NEGATIVE

## 2022-05-08 MED ORDER — KETOROLAC TROMETHAMINE 15 MG/ML IJ SOLN
15.0000 mg | Freq: Once | INTRAMUSCULAR | Status: AC
  Administered 2022-05-08: 15 mg via INTRAVENOUS
  Filled 2022-05-08: qty 1

## 2022-05-08 MED ORDER — IBUPROFEN 800 MG PO TABS
800.0000 mg | ORAL_TABLET | Freq: Three times a day (TID) | ORAL | 0 refills | Status: DC
  Filled 2022-06-16: qty 21, 7d supply, fill #0

## 2022-05-08 MED ORDER — ONDANSETRON HCL 4 MG/2ML IJ SOLN
4.0000 mg | Freq: Once | INTRAMUSCULAR | Status: AC
  Administered 2022-05-08: 4 mg via INTRAVENOUS
  Filled 2022-05-08: qty 2

## 2022-05-08 MED ORDER — LACTATED RINGERS IV BOLUS
1000.0000 mL | Freq: Once | INTRAVENOUS | Status: AC
  Administered 2022-05-08: 1000 mL via INTRAVENOUS

## 2022-05-08 MED ORDER — ONDANSETRON 4 MG PO TBDP
4.0000 mg | ORAL_TABLET | Freq: Three times a day (TID) | ORAL | 0 refills | Status: DC | PRN
  Filled 2022-06-16: qty 20, 7d supply, fill #0

## 2022-05-08 MED ORDER — LIDOCAINE 5 % EX PTCH: 1.0000 | MEDICATED_PATCH | Freq: Two times a day (BID) | CUTANEOUS | 0 refills | Status: AC

## 2022-05-08 MED ORDER — ONDANSETRON 4 MG PO TBDP
4.0000 mg | ORAL_TABLET | Freq: Once | ORAL | Status: AC
  Administered 2022-05-08: 4 mg via ORAL

## 2022-05-08 MED ORDER — DOXAZOSIN MESYLATE 4 MG PO TABS
4.0000 mg | ORAL_TABLET | Freq: Every day | ORAL | 0 refills | Status: AC
  Filled 2022-06-16: qty 10, 10d supply, fill #0

## 2022-05-08 NOTE — ED Triage Notes (Signed)
Pt sts that she went to the [facility] and advised her to come to the ED due to her having a kidney stone and there is nothing they can do for her over there.

## 2022-05-08 NOTE — ED Provider Notes (Signed)
-----------------------------------------   7:42 PM on 05/08/2022 -----------------------------------------  Blood pressure 127/71, pulse 65, temperature 98.4 F (36.9 C), temperature source Oral, resp. rate 18, weight (!) 138.3 kg, last menstrual period 04/08/2022, SpO2 98 %.  Assuming care from Dr. Starleen Blue.  In short, Catherine Mitchell is a 37 y.o. female with a chief complaint of Flank Pain .  Refer to the original H&P for additional details.  The current plan of care is to follow-up CMP results for flank pain, suspect MSK source.  ----------------------------------------- 8:43 PM on 05/08/2022 ----------------------------------------- CMP is unremarkable, patient reports feeling better on reassessment.  She is appropriate for discharge home with PCP follow-up, will be prescribed Lidoderm patches for suspected musculoskeletal flank pain.  She was counseled to return to the ED for new or worsening symptoms, patient agrees with plan.    Blake Divine, MD 05/08/22 2044

## 2022-05-08 NOTE — ED Notes (Signed)
Patient is being discharged from the Urgent Care and sent to the Emergency Department via [facility] . Per provider Lodema Hong., patient is in need of higher level of care due to possible kidney stones. Patient is aware and verbalizes understanding of plan of care.  Vitals:   05/08/22 1610  BP: 117/81  Pulse: 67  Resp: 20  Temp: 98.1 F (36.7 C)  SpO2: 97%

## 2022-05-08 NOTE — ED Triage Notes (Signed)
Pt states having pain in lower/mid back that last night, with lower abdominal cramping and nausea, dark/brown color urine with decreased urine output per patient. Does have a history of kidney stones.

## 2022-05-08 NOTE — ED Provider Notes (Signed)
[facility] Provider Note    Event Date/Time   First MD Initiated Contact with Patient 05/08/22 1823     (approximate)   History   Flank Pain   HPI  Catherine Mitchell is a 37 y.o. female past medical history of kidney stones who presents with right flank pain.  Symptoms started acutely around 2 PM.  Pain is located in the right mid to low back and radiates around to the right abdomen.  She does have dysuria and urgency no frequency.  No fevers has had nausea and some retching but no vomiting.  Feels similar to prior kidney stones.     Past Medical History:  Diagnosis Date   Anxiety    Depression    HSV (herpes simplex virus) anogenital infection    Hypoglycemia    Kidney stone    Vaginal Pap smear, abnormal     Patient Active Problem List   Diagnosis Date Noted   Opioid use disorder 09/12/2021   PTSD (post-traumatic stress disorder) 09/12/2021   Severe recurrent major depression without psychotic features 09/11/2021   MDD (major depressive disorder), recurrent episode, severe 09/10/2021   Suicidal behavior with attempted self-injury 09/10/2021   Intentional overdose    Kidney stone 11/16/2018   Yeast vaginitis 12/17/2016   H N P-CERVICAL 09/18/2008   Sprain of neck 08/30/2008     Physical Exam  Triage Vital Signs: ED Triage Vitals  Enc Vitals Group     BP 05/08/22 1811 127/71     Pulse Rate 05/08/22 1811 65     Resp 05/08/22 1811 18     Temp 05/08/22 1811 98.4 F (36.9 C)     Temp Source 05/08/22 1811 Oral     SpO2 05/08/22 1811 98 %     Weight 05/08/22 1814 (!) 305 lb (138.3 kg)     Height --      Head Circumference --      Peak Flow --      Pain Score 05/08/22 1814 10     Pain Loc --      Pain Edu? --      Excl. in GC? --     Most recent vital signs: Vitals:   05/08/22 1811 05/08/22 2100  BP: 127/71 129/79  Pulse: 65 63  Resp: 18 18  Temp: 98.4 F (36.9 C)   SpO2: 98% 98%     General: Awake, no distress.   CV:  Good peripheral perfusion.  Resp:  Normal effort.  Abd:  No distention.  Abdomen is soft no significant tenderness in the lower quadrants Neuro:             Awake, Alert, Oriented x 3  Other:  + Right CVA tenderness as well as right paraspinal lower lumbar tenderness to palpation   ED Results / Procedures / Treatments  Labs (all labs ordered are listed, but only abnormal results are displayed) Labs Reviewed  URINALYSIS, ROUTINE W REFLEX MICROSCOPIC - Abnormal; Notable for the following components:      Result Value   Color, Urine YELLOW (*)    APPearance CLOUDY (*)    All other components within normal limits  COMPREHENSIVE METABOLIC PANEL - Abnormal; Notable for the following components:   Glucose, Bld 101 (*)    All other components within normal limits  CBC WITH DIFFERENTIAL/PLATELET - Abnormal; Notable for the following components:   Eosinophils Absolute 1.0 (*)    All other components within normal limits  EKG     RADIOLOGY  CT renal study reviewed interpreted myself is negative for obstructing stone  PROCEDURES:  Critical Care performed: No  Procedures   MEDICATIONS ORDERED IN ED: Medications  ketorolac (TORADOL) 15 MG/ML injection 15 mg (15 mg Intravenous Given 05/08/22 1921)  lactated ringers bolus 1,000 mL (0 mLs Intravenous Stopped 05/08/22 2038)  ondansetron (ZOFRAN) injection 4 mg (4 mg Intravenous Given 05/08/22 1921)     IMPRESSION / MDM / ASSESSMENT AND PLAN / ED COURSE  I reviewed the triage vital signs and the nursing notes.                              Patient's presentation is most consistent with acute complicated illness / injury requiring diagnostic workup.  Differential diagnosis includes, but is not limited to, kidney stone, musculoskeletal pain, pyelonephritis  Patient is a 37 year old female who presents with right-sided flank pain that started acutely at 2 PM today.  Pain is located right mid to low back with radiation around to  the front of the abdomen and associated with urinary urgency and dysuria.  She has had nausea but no vomiting.  Patient is afebrile here vitals are reassuring overall she looks well not in distress.  Abdominal exam is benign.  Does have some right CVA tenderness as well as some lower paraspinal right-sided lumbar tenderness.  Urinalysis does not have any red cells or white cells.  Pregnancy test from urgent care was negative.  Will obtain a CT renal study as well as CBC CMP give bolus of fluid Zofran and Toradol.  Fact that she has no hematuria makes kidney stone less likely although fully obstructing stone could present without any hematuria CT does not show any obstructing stone.  Labs currently pending.  This points to more likely musculoskeletal pain.  Signed out to oncoming rider pending CMP results.         FINAL CLINICAL IMPRESSION(S) / ED DIAGNOSES   Final diagnoses:  Flank pain     Rx / DC Orders   ED Discharge Orders          Ordered    lidocaine (LIDODERM) 5 %  Every 12 hours        05/08/22 2043             Note:  This document was prepared using Dragon voice recognition software and may include unintentional dictation errors.   Georga Hacking, MD 05/10/22 [phone]

## 2022-05-08 NOTE — ED Provider Notes (Addendum)
[name]    CSN: [id number] Arrival date & time: 05/08/22  1528      History   Chief Complaint Chief Complaint  Patient presents with   Back Pain    Kidney stone and having severe pain can't stand it - Entered by patient    HPI Catherine Mitchell is a 37 y.o. female.   Patient presents today with a several hour history of right flank and abdominal pain.  She reports that pain is rated 9/10 on a 0-10 pain scale, described as sharp, no aggravating relieving factors identified.  Pain is intermittent without identifiable trigger.  She does have a history of kidney stones with similar presentation.  She has never had to have them removed or had any surgical procedure to address them.  She has not seen a urologist recently but did follow-up with them for several years.  She has tried ibuprofen 800 with last dose approximately 45 minutes ago.  She has not taken additional over-the-counter medication.  Reports associated dysuria, hematuria, frequency, urgency.  Denies any recent urogenital procedure or self-catheterization.  Denies any recent antibiotics.    Past Medical History:  Diagnosis Date   Anxiety    Depression    HSV (herpes simplex virus) anogenital infection    Hypoglycemia    Kidney stone    Vaginal Pap smear, abnormal     Patient Active Problem List   Diagnosis Date Noted   Opioid use disorder 09/12/2021   PTSD (post-traumatic stress disorder) 09/12/2021   Severe recurrent major depression without psychotic features 09/11/2021   MDD (major depressive disorder), recurrent episode, severe 09/10/2021   Suicidal behavior with attempted self-injury 09/10/2021   Intentional overdose    Kidney stone 11/16/2018   Yeast vaginitis 12/17/2016   H N P-CERVICAL 09/18/2008   Sprain of neck 08/30/2008    Past Surgical History:  Procedure Laterality Date   INTRAUTERINE DEVICE INSERTION  04/20/2013   ParaGard   nexplanon removal  2015   URETHRA SURGERY  had it  stretched as a child   WISDOM TOOTH EXTRACTION      OB History   No obstetric history on file.      Home Medications    Prior to Admission medications   Medication Sig Start Date End Date Taking? Authorizing Provider  carbamazepine (TEGRETOL) 200 MG tablet Take 1 tablet (200 mg total) by mouth 2 (two) times daily. 09/19/21  Yes Clapacs, Madie Reno, MD  cetirizine-pseudoephedrine (ZYRTEC-D) 5-120 MG tablet Take 1 tablet by mouth 2 (two) times daily. 11/25/19  Yes Cook, Jayce G, DO  clonazePAM (KLONOPIN) 0.5 MG tablet Take 1 tablet (0.5 mg total) by mouth 3 (three) times daily. 09/19/21  Yes Clapacs, Madie Reno, MD  clonazePAM (KLONOPIN) 1 MG tablet Take 1 mg by mouth 3 (three) times daily.   Yes [provider]  doxazosin (CARDURA) 4 MG tablet Take 1 tablet (4 mg total) by mouth daily. 05/08/22  Yes Beya Tipps K, PA-C  ibuprofen (ADVIL) 800 MG tablet Take 1 tablet (800 mg total) by mouth every 8 (eight) hours as needed for mild pain. 05/02/21  Yes Ward, Delice Bison, DO  ibuprofen (ADVIL) 800 MG tablet Take 1 tablet (800 mg total) by mouth 3 (three) times daily. 05/08/22  Yes Jonita Hirota K, PA-C  ondansetron (ZOFRAN-ODT) 4 MG disintegrating tablet Take 1 tablet (4 mg total) by mouth every 8 (eight) hours as needed for nausea or vomiting. 05/08/22  Yes Sheronda Parran, Derry Skill, PA-C  venlafaxine XR (EFFEXOR-XR) 75 MG 24 hr capsule Take 3 capsules (225 mg total) by mouth daily with breakfast. 09/19/21  Yes Clapacs, Madie Reno, MD  venlafaxine XR (EFFEXOR-XR) 75 MG 24 hr capsule Take 150 mg by mouth daily with breakfast.     [provider]  norethindrone-ethinyl estradiol (JUNEL FE,GILDESS FE,LOESTRIN FE) 1-20 MG-MCG tablet Take 1 tablet by mouth daily. 08/18/16 07/27/18  Huel Cote, NP  Norethindrone-Ethinyl Estradiol-Fe Biphas (LO LOESTRIN FE) 1 MG-10 MCG / 10 MCG tablet Take 1 tablet by mouth daily. 01/04/19 04/18/19  Huel Cote, NP  topiramate (TOPAMAX) 50 MG tablet Take 50 mg by mouth daily.   07/27/18  [provider]    Family History Family History  Problem Relation Age of Onset   Diabetes Mother    Thyroid disease Mother    Diabetes Maternal Grandmother    Hypertension Father     Social History Social History   Tobacco Use   Smoking status: Never   Smokeless tobacco: Current  Vaping Use   Vaping Use: Never used  Substance Use Topics   Alcohol use: Not Currently   Drug use: Yes    Types: Marijuana     Allergies   Elemental sulfur, Sulfa antibiotics, Latex, Sulfa antibiotics, Sulfa antibiotics, and Tomato   Review of Systems Review of Systems  Constitutional:  Positive for activity change. Negative for appetite change, fatigue and fever.  Respiratory:  Negative for cough and shortness of breath.   Cardiovascular:  Negative for chest pain.  Gastrointestinal:  Positive for abdominal pain. Negative for diarrhea, nausea and vomiting.  Genitourinary:  Positive for dysuria, flank pain, hematuria and urgency. Negative for frequency, vaginal bleeding, vaginal discharge and vaginal pain.     Physical Exam Triage Vital Signs ED Triage Vitals [05/08/22 1610]  Enc Vitals Group     BP 117/81     Pulse Rate 67     Resp 20     Temp 98.1 F (36.7 C)     Temp Source Oral     SpO2 97 %     Weight      Height      Head Circumference      Peak Flow      Pain Score 10     Pain Loc      Pain Edu?      Excl. in [place]?    No data found.  Updated Vital Signs BP 117/81 (BP Location: Right Arm)   Pulse 67   Temp 98.1 F (36.7 C) (Oral)   Resp 20   LMP 04/08/2022 (Approximate)   SpO2 97%   Visual Acuity Right Eye Distance:   Left Eye Distance:   Bilateral Distance:    Right Eye Near:   Left Eye Near:    Bilateral Near:     Physical Exam Vitals reviewed.  Constitutional:      General: She is awake. She is not in acute distress.    Appearance: Normal appearance. She is well-developed. She is not ill-appearing.     Comments: Very pleasant  female who stated age in no acute distress sitting comfortably in exam room  HENT:     Head: Normocephalic and atraumatic.  Cardiovascular:     Rate and Rhythm: Normal rate and regular rhythm.     Heart sounds: Normal heart sounds, S1 normal and S2 normal. No murmur heard. Pulmonary:     Effort: Pulmonary effort is normal.     Breath sounds: Normal  breath sounds. No wheezing, rhonchi or rales.     Comments: Clear to auscultation bilaterally Abdominal:     General: Bowel sounds are normal.     Palpations: Abdomen is soft.     Tenderness: There is no abdominal tenderness. There is right CVA tenderness. There is no left CVA tenderness, guarding or rebound.  Psychiatric:        Behavior: Behavior is cooperative.      UC Treatments / Results  Labs (all labs ordered are listed, but only abnormal results are displayed) Labs Reviewed  POCT URINALYSIS DIP (MANUAL ENTRY) - Abnormal; Notable for the following components:      Result Value   Clarity, UA cloudy (*)    All other components within normal limits  URINE CULTURE  POCT URINE PREGNANCY    EKG   Radiology No results found.  Procedures Procedures (including critical care time)  Medications Ordered in UC Medications  ondansetron (ZOFRAN-ODT) disintegrating tablet 4 mg (4 mg Oral Given 05/08/22 1646)    Initial Impression / Assessment and Plan / UC Course  I have reviewed the triage vital signs and the nursing notes.  Pertinent labs & imaging results that were available during my care of the patient were reviewed by me and considered in my medical decision making (see chart for details).     Patient is nontoxic, afebrile, nontachycardic.  Urine pregnancy was negative.  UA was cloudy but otherwise no hematuria or evidence of infection.  She did have significant CVA tenderness.  Unfortunately, we are unable to obtain a KUB given our limited resources and do not have any advanced imaging.  We discussed that the safest thing  to do would be to go to the emergency room for further evaluation and management but she declined to do this.  Will treat empirically for a kidney stone.  Unfortunately, we were unable to give her any Toradol as she had just taken a dose of ibuprofen 800 mg before coming here.  She was provided a refill of this medication to be used for pain management with instruction not to take additional NSAIDs with this medicine.  She was given Zofran in clinic with improvement of symptoms.  This was sent to the pharmacy with instruction to use this on a scheduled basis for the next several days.  She is unable to take tamsulosin so we will use doxazosin to help with kidney stone expulsion.  This can cause hypotension and she was instructed to take this just at night.  Basic labs including CBC and CMP were obtained today and if she has significant leukocytosis or acute kidney injury she will need to go to the emergency room for further evaluation and management.  We discussed that if her symptoms or not improving quickly or if at any point anything changes she should go to the ER to which she expressed understanding.  All questions were answered to patient and husband satisfaction.  Work excuse note was provided.  Addendum: We were unable to get blood work after multiple attempts.  Discussed that it is possible she is dehydrated and again encouraged her to go to the emergency room.  She is going to consider this but agrees that if symptoms do not improve or worsen she will go.  Final Clinical Impressions(s) / UC Diagnoses   Final diagnoses:  Right flank pain  History of nephrolithiasis     Discharge Instructions      I am concerned about the severity of your symptoms.  As we discussed, the safest thing to do was to go to the emergency room.  Since we are not doing that right now, we are going to start the medication to help with your symptoms.  Please take Zofran every 8 hours as needed for nausea and vomiting.   Make sure that you are drinking plenty of fluid.  Take ibuprofen 800 mg for pain.  Do not take additional NSAIDs with this medication including aspirin, ibuprofen/Advil, naproxen/Aleve.  Take doxazosin at night to help pass the kidney stone.  This can make you lightheaded and have low blood pressure so please take your before you go to bed.  If anything worsens you should go to the emergency room immediately.  Assuming your symptoms improve please follow-up with urology; call to schedule an appointment.  We will contact you if your lab work is abnormal.     ED Prescriptions     Medication Sig Dispense Auth. Provider   ondansetron (ZOFRAN-ODT) 4 MG disintegrating tablet Take 1 tablet (4 mg total) by mouth every 8 (eight) hours as needed for nausea or vomiting. 20 tablet Hamlin Devine K, PA-C   doxazosin (CARDURA) 4 MG tablet Take 1 tablet (4 mg total) by mouth daily. 10 tablet Nicolaas Savo K, PA-C   ibuprofen (ADVIL) 800 MG tablet Take 1 tablet (800 mg total) by mouth 3 (three) times daily. 21 tablet Criag Wicklund, Derry Skill, PA-C      PDMP not reviewed this encounter.   Terrilee Croak, PA-C 05/08/22 1711    Larnie Heart, Derry Skill, PA-C 05/08/22 1753

## 2022-05-08 NOTE — Discharge Instructions (Addendum)
I am concerned about the severity of your symptoms.  As we discussed, the safest thing to do was to go to the emergency room.  Since we are not doing that right now, we are going to start the medication to help with your symptoms.  Please take Zofran every 8 hours as needed for nausea and vomiting.  Make sure that you are drinking plenty of fluid.  Take ibuprofen 800 mg for pain.  Do not take additional NSAIDs with this medication including aspirin, ibuprofen/Advil, naproxen/Aleve.  Take doxazosin at night to help pass the kidney stone.  This can make you lightheaded and have low blood pressure so please take your before you go to bed.  If anything worsens you should go to the emergency room immediately.  Assuming your symptoms improve please follow-up with urology; call to schedule an appointment.  We will contact you if your lab work is abnormal.

## 2022-05-09 LAB — URINE CULTURE: Culture: NO GROWTH

## 2022-06-16 ENCOUNTER — Other Ambulatory Visit (HOSPITAL_BASED_OUTPATIENT_CLINIC_OR_DEPARTMENT_OTHER): Payer: Self-pay

## 2022-06-16 MED ORDER — GABAPENTIN 300 MG PO CAPS
600.0000 mg | ORAL_CAPSULE | Freq: Three times a day (TID) | ORAL | 11 refills | Status: AC
  Filled 2022-06-16: qty 180, 30d supply, fill #0

## 2022-06-16 MED ORDER — VENLAFAXINE HCL ER 75 MG PO CP24
225.0000 mg | ORAL_CAPSULE | Freq: Every day | ORAL | 9 refills | Status: DC
  Filled 2022-06-16: qty 90, 30d supply, fill #0

## 2022-06-16 MED ORDER — HYDROXYZINE HCL 50 MG PO TABS
50.0000 mg | ORAL_TABLET | Freq: Three times a day (TID) | ORAL | 11 refills | Status: AC | PRN
  Filled 2022-06-16: qty 90, 30d supply, fill #0

## 2022-06-17 ENCOUNTER — Other Ambulatory Visit: Payer: Self-pay

## 2022-06-17 ENCOUNTER — Other Ambulatory Visit (HOSPITAL_BASED_OUTPATIENT_CLINIC_OR_DEPARTMENT_OTHER): Payer: Self-pay

## 2022-06-18 ENCOUNTER — Other Ambulatory Visit (HOSPITAL_BASED_OUTPATIENT_CLINIC_OR_DEPARTMENT_OTHER): Payer: Self-pay

## 2022-06-18 ENCOUNTER — Other Ambulatory Visit: Payer: Self-pay

## 2022-07-31 ENCOUNTER — Ambulatory Visit: Payer: 59

## 2022-07-31 ENCOUNTER — Ambulatory Visit: Payer: Medicaid Other

## 2022-07-31 ENCOUNTER — Ambulatory Visit (HOSPITAL_COMMUNITY)
Admission: RE | Admit: 2022-07-31 | Discharge: 2022-07-31 | Disposition: A | Discharge status: Home / Self Care | Payer: BLUE CROSS/BLUE SHIELD | Source: Ambulatory Visit | Attending: Family Medicine | Admitting: Family Medicine

## 2022-07-31 ENCOUNTER — Encounter (HOSPITAL_COMMUNITY): Payer: Self-pay

## 2022-07-31 ENCOUNTER — Other Ambulatory Visit: Payer: Self-pay

## 2022-07-31 VITALS — BP 141/74 | HR 64 | Temp 97.8°F | Resp 22

## 2022-07-31 DIAGNOSIS — Z20822 Contact with and (suspected) exposure to covid-19: Secondary | ICD-10-CM | POA: Diagnosis not present

## 2022-07-31 DIAGNOSIS — B9789 Other viral agents as the cause of diseases classified elsewhere: Secondary | ICD-10-CM | POA: Diagnosis not present

## 2022-07-31 DIAGNOSIS — J22 Unspecified acute lower respiratory infection: Secondary | ICD-10-CM | POA: Diagnosis not present

## 2022-07-31 MED ORDER — PREDNISONE 20 MG PO TABS: 20.0000 mg | ORAL_TABLET | Freq: Every day | ORAL | 0 refills | Status: AC

## 2022-07-31 MED ORDER — ONDANSETRON 4 MG PO TBDP
ORAL_TABLET | ORAL | Status: AC
  Filled 2022-07-31: qty 1

## 2022-07-31 MED ORDER — PREDNISONE 20 MG PO TABS: 40.0000 mg | ORAL_TABLET | Freq: Every day | ORAL | 0 refills | Status: DC

## 2022-07-31 MED ORDER — ONDANSETRON 4 MG PO TBDP
4.0000 mg | ORAL_TABLET | Freq: Once | ORAL | Status: AC
  Administered 2022-07-31: 4 mg via ORAL

## 2022-07-31 MED ORDER — PROMETHAZINE-DM 6.25-15 MG/5ML PO SYRP: 5.0000 mL | ORAL_SOLUTION | Freq: Four times a day (QID) | ORAL | 0 refills | Status: DC | PRN

## 2022-07-31 MED ORDER — ALBUTEROL SULFATE HFA 108 (90 BASE) MCG/ACT IN AERS: 2.0000 | INHALATION_SPRAY | Freq: Four times a day (QID) | RESPIRATORY_TRACT | 0 refills | Status: DC | PRN

## 2022-07-31 MED ORDER — CETIRIZINE HCL 10 MG PO TABS: 10.0000 mg | ORAL_TABLET | Freq: Every day | ORAL | 0 refills | Status: AC

## 2022-07-31 NOTE — ED Triage Notes (Signed)
Complains of cough, wheezing, deep cough, phlegm is dark yellow/green, reports fever 102, complains of fluid behind ears and congested  Patient reports waking up Sunday with cough.  Patient performed a covid test at home today and it is positive.

## 2022-07-31 NOTE — Discharge Instructions (Addendum)
Given duration of your COVID symptoms for 5 days, antivirals are currently not indicated.   Start prednisone 20 mg once daily for 5 days.  Start prednisone tomorrow morning as this can cause severe insomnia.  Take with breakfast every morning for 5 days. Promethazine DM for cough you may take this medication 4 times daily.  Start Zyrtec and take daily.  I have refilled your albuterol inhaler 2 puffs every 4-6 hours as needed for wheezing, shortness of breath and/or chest tightness.  If you develop any severe chest heaviness severe shortness of breath or breathlessness go immediately to the emergency department.

## 2022-07-31 NOTE — ED Provider Notes (Signed)
[facility]    CSN: [id number] Arrival date & time: 07/31/22  1758      History   Chief Complaint Chief Complaint  Patient presents with   Appointment    18:30   Cough   positive covid test    HPI Catherine Mitchell is a 37 y.o. female.   HPI Patient presents with URI symptoms including productive, inner ear congestion, fever, wheezing (hx of asthma), nasal congestion, runny nose, and sinus pressure.Tested positive today per patient with home COVID test. Fever TMAX 102 F. She has not taken any medication to treat her upper respiratory symptoms she was unable to get out and purchase medicine.  She reports she has been taking Tylenol every 8 hours to control fever.  She is currently afebrile.  Patient is currently not wheezing but endorses with laying down at nighttime is when her symptoms worsen she experiences wheezing and a worsening cough.  She is currently out of her albuterol inhaler and questing refill.  She is a non-smoker. Past Medical History:  Diagnosis Date   Anxiety    Depression    HSV (herpes simplex virus) anogenital infection    Hypoglycemia    Kidney stone    Vaginal Pap smear, abnormal     Patient Active Problem List   Diagnosis Date Noted   Opioid use disorder 09/12/2021   PTSD (post-traumatic stress disorder) 09/12/2021   Severe recurrent major depression without psychotic features (HCC) 09/11/2021   MDD (major depressive disorder), recurrent episode, severe (HCC) 09/10/2021   Suicidal behavior with attempted self-injury (HCC) 09/10/2021   Intentional overdose (HCC)    Kidney stone 11/16/2018   Yeast vaginitis 12/17/2016   H N P-CERVICAL 09/18/2008   Sprain of neck 08/30/2008    Past Surgical History:  Procedure Laterality Date   INTRAUTERINE DEVICE INSERTION  04/20/2013   ParaGard   nexplanon removal  2015   URETHRA SURGERY  had it stretched as a child   WISDOM TOOTH EXTRACTION      OB History   No obstetric history on file.       Home Medications    Prior to Admission medications   Medication Sig Start Date End Date Taking? Authorizing Provider  albuterol (VENTOLIN HFA) 108 (90 Base) MCG/ACT inhaler Inhale 2 puffs into the lungs every 6 (six) hours as needed for wheezing or shortness of breath. 07/31/22  Yes Bing Neighbors, NP  cetirizine (ZYRTEC) 10 MG tablet Take 1 tablet (10 mg total) by mouth daily. 07/31/22  Yes Bing Neighbors, NP  promethazine-dextromethorphan (PROMETHAZINE-DM) 6.25-15 MG/5ML syrup Take 5 mLs by mouth 4 (four) times daily as needed for cough. 07/31/22  Yes Bing Neighbors, NP  carbamazepine (TEGRETOL) 200 MG tablet Take 1 tablet (200 mg total) by mouth 2 (two) times daily. Patient not taking: Reported on 07/31/2022 09/19/21   Clapacs, [name], MD  clonazePAM (KLONOPIN) 0.5 MG tablet Take 1 tablet (0.5 mg total) by mouth 3 (three) times daily. 09/19/21   Clapacs, [name], MD  clonazePAM (KLONOPIN) 1 MG tablet Take 1 mg by mouth 3 (three) times daily.    [provider]  doxazosin (CARDURA) 4 MG tablet Take 1 tablet (4 mg total) by mouth daily. Patient not taking: Reported on 07/31/2022 05/08/22   Raspet, Denny Peon K, PA-C  gabapentin (NEURONTIN) 300 MG capsule Take 2 capsules (600 mg total) by mouth 3 (three) times daily. 04/30/22     hydrOXYzine (ATARAX) 50 MG tablet Take 1 tablet (  50 mg total) by mouth 3 (three) times daily as needed. 04/30/22     ibuprofen (ADVIL) 800 MG tablet Take 1 tablet (800 mg total) by mouth every 8 (eight) hours as needed for mild pain. 05/02/21   Ward, Layla Maw, DO  ibuprofen (ADVIL) 800 MG tablet Take 1 tablet (800 mg total) by mouth 3 (three) times daily. 05/08/22   Raspet, Erin K, PA-C  lidocaine (LIDODERM) 5 % Place 1 patch onto the skin every 12 (twelve) hours. Remove & Discard patch within 12 hours or as directed by MD Patient not taking: Reported on 07/31/2022 05/08/22 05/08/23  Chesley Noon, MD  ondansetron (ZOFRAN-ODT) 4 MG disintegrating tablet Take 1  tablet (4 mg total) by mouth every 8 (eight) hours as needed for nausea or vomiting. 05/08/22   Raspet, Noberto Retort, PA-C  predniSONE (DELTASONE) 20 MG tablet Take 1 tablet (20 mg total) by mouth daily with breakfast for 5 days. 08/01/22 08/06/22  Bing Neighbors, NP  venlafaxine XR (EFFEXOR-XR) 75 MG 24 hr capsule Take 150 mg by mouth daily with breakfast.     [provider]  venlafaxine XR (EFFEXOR-XR) 75 MG 24 hr capsule Take 3 capsules (225 mg total) by mouth daily with breakfast. 09/19/21   Clapacs, [name], MD  venlafaxine XR (EFFEXOR-XR) 75 MG 24 hr capsule Take 3 capsules (225 mg total) by mouth daily. 02/18/22     norethindrone-ethinyl estradiol (JUNEL FE,GILDESS FE,LOESTRIN FE) 1-20 MG-MCG tablet Take 1 tablet by mouth daily. 08/18/16 07/27/18  Harrington Challenger, NP  Norethindrone-Ethinyl Estradiol-Fe Biphas (LO LOESTRIN FE) 1 MG-10 MCG / 10 MCG tablet Take 1 tablet by mouth daily. 01/04/19 04/18/19  Harrington Challenger, NP  topiramate (TOPAMAX) 50 MG tablet Take 50 mg by mouth daily.  07/27/18  [provider]    Family History Family History  Problem Relation Age of Onset   Diabetes Mother    Thyroid disease Mother    Diabetes Maternal Grandmother    Hypertension Father     Social History Social History   Tobacco Use   Smoking status: Never   Smokeless tobacco: Never  Vaping Use   Vaping Use: Never used  Substance Use Topics   Alcohol use: Not Currently   Drug use: Yes    Types: Marijuana     Allergies   Elemental sulfur, Sulfa antibiotics, Latex, Sulfa antibiotics, Sulfa antibiotics, and Tomato   Review of Systems Review of Systems Pertinent negatives listed in HPI  Physical Exam Triage Vital Signs ED Triage Vitals  Enc Vitals Group     BP 07/31/22 1828 (!) 141/74     Pulse Rate 07/31/22 1828 64     Resp 07/31/22 1828 (!) 22     Temp 07/31/22 1828 97.8 F (36.6 C)     Temp Source 07/31/22 1828 Oral     SpO2 07/31/22 1828 96 %     Weight --      Height --       Head Circumference --      Peak Flow --      Pain Score 07/31/22 1823 10     Pain Loc --      Pain Edu? --      Excl. in [place]? --    No data found.  Updated Vital Signs BP (!) 141/74 (BP Location: Left Arm) Comment (BP Location): regular cuff on left forearm  Pulse 64   Temp 97.8 F (36.6 C) (Oral)   Resp (!) 22  LMP 07/16/2022   SpO2 96%   Visual Acuity Right Eye Distance:   Left Eye Distance:   Bilateral Distance:    Right Eye Near:   Left Eye Near:    Bilateral Near:     Physical Exam Vitals reviewed.  Constitutional:      Appearance: Normal appearance.  HENT:     Head: Normocephalic and atraumatic.     Right Ear: There is no impacted cerumen.     Left Ear: There is no impacted cerumen.     Nose: Congestion and rhinorrhea present.  Eyes:     Extraocular Movements: Extraocular movements intact.     Pupils: Pupils are equal, round, and reactive to light.  Cardiovascular:     Rate and Rhythm: Normal rate and regular rhythm.  Pulmonary:     Effort: Pulmonary effort is normal.     Breath sounds: Rhonchi present. No wheezing or rales.  Musculoskeletal:     Cervical back: Normal range of motion.  Lymphadenopathy:     Cervical: No cervical adenopathy.  Skin:    General: Skin is warm and dry.  Neurological:     General: No focal deficit present.     Mental Status: She is alert.    UC Treatments / Results  Labs (all labs ordered are listed, but only abnormal results are displayed) Labs Reviewed - No data to display  EKG   Radiology No results found.  Procedures Procedures (including critical care time)  Medications Ordered in UC Medications  ondansetron (ZOFRAN-ODT) disintegrating tablet 4 mg (4 mg Oral Given 07/31/22 1851)    Initial Impression / Assessment and Plan / UC Course  I have reviewed the triage vital signs and the nursing notes.  Pertinent labs & imaging results that were available during my care of the patient were reviewed by me  and considered in my medical decision making (see chart for details).    COVID 19 test unknown result here in clinic however patient reports home test was positive. Viral infection of the lower respiratory system, possibly related to COVID unable to confirm.  Patient has had symptoms for 5 days therefore antiviral treatment would not be beneficial as patient is nearing the acute phase of her illness.  Treatment per discharge medication orders.  Work note provided and patient advised not to return to work until she is afebrile.  She is currently been taking Tylenol around-the-clock advised to discontinue Tylenol as she is currently afebrile here in clinic and only take Tylenol if temp rises above 100F.  ER precautions given.  Patient verbalized understanding agreement with plan. Final Clinical Impressions(s) / UC Diagnoses   Final diagnoses:  COVID-19 virus test result unknown-positive per home test  Viral infection of lower respiratory system     Discharge Instructions      Given duration of your COVID symptoms for 5 days, antivirals are currently not indicated.   Start prednisone 20 mg once daily for 5 days.  Start prednisone tomorrow morning as this can cause severe insomnia.  Take with breakfast every morning for 5 days. Promethazine DM for cough you may take this medication 4 times daily.  Start Zyrtec and take daily.  I have refilled your albuterol inhaler 2 puffs every 4-6 hours as needed for wheezing, shortness of breath and/or chest tightness.  If you develop any severe chest heaviness severe shortness of breath or breathlessness go immediately to the emergency department.      ED Prescriptions     Medication  Sig Dispense Auth. Provider   cetirizine (ZYRTEC) 10 MG tablet Take 1 tablet (10 mg total) by mouth daily. 30 tablet Bing Neighbors, NP   predniSONE (DELTASONE) 20 MG tablet  (Status: Discontinued) Take 2 tablets (40 mg total) by mouth daily with breakfast for 5 days. 10  tablet Bing Neighbors, NP   promethazine-dextromethorphan (PROMETHAZINE-DM) 6.25-15 MG/5ML syrup Take 5 mLs by mouth 4 (four) times daily as needed for cough. 240 mL Bing Neighbors, NP   albuterol (VENTOLIN HFA) 108 (90 Base) MCG/ACT inhaler Inhale 2 puffs into the lungs every 6 (six) hours as needed for wheezing or shortness of breath. 8 g Bing Neighbors, NP   predniSONE (DELTASONE) 20 MG tablet Take 1 tablet (20 mg total) by mouth daily with breakfast for 5 days. 5 tablet Bing Neighbors, NP      PDMP not reviewed this encounter.   Bing Neighbors, NP 07/31/22 [phone]

## 2022-10-30 ENCOUNTER — Ambulatory Visit
Admission: RE | Admit: 2022-10-30 | Discharge: 2022-10-30 | Disposition: A | Discharge status: Home / Self Care | Payer: BLUE CROSS/BLUE SHIELD | Source: Ambulatory Visit | Attending: Emergency Medicine | Admitting: Emergency Medicine

## 2022-10-30 VITALS — BP 113/76 | HR 89 | Temp 98.1°F | Ht 68.0 in | Wt 310.0 lb

## 2022-10-30 DIAGNOSIS — H66003 Acute suppurative otitis media without spontaneous rupture of ear drum, bilateral: Secondary | ICD-10-CM

## 2022-10-30 DIAGNOSIS — J069 Acute upper respiratory infection, unspecified: Secondary | ICD-10-CM | POA: Diagnosis not present

## 2022-10-30 MED ORDER — AMOXICILLIN-POT CLAVULANATE 875-125 MG PO TABS: 1.0000 | ORAL_TABLET | Freq: Two times a day (BID) | ORAL | 0 refills | Status: AC

## 2022-10-30 MED ORDER — FLUCONAZOLE 150 MG PO TABS: 150.0000 mg | ORAL_TABLET | Freq: Once | ORAL | 0 refills | Status: AC

## 2022-10-30 NOTE — ED Triage Notes (Signed)
Patient states nasal congestion/sinus pain since Sunday, Tmax 102 two days ago, coughing up yellow/brown phlegm.  Taking OTC Mucinex and Zyrtec without much relief

## 2022-10-30 NOTE — Discharge Instructions (Signed)
Take antibiotic as directed, drink plenty of water.  Avoid smoke exposure.  May alternate Tylenol ibuprofen as label directed for weight-based dosing.  Follow-up with PCP.  Return as needed

## 2022-10-30 NOTE — ED Provider Notes (Signed)
[facility]    CSN: [id number] Arrival date & time: 10/30/22  1618      History   Chief Complaint Chief Complaint  Patient presents with   Nasal Congestion    Entered by patient    HPI Catherine Mitchell is a 37 y.o. female.   37 year old female, Catherine Mitchell, presents to urgent care for evaluation of nasal congestion sinus pain x 4 days patient states that started her head moved down to her chest and she now is coughing up yellow-brown phlegm.  Patient is taking over-the-counter Mucinex and Zyrtec without relief, patient does have passive exposure to smoke by mother.  Works at [employer].  The history is provided by the patient. No language interpreter was used.    Past Medical History:  Diagnosis Date   Anxiety    Depression    HSV (herpes simplex virus) anogenital infection    Hypoglycemia    Kidney stone    Vaginal Pap smear, abnormal     Patient Active Problem List   Diagnosis Date Noted   Non-recurrent acute suppurative otitis media of both ears without spontaneous rupture of tympanic membranes 10/30/2022   Acute upper respiratory infection 10/30/2022   Opioid use disorder 09/12/2021   PTSD (post-traumatic stress disorder) 09/12/2021   Severe recurrent major depression without psychotic features (HCC) 09/11/2021   MDD (major depressive disorder), recurrent episode, severe (HCC) 09/10/2021   Suicidal behavior with attempted self-injury (HCC) 09/10/2021   Intentional overdose (HCC)    Kidney stone 11/16/2018   Yeast vaginitis 12/17/2016   H N P-CERVICAL 09/18/2008   Sprain of neck 08/30/2008    Past Surgical History:  Procedure Laterality Date   INTRAUTERINE DEVICE INSERTION  04/20/2013   ParaGard   nexplanon removal  2015   URETHRA SURGERY  had it stretched as a child   WISDOM TOOTH EXTRACTION      OB History   No obstetric history on file.      Home Medications    Prior to Admission medications   Medication Sig Start Date End  Date Taking? Authorizing Provider  amoxicillin-clavulanate (AUGMENTIN) 875-125 MG tablet Take 1 tablet by mouth every 12 (twelve) hours for 7 days. 10/30/22 11/06/22 Yes Willette Mudry, Para March, NP  fluconazole (DIFLUCAN) 150 MG tablet Take 1 tablet (150 mg total) by mouth once for 1 dose. 10/30/22 10/30/22 Yes Jermel Artley, Para March, NP  albuterol (VENTOLIN HFA) 108 (90 Base) MCG/ACT inhaler Inhale 2 puffs into the lungs every 6 (six) hours as needed for wheezing or shortness of breath. 07/31/22   Bing Neighbors, NP  carbamazepine (TEGRETOL) 200 MG tablet Take 1 tablet (200 mg total) by mouth 2 (two) times daily. Patient not taking: Reported on 07/31/2022 09/19/21   Clapacs, [name], MD  cetirizine (ZYRTEC) 10 MG tablet Take 1 tablet (10 mg total) by mouth daily. 07/31/22   Bing Neighbors, NP  clonazePAM (KLONOPIN) 0.5 MG tablet Take 1 tablet (0.5 mg total) by mouth 3 (three) times daily. 09/19/21   Clapacs, [name], MD  clonazePAM (KLONOPIN) 1 MG tablet Take 1 mg by mouth 3 (three) times daily.    [provider]  doxazosin (CARDURA) 4 MG tablet Take 1 tablet (4 mg total) by mouth daily. Patient not taking: Reported on 07/31/2022 05/08/22   Raspet, Denny Peon K, PA-C  gabapentin (NEURONTIN) 300 MG capsule Take 2 capsules (600 mg total) by mouth 3 (three) times daily. 04/30/22     hydrOXYzine (ATARAX) 50 MG tablet Take 1  tablet (50 mg total) by mouth 3 (three) times daily as needed. 04/30/22     ibuprofen (ADVIL) 800 MG tablet Take 1 tablet (800 mg total) by mouth every 8 (eight) hours as needed for mild pain. 05/02/21   Ward, Layla Maw, DO  ibuprofen (ADVIL) 800 MG tablet Take 1 tablet (800 mg total) by mouth 3 (three) times daily. 05/08/22   Raspet, Erin K, PA-C  lidocaine (LIDODERM) 5 % Place 1 patch onto the skin every 12 (twelve) hours. Remove & Discard patch within 12 hours or as directed by MD Patient not taking: Reported on 07/31/2022 05/08/22 05/08/23  Chesley Noon, MD  ondansetron (ZOFRAN-ODT) 4 MG  disintegrating tablet Take 1 tablet (4 mg total) by mouth every 8 (eight) hours as needed for nausea or vomiting. 05/08/22   Raspet, Noberto Retort, PA-C  promethazine-dextromethorphan (PROMETHAZINE-DM) 6.25-15 MG/5ML syrup Take 5 mLs by mouth 4 (four) times daily as needed for cough. 07/31/22   Bing Neighbors, NP  venlafaxine XR (EFFEXOR-XR) 75 MG 24 hr capsule Take 150 mg by mouth daily with breakfast.     [provider]  venlafaxine XR (EFFEXOR-XR) 75 MG 24 hr capsule Take 3 capsules (225 mg total) by mouth daily with breakfast. 09/19/21   Clapacs, [name], MD  venlafaxine XR (EFFEXOR-XR) 75 MG 24 hr capsule Take 3 capsules (225 mg total) by mouth daily. 02/18/22     norethindrone-ethinyl estradiol (JUNEL FE,GILDESS FE,LOESTRIN FE) 1-20 MG-MCG tablet Take 1 tablet by mouth daily. 08/18/16 07/27/18  Harrington Challenger, NP  Norethindrone-Ethinyl Estradiol-Fe Biphas (LO LOESTRIN FE) 1 MG-10 MCG / 10 MCG tablet Take 1 tablet by mouth daily. 01/04/19 04/18/19  Harrington Challenger, NP  topiramate (TOPAMAX) 50 MG tablet Take 50 mg by mouth daily.  07/27/18  [provider]    Family History Family History  Problem Relation Age of Onset   Diabetes Mother    Thyroid disease Mother    Diabetes Maternal Grandmother    Hypertension Father     Social History Social History   Tobacco Use   Smoking status: Never   Smokeless tobacco: Never  Vaping Use   Vaping status: Never Used  Substance Use Topics   Alcohol use: Not Currently   Drug use: Yes    Types: Marijuana     Allergies   Elemental sulfur, Sulfa antibiotics, Latex, Sulfa antibiotics, Sulfa antibiotics, and Tomato   Review of Systems Review of Systems  Constitutional:  Positive for fever.  HENT:  Positive for congestion, sinus pressure and sinus pain.   Respiratory:  Positive for cough.   All other systems reviewed and are negative.    Physical Exam Triage Vital Signs ED Triage Vitals  Encounter Vitals Group     BP       Systolic BP Percentile      Diastolic BP Percentile      Pulse      Resp      Temp      Temp src      SpO2      Weight      Height      Head Circumference      Peak Flow      Pain Score      Pain Loc      Pain Education      Exclude from Growth Chart    No data found.  Updated Vital Signs BP 113/76 (BP Location: Right Arm)   Pulse 89  Temp 98.1 F (36.7 C) (Oral)   Ht 5\' 8"  (1.727 m)   Wt (!) 310 lb (140.6 kg)   LMP 10/24/2022   SpO2 98%   BMI 47.14 kg/m   Visual Acuity Right Eye Distance:   Left Eye Distance:   Bilateral Distance:    Right Eye Near:   Left Eye Near:    Bilateral Near:     Physical Exam Vitals and nursing note reviewed.  Constitutional:      General: She is not in acute distress.    Appearance: She is well-developed and well-groomed.  HENT:     Head: Normocephalic and atraumatic.     Right Ear: Tympanic membrane is erythematous.     Left Ear: Tympanic membrane is erythematous.     Nose: Mucosal edema and congestion present.     Right Sinus: Maxillary sinus tenderness present.     Left Sinus: Maxillary sinus tenderness present.     Mouth/Throat:     Lips: Pink.     Mouth: Mucous membranes are moist.     Pharynx: Oropharynx is clear.  Eyes:     Conjunctiva/sclera: Conjunctivae normal.  Cardiovascular:     Rate and Rhythm: Normal rate and regular rhythm.     Pulses: Normal pulses.     Heart sounds: Normal heart sounds. No murmur heard. Pulmonary:     Effort: Pulmonary effort is normal. No respiratory distress.     Breath sounds: Normal breath sounds and air entry.  Abdominal:     Palpations: Abdomen is soft.     Tenderness: There is no abdominal tenderness.  Musculoskeletal:        General: No swelling.     Cervical back: Neck supple.  Skin:    General: Skin is warm and dry.     Capillary Refill: Capillary refill takes less than 2 seconds.  Neurological:     General: No focal deficit present.     Mental Status: She is alert and  oriented to person, place, and time.     GCS: GCS eye subscore is 4. GCS verbal subscore is 5. GCS motor subscore is 6.  Psychiatric:        Attention and Perception: Attention normal.        Mood and Affect: Mood normal.        Speech: Speech normal.        Behavior: Behavior normal. Behavior is cooperative.      UC Treatments / Results  Labs (all labs ordered are listed, but only abnormal results are displayed) Labs Reviewed - No data to display  EKG   Radiology No results found.  Procedures Procedures (including critical care time)  Medications Ordered in UC Medications - No data to display  Initial Impression / Assessment and Plan / UC Course  I have reviewed the triage vital signs and the nursing notes.  Pertinent labs & imaging results that were available during my care of the patient were reviewed by me and considered in my medical decision making (see chart for details).  Clinical Course as of 10/30/22 1722  Thu Oct 30, 2022  1714 Pt requested diflucan after d/c for yeast after taking antibiotics. Rx sent. [JD]    Clinical Course User Index [JD] Jahel Wavra, Para March, NP    Ddx: Bilateral otitis media, acute URI, allergies, viral illness, secondhand smoke exposure Final Clinical Impressions(s) / UC Diagnoses   Final diagnoses:  Non-recurrent acute suppurative otitis media of both ears without spontaneous rupture of tympanic  membranes  Acute upper respiratory infection     Discharge Instructions      Take antibiotic as directed, drink plenty of water.  Avoid smoke exposure.  May alternate Tylenol ibuprofen as label directed for weight-based dosing.  Follow-up with PCP.  Return as needed     ED Prescriptions     Medication Sig Dispense Auth. Provider   amoxicillin-clavulanate (AUGMENTIN) 875-125 MG tablet Take 1 tablet by mouth every 12 (twelve) hours for 7 days. 14 tablet Orianna Biskup, NP   fluconazole (DIFLUCAN) 150 MG tablet Take 1 tablet (150  mg total) by mouth once for 1 dose. 1 tablet Nolah Krenzer, Para March, NP      PDMP not reviewed this encounter.   Clancy Gourd, NP 10/30/22 1722

## 2022-11-06 ENCOUNTER — Ambulatory Visit
Admission: RE | Admit: 2022-11-06 | Discharge: 2022-11-06 | Disposition: A | Discharge status: Home / Self Care | Payer: BLUE CROSS/BLUE SHIELD | Source: Ambulatory Visit | Attending: Emergency Medicine | Admitting: Emergency Medicine

## 2022-11-06 ENCOUNTER — Ambulatory Visit: Payer: BLUE CROSS/BLUE SHIELD

## 2022-11-06 VITALS — BP 122/77 | HR 78 | Temp 98.4°F | Resp 18 | Ht 68.0 in | Wt 310.0 lb

## 2022-11-06 DIAGNOSIS — N939 Abnormal uterine and vaginal bleeding, unspecified: Secondary | ICD-10-CM

## 2022-11-06 DIAGNOSIS — D649 Anemia, unspecified: Secondary | ICD-10-CM | POA: Diagnosis not present

## 2022-11-06 LAB — URINALYSIS, W/ REFLEX TO CULTURE (INFECTION SUSPECTED)
Bilirubin Urine: NEGATIVE
Glucose, UA: NEGATIVE mg/dL
Ketones, ur: 15 mg/dL — AB
Leukocytes,Ua: NEGATIVE
Nitrite: NEGATIVE
Protein, ur: 30 mg/dL — AB
RBC / HPF: >50 RBC/hpf (ref 0–5)
Specific Gravity, Urine: >1.03 — ABNORMAL HIGH (ref 1.005–1.030)
pH: 6 (ref 5.0–8.0)

## 2022-11-06 LAB — CBC WITH DIFFERENTIAL/PLATELET
Abs Immature Granulocytes: 0.02 10*3/uL (ref 0.00–0.07)
Basophils Absolute: 0.1 10*3/uL (ref 0.0–0.1)
Basophils Relative: 1 %
Eosinophils Absolute: 0.9 10*3/uL — ABNORMAL HIGH (ref 0.0–0.5)
Eosinophils Relative: 10 %
HCT: 35.7 % — ABNORMAL LOW (ref 36.0–46.0)
Hemoglobin: 11.6 g/dL — ABNORMAL LOW (ref 12.0–15.0)
Immature Granulocytes: 0 %
Lymphocytes Relative: 27 %
Lymphs Abs: 2.5 10*3/uL (ref 0.7–4.0)
MCH: 28.8 pg (ref 26.0–34.0)
MCHC: 32.5 g/dL (ref 30.0–36.0)
MCV: 88.6 fL (ref 80.0–100.0)
Monocytes Absolute: 0.7 10*3/uL (ref 0.1–1.0)
Monocytes Relative: 7 %
Neutro Abs: 5 10*3/uL (ref 1.7–7.7)
Neutrophils Relative %: 55 %
Platelets: 305 10*3/uL (ref 150–400)
RBC: 4.03 MIL/uL (ref 3.87–5.11)
RDW: 13.4 % (ref 11.5–15.5)
WBC: 9.1 10*3/uL (ref 4.0–10.5)
nRBC: 0 % (ref 0.0–0.2)

## 2022-11-06 LAB — PREGNANCY, URINE: Preg Test, Ur: NEGATIVE

## 2022-11-06 MED ORDER — MEGESTROL ACETATE 20 MG PO TABS: 40.0000 mg | ORAL_TABLET | Freq: Two times a day (BID) | ORAL | 0 refills | Status: AC

## 2022-11-06 MED ORDER — NAPROXEN 500 MG PO TABS: 500.0000 mg | ORAL_TABLET | Freq: Two times a day (BID) | ORAL | 0 refills | Status: AC

## 2022-11-06 NOTE — ED Provider Notes (Signed)
HPI  SUBJECTIVE:  Catherine Mitchell is a 37 y.o. female who presents with 1 month of vaginal bleeding alternating between spotting and heavy bleeding with clots, fatigue, shortness of breath with exertion, crampy, intermittent, minutes hours along lower abdominal pain that radiates to her back.  She states that she can go through up to 2-3 pads per hour.  She also reports headache, nausea, constipation for the past 4 days.  She normally stools daily.  She denies any fevers-she is currently on antibiotics for an otitis media.  No vaginal odor, discharge.  She is in a long-term monogamous relationship with a female, who is asymptomatic.  STDs are not a concern today.  She had symptoms like this before at age 50 and 57.  She has tried multivitamins and eating an iron rich diet without improvement in her symptoms.  Symptoms are worse with exertion.  She has a past medical history of endometriosis, ovarian cyst, depression and panic attacks.  No history of PE, DVT, cancer, coagulopathies, PID, uterine fibroids.  PCP: None.  GYN: None.    Past Medical History:  Diagnosis Date   Anxiety    Depression    HSV (herpes simplex virus) anogenital infection    Hypoglycemia    Kidney stone    Vaginal Pap smear, abnormal     Past Surgical History:  Procedure Laterality Date   INTRAUTERINE DEVICE INSERTION  04/20/2013   ParaGard   nexplanon removal  2015   URETHRA SURGERY  had it stretched as a child   WISDOM TOOTH EXTRACTION      Family History  Problem Relation Age of Onset   Diabetes Mother    Thyroid disease Mother    Diabetes Maternal Grandmother    Hypertension Father     Social History   Tobacco Use   Smoking status: Never   Smokeless tobacco: Never  Vaping Use   Vaping status: Never Used  Substance Use Topics   Alcohol use: Not Currently   Drug use: Yes    Types: Marijuana    No current facility-administered medications for this encounter.  Current Outpatient Medications:     megestrol (MEGACE) 20 MG tablet, Take 2 tablets (40 mg total) by mouth 2 (two) times daily for 21 days., Disp: 84 tablet, Rfl: 0   naproxen (NAPROSYN) 500 MG tablet, Take 1 tablet (500 mg total) by mouth 2 (two) times daily for 5 days., Disp: 10 tablet, Rfl: 0   albuterol (VENTOLIN HFA) 108 (90 Base) MCG/ACT inhaler, Inhale 2 puffs into the lungs every 6 (six) hours as needed for wheezing or shortness of breath., Disp: 8 g, Rfl: 0   amoxicillin-clavulanate (AUGMENTIN) 875-125 MG tablet, Take 1 tablet by mouth every 12 (twelve) hours for 7 days., Disp: 14 tablet, Rfl: 0   carbamazepine (TEGRETOL) 200 MG tablet, Take 1 tablet (200 mg total) by mouth 2 (two) times daily. (Patient not taking: Reported on 07/31/2022), Disp: 60 tablet, Rfl: 1   cetirizine (ZYRTEC) 10 MG tablet, Take 1 tablet (10 mg total) by mouth daily., Disp: 30 tablet, Rfl: 0   clonazePAM (KLONOPIN) 0.5 MG tablet, Take 1 tablet (0.5 mg total) by mouth 3 (three) times daily., Disp: 90 tablet, Rfl: 1   clonazePAM (KLONOPIN) 1 MG tablet, Take 1 mg by mouth 3 (three) times daily., Disp: , Rfl:    doxazosin (CARDURA) 4 MG tablet, Take 1 tablet (4 mg total) by mouth daily. (Patient not taking: Reported on 07/31/2022), Disp: 10 tablet, Rfl: 0  gabapentin (NEURONTIN) 300 MG capsule, Take 2 capsules (600 mg total) by mouth 3 (three) times daily., Disp: 180 capsule, Rfl: 11   hydrOXYzine (ATARAX) 50 MG tablet, Take 1 tablet (50 mg total) by mouth 3 (three) times daily as needed., Disp: 90 tablet, Rfl: 11   lidocaine (LIDODERM) 5 %, Place 1 patch onto the skin every 12 (twelve) hours. Remove & Discard patch within 12 hours or as directed by MD (Patient not taking: Reported on 07/31/2022), Disp: 10 patch, Rfl: 0   ondansetron (ZOFRAN-ODT) 4 MG disintegrating tablet, Take 1 tablet (4 mg total) by mouth every 8 (eight) hours as needed for nausea or vomiting., Disp: 20 tablet, Rfl: 0   promethazine-dextromethorphan (PROMETHAZINE-DM) 6.25-15 MG/5ML syrup,  Take 5 mLs by mouth 4 (four) times daily as needed for cough., Disp: 240 mL, Rfl: 0   venlafaxine XR (EFFEXOR-XR) 75 MG 24 hr capsule, Take 150 mg by mouth daily with breakfast. , Disp: , Rfl:    venlafaxine XR (EFFEXOR-XR) 75 MG 24 hr capsule, Take 3 capsules (225 mg total) by mouth daily with breakfast., Disp: 90 capsule, Rfl: 1   venlafaxine XR (EFFEXOR-XR) 75 MG 24 hr capsule, Take 3 capsules (225 mg total) by mouth daily., Disp: 90 capsule, Rfl: 9  Allergies  Allergen Reactions   Elemental Sulfur Other (See Comments)    Throat swells,blisters   Sulfa Antibiotics Anaphylaxis   Latex     BREAKS OUT HANDS   Sulfa Antibiotics Hives   Sulfa Antibiotics    Tomato     GI UPSET     ROS  As noted in HPI.   Physical Exam  BP 122/77 (BP Location: Right Arm)   Pulse 78   Temp 98.4 F (36.9 C) (Oral)   Resp 18   Ht 5\' 8"  (1.727 m)   Wt (!) 140.6 kg   LMP 10/24/2022   SpO2 93%   BMI 47.14 kg/m   Constitutional: Well developed, well nourished, no acute distress Eyes:  EOMI, conjunctiva normal bilaterally HENT: Normocephalic, atraumatic,mucus membranes moist Respiratory: Normal inspiratory effort Cardiovascular: Normal rate, regular rhythm. GI: nondistended, soft, positive lower suprapubic tenderness.  No right lower quadrant, left lower quadrant tenderness.  No other abdominal tenderness.  Active bowel sounds.  No rebound, guarding. Back: No CVAT GU: Deferred skin: No rash, skin intact Musculoskeletal: no deformities Neurologic: Alert & oriented x 3, no focal neuro deficits Psychiatric: Speech and behavior appropriate   ED Course   Medications - No data to display  Orders Placed This Encounter  Procedures   Urinalysis, w/ Reflex to Culture (Infection Suspected) -Urine, Clean Catch    Standing Status:   Standing    Number of Occurrences:   1    Order Specific Question:   Specimen Source    Answer:   Urine, Clean Catch [76]   Pregnancy, urine    Standing Status:    Standing    Number of Occurrences:   1   CBC with Differential    Standing Status:   Standing    Number of Occurrences:   1   Ambulatory referral to Gynecology    Referral Priority:   Urgent    Referral Type:   Consultation    Referral Reason:   Specialty Services Required    Requested Specialty:   Gynecology    Number of Visits Requested:   1    Results for orders placed or performed during the hospital encounter of 11/06/22 (from the past 24 hour(s))  Urinalysis, w/ Reflex to Culture (Infection Suspected) -Urine, Clean Catch     Status: Abnormal   Collection Time: 11/06/22  7:16 PM  Result Value Ref Range   Specimen Source URINE, CLEAN CATCH    Color, Urine YELLOW YELLOW   APPearance HAZY (A) CLEAR   Specific Gravity, Urine >1.030 (H) 1.005 - 1.030   pH 6.0 5.0 - 8.0   Glucose, UA NEGATIVE NEGATIVE mg/dL   Hgb urine dipstick LARGE (A) NEGATIVE   Bilirubin Urine NEGATIVE NEGATIVE   Ketones, ur 15 (A) NEGATIVE mg/dL   Protein, ur 30 (A) NEGATIVE mg/dL   Nitrite NEGATIVE NEGATIVE   Leukocytes,Ua NEGATIVE NEGATIVE   Squamous Epithelial / HPF 6-10 0 - 5 /HPF   WBC, UA 0-5 0 - 5 WBC/hpf   RBC / HPF >50 0 - 5 RBC/hpf   Bacteria, UA FEW (A) NONE SEEN   Mucus PRESENT   Pregnancy, urine     Status: None   Collection Time: 11/06/22  7:16 PM  Result Value Ref Range   Preg Test, Ur NEGATIVE NEGATIVE  CBC with Differential     Status: Abnormal   Collection Time: 11/06/22  7:50 PM  Result Value Ref Range   WBC 9.1 4.0 - 10.5 K/uL   RBC 4.03 3.87 - 5.11 MIL/uL   Hemoglobin 11.6 (L) 12.0 - 15.0 g/dL   HCT 86.5 (L) 78.4 - 69.6 %   MCV 88.6 80.0 - 100.0 fL   MCH 28.8 26.0 - 34.0 pg   MCHC 32.5 30.0 - 36.0 g/dL   RDW 29.5 28.4 - 13.2 %   Platelets 305 150 - 400 K/uL   nRBC 0.0 0.0 - 0.2 %   Neutrophils Relative % 55 %   Neutro Abs 5.0 1.7 - 7.7 K/uL   Lymphocytes Relative 27 %   Lymphs Abs 2.5 0.7 - 4.0 K/uL   Monocytes Relative 7 %   Monocytes Absolute 0.7 0.1 - 1.0 K/uL    Eosinophils Relative 10 %   Eosinophils Absolute 0.9 (H) 0.0 - 0.5 K/uL   Basophils Relative 1 %   Basophils Absolute 0.1 0.0 - 0.1 K/uL   Immature Granulocytes 0 %   Abs Immature Granulocytes 0.02 0.00 - 0.07 K/uL   No results found.  ED Clinical Impression  1. Abnormal uterine bleeding (AUB)   2. Symptomatic anemia      ED Assessment/Plan     Hemoglobin 11.6, from 12.2 6 months ago.  She is not pregnant.  UA negative for UTI.  I suspect that her symptoms are coming from acute anemia from the menorrhagia.  Will send home with Naprosyn, Megace for 3 weeks.  Continue multivitamin/iron rich diet.  Urgent GYN referral.  She has no evidence of a surgical abdomen.  Advised increase fluids, MiraLAX, Dulcolax for her constipation.  Discussed labs,  MDM, treatment plan, and plan for follow-up with parent and patient. Discussed sn/sx that should prompt return to the ED. They agree  with plan.   Meds ordered this encounter  Medications   naproxen (NAPROSYN) 500 MG tablet    Sig: Take 1 tablet (500 mg total) by mouth 2 (two) times daily for 5 days.    Dispense:  10 tablet    Refill:  0   megestrol (MEGACE) 20 MG tablet    Sig: Take 2 tablets (40 mg total) by mouth 2 (two) times daily for 21 days.    Dispense:  84 tablet    Refill:  0      *  This clinic note was created using Scientist, clinical (histocompatibility and immunogenetics). Therefore, there may be occasional mistakes despite careful proofreading.  ?    Domenick Gong, MD 11/07/22 1344

## 2022-11-06 NOTE — Discharge Instructions (Signed)
Your urine pregnancy is negative.  Your hemoglobin today is 11.6, which is down from 12.26 months ago.  I suspect most your symptoms are coming from anemia.  We are going to stop the vaginal bleeding with Naprosyn 500 mg twice a day, and the Megace.  Continue multivitamins and an iron rich diet.  I have put in an urgent referral to gynecology.  Go to the ER for the signs and symptoms we discussed

## 2022-11-06 NOTE — ED Triage Notes (Signed)
Patient states menstrual cycle on for about 4 weeks, feels abdomen is bigger than normal and has abdominal pain and lower back pain.

## 2022-11-18 NOTE — Progress Notes (Unsigned)
    GYNECOLOGY PROGRESS NOTE  Subjective:    Patient ID: Catherine Mitchell, female    DOB: May 17, 1985, 37 y.o.   MRN: [id number]  HPI  Patient is a 37 y.o. No obstetric history on file. female who presents for abnormal uterine bleeding  {Common ambulatory SmartLinks:19316}  Review of Systems {ros; complete:30496}   Objective:   Last menstrual period 10/24/2022. There is no height or weight on file to calculate BMI. General appearance: {general exam:16600} Abdomen: {abdominal exam:16834} Pelvic: {pelvic exam:16852::"cervix normal in appearance","external genitalia normal","no adnexal masses or tenderness","no cervical motion tenderness","rectovaginal septum normal","uterus normal size, shape, and consistency","vagina normal without discharge"} Extremities: {extremity exam:5109} Neurologic: {neuro exam:17854}   Assessment:   No diagnosis found.   Plan:   There are no diagnoses linked to this encounter.

## 2022-11-19 ENCOUNTER — Other Ambulatory Visit (HOSPITAL_COMMUNITY)
Admission: RE | Admit: 2022-11-19 | Discharge: 2022-11-19 | Disposition: A | Discharge status: Home / Self Care | Payer: BLUE CROSS/BLUE SHIELD | Source: Ambulatory Visit | Attending: Obstetrics | Admitting: Obstetrics

## 2022-11-19 ENCOUNTER — Ambulatory Visit (INDEPENDENT_AMBULATORY_CARE_PROVIDER_SITE_OTHER): Payer: BLUE CROSS/BLUE SHIELD | Admitting: Obstetrics

## 2022-11-19 ENCOUNTER — Encounter: Payer: Self-pay | Admitting: Obstetrics

## 2022-11-19 VITALS — BP 138/90 | Ht 68.0 in | Wt 309.0 lb

## 2022-11-19 DIAGNOSIS — N939 Abnormal uterine and vaginal bleeding, unspecified: Secondary | ICD-10-CM

## 2022-11-19 DIAGNOSIS — Z3202 Encounter for pregnancy test, result negative: Secondary | ICD-10-CM

## 2022-11-19 DIAGNOSIS — N841 Polyp of cervix uteri: Secondary | ICD-10-CM

## 2022-11-19 DIAGNOSIS — N96 Recurrent pregnancy loss: Secondary | ICD-10-CM | POA: Diagnosis not present

## 2022-11-19 DIAGNOSIS — Z124 Encounter for screening for malignant neoplasm of cervix: Secondary | ICD-10-CM

## 2022-11-19 DIAGNOSIS — Z6841 Body Mass Index (BMI) 40.0 and over, adult: Secondary | ICD-10-CM

## 2022-11-19 LAB — POCT URINE PREGNANCY: Preg Test, Ur: NEGATIVE

## 2022-11-20 LAB — SURGICAL PATHOLOGY

## 2022-11-21 LAB — CYTOLOGY - PAP
Chlamydia: NEGATIVE
Comment: NEGATIVE
Comment: NEGATIVE
Comment: NORMAL
Diagnosis: UNDETERMINED — AB
High risk HPV: NEGATIVE
Neisseria Gonorrhea: NEGATIVE

## 2022-11-28 LAB — ANTIPHOSPHOLIPID SYNDROME COMP
APTT: 28.9 s
Anticardiolipin Ab, IgA: <10 [APL'U]
Anticardiolipin Ab, IgG: <10 [GPL'U]
Anticardiolipin Ab, IgM: <10 [MPL'U]
Antiphosphatidylserine IgG: 0 {GPS'U}
Antiphosphatidylserine IgM: 0 {MPS'U}
Antiprothrombin Antibody, IgG: 6 G units
Beta-2 Glycoprotein I, IgA: <10 SAU
Beta-2 Glycoprotein I, IgG: <10 SGU
Beta-2 Glycoprotein I, IgM: <10 SMU
DRVVT Screen Seconds: 46.7 s
Hexagonal Phospholipid Neutral: 9 s
Platelet Neutralization: 5.8 s — ABNORMAL HIGH

## 2022-11-28 LAB — PROGESTERONE: Progesterone: 0.3 ng/mL

## 2022-11-28 LAB — FACTOR 5 LEIDEN

## 2022-11-28 LAB — PROTEIN C ACTIVITY: Protein C Activity: 143 % (ref 73–180)

## 2022-11-28 LAB — PROTEIN S ACTIVITY: Protein S Activity: 115 % (ref 63–140)

## 2022-11-28 LAB — TSH+FREE T4
Free T4: 1.39 ng/dL (ref 0.82–1.77)
TSH: 0.419 u[IU]/mL — ABNORMAL LOW (ref 0.450–4.500)

## 2022-11-28 LAB — PROLACTIN: Prolactin: 6.5 ng/mL (ref 4.8–33.4)

## 2022-12-02 ENCOUNTER — Ambulatory Visit
Admission: RE | Admit: 2022-12-02 | Discharge: 2022-12-02 | Disposition: A | Discharge status: Home / Self Care | Payer: BLUE CROSS/BLUE SHIELD | Source: Ambulatory Visit | Attending: Obstetrics | Admitting: Obstetrics

## 2022-12-02 DIAGNOSIS — N939 Abnormal uterine and vaginal bleeding, unspecified: Secondary | ICD-10-CM | POA: Insufficient documentation
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
## 2022-12-03 ENCOUNTER — Ambulatory Visit: Payer: BLUE CROSS/BLUE SHIELD | Admitting: Obstetrics

## 2022-12-08 ENCOUNTER — Emergency Department
Admission: EM | Admit: 2022-12-08 | Discharge: 2022-12-08 | Disposition: A | Discharge status: Home / Self Care | Payer: BLUE CROSS/BLUE SHIELD | Attending: Emergency Medicine | Admitting: Emergency Medicine

## 2022-12-08 ENCOUNTER — Other Ambulatory Visit: Payer: Self-pay

## 2022-12-08 DIAGNOSIS — T22252A Burn of second degree of left shoulder, initial encounter: Secondary | ICD-10-CM | POA: Insufficient documentation

## 2022-12-08 DIAGNOSIS — X118XXA Contact with other hot tap-water, initial encounter: Secondary | ICD-10-CM | POA: Insufficient documentation

## 2022-12-08 DIAGNOSIS — T300 Burn of unspecified body region, unspecified degree: Secondary | ICD-10-CM

## 2022-12-08 MED ORDER — HYDROCODONE-ACETAMINOPHEN 5-325 MG PO TABS
1.0000 | ORAL_TABLET | ORAL | 0 refills | Status: AC | PRN
Start: 2022-12-08 — End: ?

## 2022-12-08 MED ORDER — BACITRACIN ZINC 500 UNIT/GM EX OINT: TOPICAL_OINTMENT | CUTANEOUS | 0 refills | Status: AC
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
## 2022-12-08 NOTE — Discharge Instructions (Addendum)
Please call the number provided for the wound management center for a follow-up appointment.  Please take your pain medicine as needed but only as prescribed.  Do not take this pain medication within 4 hours of using your prescribed clonazepam.  Do not drink alcohol or drive while taking this medication.
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
## 2022-12-10 ENCOUNTER — Ambulatory Visit: Payer: BLUE CROSS/BLUE SHIELD | Admitting: Obstetrics

## 2022-12-10 NOTE — Progress Notes (Deleted)
    GYNECOLOGY PROGRESS NOTE  Subjective:  PCP: Pcp, No  Patient ID: Catherine Mitchell, female    DOB: Jan 10, 1986, 37 y.o.   MRN: [id number]  HPI  Patient is a 37 y.o. G68P0030 female who is here to discuss results from Endometrial biopsy and cervical polyp, labs and pap from, previous visit. Pt was experiencing AUB  {Common ambulatory SmartLinks:19316}  Review of Systems {ros; complete:30496}   Objective:   There were no vitals taken for this visit. There is no height or weight on file to calculate BMI.  General appearance: {general exam:16600} Abdomen: {abdominal exam:16834} Pelvic: {pelvic exam:16852::"cervix normal in appearance","external genitalia normal","no adnexal masses or tenderness","no cervical motion tenderness","rectovaginal septum normal","uterus normal size, shape, and consistency","vagina normal without discharge"} Extremities: {extremity exam:5109} Neurologic: {neuro exam:17854}   Assessment/Plan:   No diagnosis found.   There are no diagnoses linked to this encounter.     Julieanne Manson, DO [facility]

## 2022-12-18 ENCOUNTER — Ambulatory Visit: Payer: BLUE CROSS/BLUE SHIELD | Admitting: Obstetrics

## 2022-12-18 NOTE — Progress Notes (Deleted)
     GYNECOLOGY PROGRESS NOTE  Subjective:  PCP: Pcp, No  Patient ID: Catherine Mitchell, female    DOB: [birth year], 37 y.o.   MRN: [id number]  HPI  Patient is a 37 y.o. G42P0030 female who presents today for follow up on lab and Ultrasound results.   {Common ambulatory SmartLinks:19316}  Review of Systems {ros; complete:30496}   Objective:   There were no vitals taken for this visit. There is no height or weight on file to calculate BMI.  General appearance: {general exam:16600} Abdomen: {abdominal exam:16834} Pelvic: {pelvic exam:16852::"cervix normal in appearance","external genitalia normal","no adnexal masses or tenderness","no cervical motion tenderness","rectovaginal septum normal","uterus normal size, shape, and consistency","vagina normal without discharge"} Extremities: {extremity exam:5109} Neurologic: {neuro exam:17854}   Assessment/Plan:   No diagnosis found.   There are no diagnoses linked to this encounter.     Julieanne Manson, DO [facility]

## 2023-01-02 ENCOUNTER — Telehealth: Payer: BLUE CROSS/BLUE SHIELD | Admitting: Family Medicine

## 2023-01-02 DIAGNOSIS — J069 Acute upper respiratory infection, unspecified: Secondary | ICD-10-CM | POA: Diagnosis not present

## 2023-01-02 DIAGNOSIS — R059 Cough, unspecified: Secondary | ICD-10-CM | POA: Diagnosis not present

## 2023-01-02 MED ORDER — BENZONATATE 100 MG PO CAPS
100.0000 mg | ORAL_CAPSULE | Freq: Three times a day (TID) | ORAL | 0 refills | Status: AC | PRN
Start: 2023-01-02 — End: 2023-01-09

## 2023-01-02 MED ORDER — ALBUTEROL SULFATE HFA 108 (90 BASE) MCG/ACT IN AERS: 2.0000 | INHALATION_SPRAY | Freq: Four times a day (QID) | RESPIRATORY_TRACT | 0 refills | Status: DC | PRN

## 2023-01-02 MED ORDER — PREDNISONE 10 MG (21) PO TBPK
ORAL_TABLET | ORAL | 0 refills | Status: DC
Start: 2023-01-02 — End: 2023-08-17

## 2023-01-02 MED ORDER — FLUTICASONE PROPIONATE 50 MCG/ACT NA SUSP
2.0000 | Freq: Every day | NASAL | 0 refills | Status: AC
Start: 2023-01-02 — End: ?

## 2023-01-02 NOTE — Patient Instructions (Signed)
[name], thank you for joining Reed Pandy, PA-C for today's virtual visit.  While this provider is not your primary care provider (PCP), if your PCP is located in our provider database this encounter information will be shared with them immediately following your visit.   A [facility] MyChart account gives you access to today's visit and all your visits, tests, and labs performed at [facility] " click here if you don't have a [facility] MyChart account or go to [email or web address]  Consent: (Patient) [name] provided verbal consent for this virtual visit at the beginning of the encounter.  Current Medications:  Current Outpatient Medications:    benzonatate (TESSALON) 100 MG capsule, Take 1 capsule (100 mg total) by mouth 3 (three) times daily as needed for up to 7 days., Disp: 21 capsule, Rfl: 0   fluticasone (FLONASE) 50 MCG/ACT nasal spray, Place 2 sprays into both nostrils daily., Disp: 16 g, Rfl: 0   predniSONE (STERAPRED UNI-PAK 21 TAB) 10 MG (21) TBPK tablet, Take following package directions., Disp: 21 tablet, Rfl: 0   albuterol (VENTOLIN HFA) 108 (90 Base) MCG/ACT inhaler, Inhale 2 puffs into the lungs every 6 (six) hours as needed for wheezing or shortness of breath., Disp: 8 g, Rfl: 0   bacitracin ointment, Apply to affected area twice daily, Disp: 30 g, Rfl: 0   carbamazepine (TEGRETOL) 200 MG tablet, Take 1 tablet (200 mg total) by mouth 2 (two) times daily., Disp: 60 tablet, Rfl: 1   cetirizine (ZYRTEC) 10 MG tablet, Take 1 tablet (10 mg total) by mouth daily., Disp: 30 tablet, Rfl: 0   clonazePAM (KLONOPIN) 0.5 MG tablet, Take 1 tablet (0.5 mg total) by mouth 3 (three) times daily., Disp: 90 tablet, Rfl: 1   clonazePAM (KLONOPIN) 1 MG tablet, Take 1 mg by mouth 3 (three) times daily., Disp: , Rfl:    doxazosin (CARDURA) 4 MG tablet, Take 1 tablet (4 mg total) by mouth daily., Disp: 10 tablet, Rfl: 0   gabapentin (NEURONTIN) 300 MG  capsule, Take 2 capsules (600 mg total) by mouth 3 (three) times daily., Disp: 180 capsule, Rfl: 11   HYDROcodone-acetaminophen (NORCO/VICODIN) 5-325 MG tablet, Take 1 tablet by mouth every 4 (four) hours as needed., Disp: 10 tablet, Rfl: 0   hydrOXYzine (ATARAX) 50 MG tablet, Take 1 tablet (50 mg total) by mouth 3 (three) times daily as needed., Disp: 90 tablet, Rfl: 11   lidocaine (LIDODERM) 5 %, Place 1 patch onto the skin every 12 (twelve) hours. Remove & Discard patch within 12 hours or as directed by MD, Disp: 10 patch, Rfl: 0   ondansetron (ZOFRAN-ODT) 4 MG disintegrating tablet, Take 1 tablet (4 mg total) by mouth every 8 (eight) hours as needed for nausea or vomiting., Disp: 20 tablet, Rfl: 0   promethazine-dextromethorphan (PROMETHAZINE-DM) 6.25-15 MG/5ML syrup, Take 5 mLs by mouth 4 (four) times daily as needed for cough., Disp: 240 mL, Rfl: 0   venlafaxine XR (EFFEXOR-XR) 75 MG 24 hr capsule, Take 150 mg by mouth daily with breakfast. , Disp: , Rfl:    venlafaxine XR (EFFEXOR-XR) 75 MG 24 hr capsule, Take 3 capsules (225 mg total) by mouth daily with breakfast., Disp: 90 capsule, Rfl: 1   venlafaxine XR (EFFEXOR-XR) 75 MG 24 hr capsule, Take 3 capsules (225 mg total) by mouth daily., Disp: 90 capsule, Rfl: 9   Medications ordered in this encounter:  Meds ordered this encounter  Medications   albuterol (VENTOLIN HFA) 108 (90  Base) MCG/ACT inhaler    Sig: Inhale 2 puffs into the lungs every 6 (six) hours as needed for wheezing or shortness of breath.    Dispense:  8 g    Refill:  0   predniSONE (STERAPRED UNI-PAK 21 TAB) 10 MG (21) TBPK tablet    Sig: Take following package directions.    Dispense:  21 tablet    Refill:  0    Please dispense one standard blister pack taper.   fluticasone (FLONASE) 50 MCG/ACT nasal spray    Sig: Place 2 sprays into both nostrils daily.    Dispense:  16 g    Refill:  0   benzonatate (TESSALON) 100 MG capsule    Sig: Take 1 capsule (100 mg total) by  mouth 3 (three) times daily as needed for up to 7 days.    Dispense:  21 capsule    Refill:  0     *If you need refills on other medications prior to your next appointment, please contact your pharmacy*  Follow-Up: Call back or seek an in-person evaluation if the symptoms worsen or if the condition fails to improve as anticipated.  [facility] [phone]  Other Instructions Upper Respiratory Infection, Adult An upper respiratory infection (URI) is a common viral infection of the nose, throat, and upper air passages that lead to the lungs. The most common type of URI is the common cold. URIs usually get better on their own, without medical treatment. What are the causes? A URI is caused by a virus. You may catch a virus by: Breathing in droplets from an infected person's cough or sneeze. Touching something that has been exposed to the virus (is contaminated) and then touching your mouth, nose, or eyes. What increases the risk? You are more likely to get a URI if: You are very young or very old. You have close contact with others, such as at work, school, or a health care facility. You smoke. You have long-term (chronic) heart or lung disease. You have a weakened disease-fighting system (immune system). You have nasal allergies or asthma. You are experiencing a lot of stress. You have poor nutrition. What are the signs or symptoms? A URI usually involves some of the following symptoms: Runny or stuffy (congested) nose. Cough. Sneezing. Sore throat. Headache. Fatigue. Fever. Loss of appetite. Pain in your forehead, behind your eyes, and over your cheekbones (sinus pain). Muscle aches. Redness or irritation of the eyes. Pressure in the ears or face. How is this diagnosed? This condition may be diagnosed based on your medical history and symptoms, and a physical exam. Your health care provider may use a swab to take a mucus sample from your nose (nasal swab).  This sample can be tested to determine what virus is causing the illness. How is this treated? URIs usually get better on their own within 7-10 days. Medicines cannot cure URIs, but your health care provider may recommend certain medicines to help relieve symptoms, such as: Over-the-counter cold medicines. Cough suppressants. Coughing is a type of defense against infection that helps to clear the respiratory system, so take these medicines only as recommended by your health care provider. Fever-reducing medicines. Follow these instructions at home: Activity Rest as needed. If you have a fever, stay home from work or school until your fever is gone or until your health care provider says your URI cannot spread to other people (is no longer contagious). Your health care provider may have you wear  a face mask to prevent your infection from spreading. Relieving symptoms Gargle with a mixture of salt and water 3-4 times a day or as needed. To make salt water, completely dissolve -1 tsp (3-6 g) of salt in 1 cup (237 mL) of warm water. Use a cool-mist humidifier to add moisture to the air. This can help you breathe more easily. Eating and drinking  Drink enough fluid to keep your urine pale yellow. Eat soups and other clear broths. General instructions  Take over-the-counter and prescription medicines only as told by your health care provider. These include cold medicines, fever reducers, and cough suppressants. Do not use any products that contain nicotine or tobacco. These products include cigarettes, chewing tobacco, and vaping devices, such as e-cigarettes. If you need help quitting, ask your health care provider. Stay away from secondhand smoke. Stay up to date on all immunizations, including the yearly (annual) flu vaccine. Keep all follow-up visits. This is important. How to prevent the spread of infection to others URIs can be contagious. To prevent the infection from spreading: Wash your  hands with soap and water for at least 20 seconds. If soap and water are not available, use hand sanitizer. Avoid touching your mouth, face, eyes, or nose. Cough or sneeze into a tissue or your sleeve or elbow instead of into your hand or into the air.  Contact a health care provider if: You are getting worse instead of better. You have a fever or chills. Your mucus is brown or red. You have yellow or brown discharge coming from your nose. You have pain in your face, especially when you bend forward. You have swollen neck glands. You have pain while swallowing. You have white areas in the back of your throat. Get help right away if: You have shortness of breath that gets worse. You have severe or persistent: Headache. Ear pain. Sinus pain. Chest pain. You have chronic lung disease along with any of the following: Making high-pitched whistling sounds when you breathe, most often when you breathe out (wheezing). Prolonged cough (more than 14 days). Coughing up blood. A change in your usual mucus. You have a stiff neck. You have changes in your: Vision. Hearing. Thinking. Mood. These symptoms may be an emergency. Get help right away. Call 911. Do not wait to see if the symptoms will go away. Do not drive yourself to the hospital. Summary An upper respiratory infection (URI) is a common infection of the nose, throat, and upper air passages that lead to the lungs. A URI is caused by a virus. URIs usually get better on their own within 7-10 days. Medicines cannot cure URIs, but your health care provider may recommend certain medicines to help relieve symptoms. This information is not intended to replace advice given to you by your health care provider. Make sure you discuss any questions you have with your health care provider. Document Revised: 08/22/2020 Document Reviewed: 08/22/2020 Elsevier Patient Education  2024 [employer]    If you have been instructed to have an  in-person evaluation today at a local Urgent Care facility, please use the link below. It will take you to a list of all of our available [facility], including address, phone number and hours of operation. Please do not delay care.  [facility]  If you or a family member do not have a primary care provider, use the link below to schedule a visit and establish care. When you choose a [facility]  primary care physician or advanced practice provider, you gain a long-term partner in health. Find a Primary Care Provider  Learn more about [facility]'s in-office and virtual care options: [facility] - Get Care Now

## 2023-01-02 NOTE — Progress Notes (Signed)
Virtual Visit Consent   Catherine Mitchell, you are scheduled for a virtual visit with a [facility] provider today. Just as with appointments in the office, your consent must be obtained to participate. Your consent will be active for this visit and any virtual visit you may have with one of our providers in the next 365 days. If you have a MyChart account, a copy of this consent can be sent to you electronically.  As this is a virtual visit, video technology does not allow for your provider to perform a traditional examination. This may limit your provider's ability to fully assess your condition. If your provider identifies any concerns that need to be evaluated in person or the need to arrange testing (such as labs, EKG, etc.), we will make arrangements to do so. Although advances in technology are sophisticated, we cannot ensure that it will always work on either your end or our end. If the connection with a video visit is poor, the visit may have to be switched to a telephone visit. With either a video or telephone visit, we are not always able to ensure that we have a secure connection.  By engaging in this virtual visit, you consent to the provision of healthcare and authorize for your insurance to be billed (if applicable) for the services provided during this visit. Depending on your insurance coverage, you may receive a charge related to this service.  I need to obtain your verbal consent now. Are you willing to proceed with your visit today? Catherine Mitchell has provided verbal consent on 01/02/2023 for a virtual visit (video or telephone). Reed Pandy, [place]  Date: 01/02/2023 3:04 PM  Virtual Visit via Video Note   I, Reed Pandy, connected with  Catherine Mitchell  ([id number], March 05, 1985) on 01/02/23 at  3:00 PM EST by a video-enabled telemedicine application and verified that I am speaking with the correct person using two identifiers.  Location: Patient: Virtual Visit Location  Patient: Home Provider: Virtual Visit Location Provider: Home Office   I discussed the limitations of evaluation and management by telemedicine and the availability of in person appointments. The patient expressed understanding and agreed to proceed.    History of Present Illness: Catherine Mitchell is a 37 y.o. who identifies as a female who was assigned female at birth, and is being seen today for c/o waking up yesterday morning she woke up with a sinus headache and has fluid in her ears and feels like her equilibrium is off.  Pt states she is also coughing up and blowing out thick yellow mucus. Pt states symptoms started about two days ago.  Pt states she has taken mucinex and benadryl and feels like her symptoms are getting worse.  Pt states the cough is worse at night that during the day. Pt states he mother was sick about a week ago and ended up having a sinus infection and bronchitis.  Pt states she is also running a fever. Pt states she uses an inhaler but has ran out. Pt states she has taken prednisone in the past and it was helpful.   HPI: HPI  Problems:  Patient Active Problem List   Diagnosis Date Noted   Abnormal uterine bleeding 11/19/2022   Recurrent pregnancy loss 11/19/2022   BMI 45.0-49.9, adult (HCC) 11/19/2022   Non-recurrent acute suppurative otitis media of both ears without spontaneous rupture of tympanic membranes 10/30/2022   Acute upper respiratory infection 10/30/2022   Opioid use disorder 09/12/2021  PTSD (post-traumatic stress disorder) 09/12/2021   Severe recurrent major depression without psychotic features (HCC) 09/11/2021   MDD (major depressive disorder), recurrent episode, severe (HCC) 09/10/2021   Suicidal behavior with attempted self-injury (HCC) 09/10/2021   Intentional overdose (HCC)    Kidney stone 11/16/2018   Yeast vaginitis 12/17/2016   H N P-CERVICAL 09/18/2008   Sprain of neck 08/30/2008    Allergies:  Allergies  Allergen Reactions    Elemental Sulfur Other (See Comments)    Throat swells,blisters   Sulfa Antibiotics Anaphylaxis   Sulfacetamide Sodium-Sulfur Hives   Latex     BREAKS OUT HANDS   Sulfa Antibiotics Hives   Sulfa Antibiotics    Tomato     GI UPSET   Medications:  Current Outpatient Medications:    benzonatate (TESSALON) 100 MG capsule, Take 1 capsule (100 mg total) by mouth 3 (three) times daily as needed for up to 7 days., Disp: 21 capsule, Rfl: 0   fluticasone (FLONASE) 50 MCG/ACT nasal spray, Place 2 sprays into both nostrils daily., Disp: 16 g, Rfl: 0   predniSONE (STERAPRED UNI-PAK 21 TAB) 10 MG (21) TBPK tablet, Take following package directions., Disp: 21 tablet, Rfl: 0   albuterol (VENTOLIN HFA) 108 (90 Base) MCG/ACT inhaler, Inhale 2 puffs into the lungs every 6 (six) hours as needed for wheezing or shortness of breath., Disp: 8 g, Rfl: 0   bacitracin ointment, Apply to affected area twice daily, Disp: 30 g, Rfl: 0   carbamazepine (TEGRETOL) 200 MG tablet, Take 1 tablet (200 mg total) by mouth 2 (two) times daily., Disp: 60 tablet, Rfl: 1   cetirizine (ZYRTEC) 10 MG tablet, Take 1 tablet (10 mg total) by mouth daily., Disp: 30 tablet, Rfl: 0   clonazePAM (KLONOPIN) 0.5 MG tablet, Take 1 tablet (0.5 mg total) by mouth 3 (three) times daily., Disp: 90 tablet, Rfl: 1   clonazePAM (KLONOPIN) 1 MG tablet, Take 1 mg by mouth 3 (three) times daily., Disp: , Rfl:    doxazosin (CARDURA) 4 MG tablet, Take 1 tablet (4 mg total) by mouth daily., Disp: 10 tablet, Rfl: 0   gabapentin (NEURONTIN) 300 MG capsule, Take 2 capsules (600 mg total) by mouth 3 (three) times daily., Disp: 180 capsule, Rfl: 11   HYDROcodone-acetaminophen (NORCO/VICODIN) 5-325 MG tablet, Take 1 tablet by mouth every 4 (four) hours as needed., Disp: 10 tablet, Rfl: 0   hydrOXYzine (ATARAX) 50 MG tablet, Take 1 tablet (50 mg total) by mouth 3 (three) times daily as needed., Disp: 90 tablet, Rfl: 11   lidocaine (LIDODERM) 5 %, Place 1 patch  onto the skin every 12 (twelve) hours. Remove & Discard patch within 12 hours or as directed by MD, Disp: 10 patch, Rfl: 0   ondansetron (ZOFRAN-ODT) 4 MG disintegrating tablet, Take 1 tablet (4 mg total) by mouth every 8 (eight) hours as needed for nausea or vomiting., Disp: 20 tablet, Rfl: 0   promethazine-dextromethorphan (PROMETHAZINE-DM) 6.25-15 MG/5ML syrup, Take 5 mLs by mouth 4 (four) times daily as needed for cough., Disp: 240 mL, Rfl: 0   venlafaxine XR (EFFEXOR-XR) 75 MG 24 hr capsule, Take 150 mg by mouth daily with breakfast. , Disp: , Rfl:    venlafaxine XR (EFFEXOR-XR) 75 MG 24 hr capsule, Take 3 capsules (225 mg total) by mouth daily with breakfast., Disp: 90 capsule, Rfl: 1   venlafaxine XR (EFFEXOR-XR) 75 MG 24 hr capsule, Take 3 capsules (225 mg total) by mouth daily., Disp: 90 capsule, Rfl: 9  Observations/Objective: Patient is well-developed, well-nourished in no acute distress.  Resting comfortably at home.  Head is normocephalic, atraumatic.  No labored breathing.  Speech is clear and coherent with logical content.  Patient is alert and oriented at baseline.    Assessment and Plan: 1. Upper respiratory tract infection, unspecified type - albuterol (VENTOLIN HFA) 108 (90 Base) MCG/ACT inhaler; Inhale 2 puffs into the lungs every 6 (six) hours as needed for wheezing or shortness of breath.  Dispense: 8 g; Refill: 0 - fluticasone (FLONASE) 50 MCG/ACT nasal spray; Place 2 sprays into both nostrils daily.  Dispense: 16 g; Refill: 0  2. Cough in adult - predniSONE (STERAPRED UNI-PAK 21 TAB) 10 MG (21) TBPK tablet; Take following package directions.  Dispense: 21 tablet; Refill: 0 - benzonatate (TESSALON) 100 MG capsule; Take 1 capsule (100 mg total) by mouth 3 (three) times daily as needed for up to 7 days.  Dispense: 21 capsule; Refill: 0  -Pt to follow up with PCP or urgent care for worsening symptoms -Pt verbalized understanding   Follow Up Instructions: I discussed  the assessment and treatment plan with the patient. The patient was provided an opportunity to ask questions and all were answered. The patient agreed with the plan and demonstrated an understanding of the instructions.  A copy of instructions were sent to the patient via MyChart unless otherwise noted below.    The patient was advised to call back or seek an in-person evaluation if the symptoms worsen or if the condition fails to improve as anticipated.    Reed Pandy, PA-C

## 2023-01-03 ENCOUNTER — Telehealth: Payer: BLUE CROSS/BLUE SHIELD | Admitting: Family Medicine

## 2023-01-03 DIAGNOSIS — R112 Nausea with vomiting, unspecified: Secondary | ICD-10-CM | POA: Diagnosis not present

## 2023-01-03 DIAGNOSIS — H6691 Otitis media, unspecified, right ear: Secondary | ICD-10-CM

## 2023-01-03 MED ORDER — AMOXICILLIN-POT CLAVULANATE 875-125 MG PO TABS
1.0000 | ORAL_TABLET | Freq: Two times a day (BID) | ORAL | 0 refills | Status: AC
Start: 2023-01-03 — End: 2023-01-08

## 2023-01-03 MED ORDER — ONDANSETRON HCL 4 MG PO TABS: 4.0000 mg | ORAL_TABLET | Freq: Three times a day (TID) | ORAL | 0 refills | Status: AC | PRN

## 2023-01-03 NOTE — Progress Notes (Signed)
Virtual Visit Consent   SUHANI CHELF, you are scheduled for a virtual visit with a [facility] provider today. Just as with appointments in the office, your consent must be obtained to participate. Your consent will be active for this visit and any virtual visit you may have with one of our providers in the next 365 days. If you have a MyChart account, a copy of this consent can be sent to you electronically.  As this is a virtual visit, video technology does not allow for your provider to perform a traditional examination. This may limit your provider's ability to fully assess your condition. If your provider identifies any concerns that need to be evaluated in person or the need to arrange testing (such as labs, EKG, etc.), we will make arrangements to do so. Although advances in technology are sophisticated, we cannot ensure that it will always work on either your end or our end. If the connection with a video visit is poor, the visit may have to be switched to a telephone visit. With either a video or telephone visit, we are not always able to ensure that we have a secure connection.  By engaging in this virtual visit, you consent to the provision of healthcare and authorize for your insurance to be billed (if applicable) for the services provided during this visit. Depending on your insurance coverage, you may receive a charge related to this service.  I need to obtain your verbal consent now. Are you willing to proceed with your visit today? Catherine Mitchell has provided verbal consent on 01/03/2023 for a virtual visit (video or telephone). Reed Pandy, [place]  Date: 01/03/2023 2:35 PM  Virtual Visit via Video Note   I, Reed Pandy, connected with  Catherine Mitchell  ([id number], 11/14/1985) on 01/03/23 at  2:30 PM EST by a video-enabled telemedicine application and verified that I am speaking with the correct person using two identifiers.  Location: Patient: Virtual Visit Location  Patient: Home Provider: Virtual Visit Location Provider: Home Office   I discussed the limitations of evaluation and management by telemedicine and the availability of in person appointments. The patient expressed understanding and agreed to proceed.    History of Present Illness: Catherine Mitchell is a 37 y.o. who identifies as a female who was assigned female at birth, and is being seen today for c/o yesterday she began to have an inner ear infection and it is draining and popping.  Pt states she also has nausea and vomiting as well. Pt is requesting something for the nausea as well as the ear infection.   HPI: HPI  Problems:  Patient Active Problem List   Diagnosis Date Noted   Abnormal uterine bleeding 11/19/2022   Recurrent pregnancy loss 11/19/2022   BMI 45.0-49.9, adult (HCC) 11/19/2022   Non-recurrent acute suppurative otitis media of both ears without spontaneous rupture of tympanic membranes 10/30/2022   Acute upper respiratory infection 10/30/2022   Opioid use disorder 09/12/2021   PTSD (post-traumatic stress disorder) 09/12/2021   Severe recurrent major depression without psychotic features (HCC) 09/11/2021   MDD (major depressive disorder), recurrent episode, severe (HCC) 09/10/2021   Suicidal behavior with attempted self-injury (HCC) 09/10/2021   Intentional overdose (HCC)    Kidney stone 11/16/2018   Yeast vaginitis 12/17/2016   H N P-CERVICAL 09/18/2008   Sprain of neck 08/30/2008    Allergies:  Allergies  Allergen Reactions   Elemental Sulfur Other (See Comments)    Throat swells,blisters  Sulfa Antibiotics Anaphylaxis   Sulfacetamide Sodium-Sulfur Hives   Latex     BREAKS OUT HANDS   Sulfa Antibiotics Hives   Sulfa Antibiotics    Tomato     GI UPSET   Medications:  Current Outpatient Medications:    albuterol (VENTOLIN HFA) 108 (90 Base) MCG/ACT inhaler, Inhale 2 puffs into the lungs every 6 (six) hours as needed for wheezing or shortness of breath.,  Disp: 8 g, Rfl: 0   amoxicillin-clavulanate (AUGMENTIN) 875-125 MG tablet, Take 1 tablet by mouth 2 (two) times daily for 5 days., Disp: 10 tablet, Rfl: 0   bacitracin ointment, Apply to affected area twice daily, Disp: 30 g, Rfl: 0   benzonatate (TESSALON) 100 MG capsule, Take 1 capsule (100 mg total) by mouth 3 (three) times daily as needed for up to 7 days., Disp: 21 capsule, Rfl: 0   carbamazepine (TEGRETOL) 200 MG tablet, Take 1 tablet (200 mg total) by mouth 2 (two) times daily., Disp: 60 tablet, Rfl: 1   cetirizine (ZYRTEC) 10 MG tablet, Take 1 tablet (10 mg total) by mouth daily., Disp: 30 tablet, Rfl: 0   clonazePAM (KLONOPIN) 0.5 MG tablet, Take 1 tablet (0.5 mg total) by mouth 3 (three) times daily., Disp: 90 tablet, Rfl: 1   clonazePAM (KLONOPIN) 1 MG tablet, Take 1 mg by mouth 3 (three) times daily., Disp: , Rfl:    doxazosin (CARDURA) 4 MG tablet, Take 1 tablet (4 mg total) by mouth daily., Disp: 10 tablet, Rfl: 0   fluticasone (FLONASE) 50 MCG/ACT nasal spray, Place 2 sprays into both nostrils daily., Disp: 16 g, Rfl: 0   gabapentin (NEURONTIN) 300 MG capsule, Take 2 capsules (600 mg total) by mouth 3 (three) times daily., Disp: 180 capsule, Rfl: 11   HYDROcodone-acetaminophen (NORCO/VICODIN) 5-325 MG tablet, Take 1 tablet by mouth every 4 (four) hours as needed., Disp: 10 tablet, Rfl: 0   hydrOXYzine (ATARAX) 50 MG tablet, Take 1 tablet (50 mg total) by mouth 3 (three) times daily as needed., Disp: 90 tablet, Rfl: 11   lidocaine (LIDODERM) 5 %, Place 1 patch onto the skin every 12 (twelve) hours. Remove & Discard patch within 12 hours or as directed by MD, Disp: 10 patch, Rfl: 0   ondansetron (ZOFRAN) 4 MG tablet, Take 1 tablet (4 mg total) by mouth every 8 (eight) hours as needed for nausea or vomiting., Disp: 20 tablet, Rfl: 0   predniSONE (STERAPRED UNI-PAK 21 TAB) 10 MG (21) TBPK tablet, Take following package directions., Disp: 21 tablet, Rfl: 0   promethazine-dextromethorphan  (PROMETHAZINE-DM) 6.25-15 MG/5ML syrup, Take 5 mLs by mouth 4 (four) times daily as needed for cough., Disp: 240 mL, Rfl: 0   venlafaxine XR (EFFEXOR-XR) 75 MG 24 hr capsule, Take 150 mg by mouth daily with breakfast. , Disp: , Rfl:    venlafaxine XR (EFFEXOR-XR) 75 MG 24 hr capsule, Take 3 capsules (225 mg total) by mouth daily with breakfast., Disp: 90 capsule, Rfl: 1   venlafaxine XR (EFFEXOR-XR) 75 MG 24 hr capsule, Take 3 capsules (225 mg total) by mouth daily., Disp: 90 capsule, Rfl: 9  Observations/Objective: Patient is well-developed, well-nourished in no acute distress.  Resting comfortably at home.  Head is normocephalic, atraumatic.  No labored breathing.  Speech is clear and coherent with logical content.  Patient is alert and oriented at baseline.    Assessment and Plan: 1. Right otitis media, unspecified otitis media type - amoxicillin-clavulanate (AUGMENTIN) 875-125 MG tablet; Take 1 tablet by  mouth 2 (two) times daily for 5 days.  Dispense: 10 tablet; Refill: 0  2. Nausea and vomiting, unspecified vomiting type - ondansetron (ZOFRAN) 4 MG tablet; Take 1 tablet (4 mg total) by mouth every 8 (eight) hours as needed for nausea or vomiting.  Dispense: 20 tablet; Refill: 0  -Pt to follow up with urgent care or PCP for worsening symptoms.  -Pt verbalized understanding  Follow Up Instructions: I discussed the assessment and treatment plan with the patient. The patient was provided an opportunity to ask questions and all were answered. The patient agreed with the plan and demonstrated an understanding of the instructions.  A copy of instructions were sent to the patient via MyChart unless otherwise noted below.    The patient was advised to call back or seek an in-person evaluation if the symptoms worsen or if the condition fails to improve as anticipated.    Reed Pandy, PA-C

## 2023-01-03 NOTE — Patient Instructions (Signed)
Catherine Mitchell, thank you for joining Reed Pandy, PA-C for today's virtual visit.  While this provider is not your primary care provider (PCP), if your PCP is located in our provider database this encounter information will be shared with them immediately following your visit.   A [facility] MyChart account gives you access to today's visit and all your visits, tests, and labs performed at [facility] " click here if you don't have a [facility] MyChart account or go to [email or web address]  Consent: (Patient) Catherine Mitchell provided verbal consent for this virtual visit at the beginning of the encounter.  Current Medications:  Current Outpatient Medications:    albuterol (VENTOLIN HFA) 108 (90 Base) MCG/ACT inhaler, Inhale 2 puffs into the lungs every 6 (six) hours as needed for wheezing or shortness of breath., Disp: 8 g, Rfl: 0   amoxicillin-clavulanate (AUGMENTIN) 875-125 MG tablet, Take 1 tablet by mouth 2 (two) times daily for 5 days., Disp: 10 tablet, Rfl: 0   bacitracin ointment, Apply to affected area twice daily, Disp: 30 g, Rfl: 0   benzonatate (TESSALON) 100 MG capsule, Take 1 capsule (100 mg total) by mouth 3 (three) times daily as needed for up to 7 days., Disp: 21 capsule, Rfl: 0   carbamazepine (TEGRETOL) 200 MG tablet, Take 1 tablet (200 mg total) by mouth 2 (two) times daily., Disp: 60 tablet, Rfl: 1   cetirizine (ZYRTEC) 10 MG tablet, Take 1 tablet (10 mg total) by mouth daily., Disp: 30 tablet, Rfl: 0   clonazePAM (KLONOPIN) 0.5 MG tablet, Take 1 tablet (0.5 mg total) by mouth 3 (three) times daily., Disp: 90 tablet, Rfl: 1   clonazePAM (KLONOPIN) 1 MG tablet, Take 1 mg by mouth 3 (three) times daily., Disp: , Rfl:    doxazosin (CARDURA) 4 MG tablet, Take 1 tablet (4 mg total) by mouth daily., Disp: 10 tablet, Rfl: 0   fluticasone (FLONASE) 50 MCG/ACT nasal spray, Place 2 sprays into both nostrils daily., Disp: 16 g, Rfl: 0   gabapentin (NEURONTIN)  300 MG capsule, Take 2 capsules (600 mg total) by mouth 3 (three) times daily., Disp: 180 capsule, Rfl: 11   HYDROcodone-acetaminophen (NORCO/VICODIN) 5-325 MG tablet, Take 1 tablet by mouth every 4 (four) hours as needed., Disp: 10 tablet, Rfl: 0   hydrOXYzine (ATARAX) 50 MG tablet, Take 1 tablet (50 mg total) by mouth 3 (three) times daily as needed., Disp: 90 tablet, Rfl: 11   lidocaine (LIDODERM) 5 %, Place 1 patch onto the skin every 12 (twelve) hours. Remove & Discard patch within 12 hours or as directed by MD, Disp: 10 patch, Rfl: 0   ondansetron (ZOFRAN) 4 MG tablet, Take 1 tablet (4 mg total) by mouth every 8 (eight) hours as needed for nausea or vomiting., Disp: 20 tablet, Rfl: 0   predniSONE (STERAPRED UNI-PAK 21 TAB) 10 MG (21) TBPK tablet, Take following package directions., Disp: 21 tablet, Rfl: 0   promethazine-dextromethorphan (PROMETHAZINE-DM) 6.25-15 MG/5ML syrup, Take 5 mLs by mouth 4 (four) times daily as needed for cough., Disp: 240 mL, Rfl: 0   venlafaxine XR (EFFEXOR-XR) 75 MG 24 hr capsule, Take 150 mg by mouth daily with breakfast. , Disp: , Rfl:    venlafaxine XR (EFFEXOR-XR) 75 MG 24 hr capsule, Take 3 capsules (225 mg total) by mouth daily with breakfast., Disp: 90 capsule, Rfl: 1   venlafaxine XR (EFFEXOR-XR) 75 MG 24 hr capsule, Take 3 capsules (225 mg total) by mouth daily., Disp: 90 capsule,  Rfl: 9   Medications ordered in this encounter:  Meds ordered this encounter  Medications   amoxicillin-clavulanate (AUGMENTIN) 875-125 MG tablet    Sig: Take 1 tablet by mouth 2 (two) times daily for 5 days.    Dispense:  10 tablet    Refill:  0   ondansetron (ZOFRAN) 4 MG tablet    Sig: Take 1 tablet (4 mg total) by mouth every 8 (eight) hours as needed for nausea or vomiting.    Dispense:  20 tablet    Refill:  0     *If you need refills on other medications prior to your next appointment, please contact your pharmacy*  Follow-Up: Call back or seek an in-person  evaluation if the symptoms worsen or if the condition fails to improve as anticipated.  [facility] [phone]  Other Instructions Otitis Media, Adult  Otitis media is a condition in which the middle ear is red and swollen (inflamed) and full of fluid. The middle ear is the part of the ear that contains bones for hearing as well as air that helps send sounds to the brain. The condition usually goes away on its own. What are the causes? This condition is caused by a blockage in the eustachian tube. This tube connects the middle ear to the back of the nose. It normally allows air into the middle ear. The blockage is caused by fluid or swelling. Problems that can cause blockage include: A cold or infection that affects the nose, mouth, or throat. Allergies. An irritant, such as tobacco smoke. Adenoids that have become large. The adenoids are soft tissue located in the back of the throat, behind the nose and the roof of the mouth. Growth or swelling in the upper part of the throat, just behind the nose (nasopharynx). Damage to the ear caused by a change in pressure. This is called barotrauma. What increases the risk? You are more likely to develop this condition if you: Smoke or are exposed to tobacco smoke. Have an opening in the roof of your mouth (cleft palate). Have acid reflux. Have problems in your body's defense system (immune system). What are the signs or symptoms? Symptoms of this condition include: Ear pain. Fever. Problems with hearing. Being tired. Fluid leaking from the ear. Ringing in the ear. How is this treated? This condition can go away on its own within 3-5 days. But if the condition is caused by germs (bacteria) and does not go away on its own, or if it keeps coming back, your doctor may: Give you antibiotic medicines. Give you medicines for pain. Follow these instructions at home: Take over-the-counter and prescription medicines only as told by  your doctor. If you were prescribed an antibiotic medicine, take it as told by your doctor. Do not stop taking it even if you start to feel better. Keep all follow-up visits. Contact a doctor if: You have bleeding from your nose. There is a lump on your neck. You are not feeling better in 5 days. You feel worse instead of better. Get help right away if: You have pain that is not helped with medicine. You have swelling, redness, or pain around your ear. You get a stiff neck. You cannot move part of your face (paralysis). You notice that the bone behind your ear hurts when you touch it. You get a very bad headache. Summary Otitis media means that the middle ear is red, swollen, and full of fluid. This condition usually goes away  on its own. If the problem does not go away, treatment may be needed. You may be given medicines to treat the infection or to treat your pain. If you were prescribed an antibiotic medicine, take it as told by your doctor. Do not stop taking it even if you start to feel better. Keep all follow-up visits. This information is not intended to replace advice given to you by your health care provider. Make sure you discuss any questions you have with your health care provider. Document Revised: 04/30/2020 Document Reviewed: 04/30/2020 Elsevier Patient Education  2024 Elsevier Inc. Nausea and Vomiting, Adult Nausea is the feeling that you have an upset stomach or that you are about to vomit. As nausea gets worse, it can lead to vomiting. Vomiting is when stomach contents forcefully come out of your mouth as a result of nausea. Vomiting can make you feel weak and cause you to become dehydrated. Dehydration can make you feel tired and thirsty, cause you to have a dry mouth, and decrease how often you urinate. Older adults and people with other diseases or a weak disease-fighting system (immune system) are at higher risk for dehydration. It is important to treat your nausea  and vomiting as told by your health care provider. Follow these instructions at home: Watch your symptoms for any changes. Tell your health care provider about them. Eating and drinking     Take an oral rehydration solution (ORS). This is a drink that is sold at pharmacies and retail stores. Drink clear fluids slowly and in small amounts as you are able. Clear fluids include water, ice chips, low-calorie sports drinks, and fruit juice that has water added (diluted fruit juice). Eat bland, easy-to-digest foods in small amounts as you are able. These foods include bananas, applesauce, rice, lean meats, toast, and crackers. Avoid fluids that contain a lot of sugar or caffeine, such as energy drinks, sports drinks, and soda. Avoid alcohol. Avoid spicy or fatty foods. General instructions Take over-the-counter and prescription medicines only as told by your health care provider. Drink enough fluid to keep your urine pale yellow. Wash your hands often using soap and water for at least 20 seconds. If soap and water are not available, use hand sanitizer. Make sure that everyone in your household washes their hands well and often. Rest at home while you recover. Watch your condition for any changes. Take slow and deep breaths when you feel nauseous. Keep all follow-up visits. This is important. Contact a health care provider if: Your symptoms get worse. You have new symptoms. You have a fever. You cannot drink fluids without vomiting. Your nausea does not go away after 2 days. You feel light-headed or dizzy. You have a headache. You have muscle cramps. You have a rash. You have pain while urinating. Get help right away if: You have pain in your chest, neck, arm, or jaw. You feel extremely weak or you faint. You have persistent vomiting. You have vomit that is bright red or looks like black coffee grounds. You have bloody or black stools (feces) or stools that look like tar. You have a  severe headache, a stiff neck, or both. You have severe pain, cramping, or bloating in your abdomen. You have difficulty breathing, or you are breathing very quickly. Your heart is beating very quickly. Your skin feels cold and clammy. You feel confused. You have signs of dehydration, such as: Dark urine, very little urine, or no urine. Cracked lips. Dry mouth. Sunken eyes. Sleepiness.  Weakness. These symptoms may be an emergency. Get help right away. Call 911. Do not wait to see if the symptoms will go away. Do not drive yourself to the hospital. Summary Nausea is the feeling that you have an upset stomach or that you are about to vomit. As nausea gets worse, it can lead to vomiting. Vomiting can make you feel weak and cause you to become dehydrated. Follow instructions from your health care provider about eating and drinking to prevent dehydration. Take over-the-counter and prescription medicines only as told by your health care provider. Contact your health care provider if your symptoms get worse, or you have new symptoms. Keep all follow-up visits. This is important. This information is not intended to replace advice given to you by your health care provider. Make sure you discuss any questions you have with your health care provider. Document Revised: 07/27/2020 Document Reviewed: 07/27/2020 Elsevier Patient Education  2024 Elsevier Inc.     If you have been instructed to have an in-person evaluation today at a local Urgent Care facility, please use the link below. It will take you to a list of all of our available [facility], including address, phone number and hours of operation. Please do not delay care.  [facility]  If you or a family member do not have a primary care provider, use the link below to schedule a visit and establish care. When you choose a [facility] primary care physician or advanced practice provider, you gain a long-term partner  in health. Find a Primary Care Provider  Learn more about [facility]'s in-office and virtual care options:  - Get Care Now

## 2023-01-21 ENCOUNTER — Emergency Department
Admission: EM | Admit: 2023-01-21 | Discharge: 2023-01-21 | Disposition: A | Discharge status: Home / Self Care | Payer: MEDICAID | Attending: Student | Admitting: Student

## 2023-01-21 ENCOUNTER — Other Ambulatory Visit: Payer: Self-pay

## 2023-01-21 DIAGNOSIS — T300 Burn of unspecified body region, unspecified degree: Secondary | ICD-10-CM

## 2023-01-21 DIAGNOSIS — X158XXA Contact with other hot household appliances, initial encounter: Secondary | ICD-10-CM | POA: Insufficient documentation

## 2023-01-21 DIAGNOSIS — T2115XA Burn of first degree of buttock, initial encounter: Secondary | ICD-10-CM | POA: Insufficient documentation

## 2023-01-21 DIAGNOSIS — T310 Burns involving less than 10% of body surface: Secondary | ICD-10-CM | POA: Insufficient documentation

## 2023-01-21 DIAGNOSIS — T2105XA Burn of unspecified degree of buttock, initial encounter: Secondary | ICD-10-CM | POA: Diagnosis present

## 2023-01-21 MED ORDER — CEPHALEXIN 500 MG PO CAPS
500.0000 mg | ORAL_CAPSULE | Freq: Four times a day (QID) | ORAL | 0 refills | Status: AC
Start: 2023-01-21 — End: 2023-01-28

## 2023-01-21 MED ORDER — NAPROXEN 500 MG PO TABS
500.0000 mg | ORAL_TABLET | Freq: Two times a day (BID) | ORAL | 0 refills | Status: AC
Start: 2023-01-21 — End: 2023-01-26

## 2023-01-21 MED ORDER — BACITRACIN 500 UNIT/GM EX OINT
1.0000 | TOPICAL_OINTMENT | Freq: Two times a day (BID) | CUTANEOUS | 0 refills | Status: AC
Start: 2023-01-21 — End: ?
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
## 2023-01-21 NOTE — Discharge Instructions (Signed)
Please take the medications as prescribed. Return for new, worsening, or change in symptoms or other concerns. It was a pleasure caring for you today.
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
## 2023-02-14 ENCOUNTER — Emergency Department: Payer: MEDICAID

## 2023-02-14 ENCOUNTER — Other Ambulatory Visit: Payer: Self-pay

## 2023-02-14 ENCOUNTER — Emergency Department
Admission: EM | Admit: 2023-02-14 | Discharge: 2023-02-14 | Disposition: A | Discharge status: Home / Self Care | Payer: MEDICAID | Attending: Emergency Medicine | Admitting: Emergency Medicine

## 2023-02-14 DIAGNOSIS — W010XXA Fall on same level from slipping, tripping and stumbling without subsequent striking against object, initial encounter: Secondary | ICD-10-CM | POA: Insufficient documentation

## 2023-02-14 DIAGNOSIS — S63502A Unspecified sprain of left wrist, initial encounter: Secondary | ICD-10-CM | POA: Insufficient documentation

## 2023-02-14 DIAGNOSIS — S6992XA Unspecified injury of left wrist, hand and finger(s), initial encounter: Secondary | ICD-10-CM | POA: Diagnosis present

## 2023-02-14 MED ORDER — NAPROXEN 500 MG PO TABS
500.0000 mg | ORAL_TABLET | Freq: Two times a day (BID) | ORAL | 0 refills | Status: AC
Start: 2023-02-14 — End: 2023-02-19
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
## 2023-02-14 NOTE — Discharge Instructions (Addendum)
 Please follow up with orthopedics if your symptoms persist. Your xray was negative. It was a pleasure caring for you today.
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
## 2023-02-14 NOTE — ED Notes (Addendum)
 Ace wrap provided by EDP.  Pt received d/c paperwork and instructions from EDP.  Pt had no additional questions.
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
## 2023-04-24 ENCOUNTER — Ambulatory Visit
Admission: RE | Admit: 2023-04-24 | Discharge: 2023-04-24 | Disposition: A | Discharge status: Home / Self Care | Payer: MEDICAID | Source: Ambulatory Visit

## 2023-04-24 VITALS — BP 119/83 | HR 71 | Temp 98.7°F | Resp 14 | Ht 68.0 in | Wt 309.1 lb

## 2023-04-24 DIAGNOSIS — L0291 Cutaneous abscess, unspecified: Secondary | ICD-10-CM | POA: Diagnosis not present

## 2023-04-24 DIAGNOSIS — R062 Wheezing: Secondary | ICD-10-CM

## 2023-04-24 MED ORDER — DOXYCYCLINE HYCLATE 100 MG PO CAPS
100.0000 mg | ORAL_CAPSULE | Freq: Two times a day (BID) | ORAL | 0 refills | Status: AC
Start: 2023-04-24 — End: ?

## 2023-04-24 MED ORDER — ALBUTEROL SULFATE HFA 108 (90 BASE) MCG/ACT IN AERS: 1.0000 | INHALATION_SPRAY | Freq: Four times a day (QID) | RESPIRATORY_TRACT | 0 refills | Status: DC | PRN

## 2023-04-24 NOTE — ED Triage Notes (Signed)
 Patient reports abscess on her labia that started 3 days ago.  Patient also reports cough and chest congestion for a week.  Patient reports low grade fevers.

## 2023-04-24 NOTE — Discharge Instructions (Addendum)
 You have been diagnosed with a right vaginal abscess.  You have been treated with doxycycline 100 mg twice daily.  You may also benefit from the Hibiclens as we discussed. You have some wheezing.  You have been prescribed albuterol inhaler We encourage conservative treatment with symptom relief. We encourage you to use Tylenol alternating with Ibuprofen for your fever if not contraindicated. (Remember to use as directed do not exceed daily dosing recommendations) Your cough can be soothed with a cough suppressant, and throat lozenges.

## 2023-04-24 NOTE — ED Provider Notes (Signed)
 [facility]    CSN: [id number] Arrival date & time: 04/24/23  1112      History   Chief Complaint Chief Complaint  Patient presents with   Abscess    Appointment   Cough    HPI Catherine Mitchell is a 38 y.o. female.   HPI  She is in today for evaluation of right-sided vaginal abscess.  She endorses that she has had this approximately 8 years ago.  She does continue to shave. Denies any pelvic pain or tenderness, amenorrhea irregular bleeding or prolonged heavy bleeding.  Denies vaginal discharge or dysuria. She endorses that she was recently diagnosed and continues to have a cough.  She reports that the cough is worse at night she does have a yellowish phlegm.  She denies history of Asthma.  She denies any fever, chills, nasal congestion, chest pain or shortness of breath Past Medical History:  Diagnosis Date   Anxiety    Depression    HSV (herpes simplex virus) anogenital infection    Hypoglycemia    Kidney stone    Vaginal Pap smear, abnormal     Patient Active Problem List   Diagnosis Date Noted   Abnormal uterine bleeding 11/19/2022   Recurrent pregnancy loss 11/19/2022   BMI 45.0-49.9, adult (HCC) 11/19/2022   Non-recurrent acute suppurative otitis media of both ears without spontaneous rupture of tympanic membranes 10/30/2022   Acute upper respiratory infection 10/30/2022   Opioid use disorder 09/12/2021   PTSD (post-traumatic stress disorder) 09/12/2021   Severe recurrent major depression without psychotic features (HCC) 09/11/2021   MDD (major depressive disorder), recurrent episode, severe (HCC) 09/10/2021   Suicidal behavior with attempted self-injury (HCC) 09/10/2021   Intentional overdose (HCC)    Kidney stone 11/16/2018   Yeast vaginitis 12/17/2016   H N P-CERVICAL 09/18/2008   Sprain of neck 08/30/2008    Past Surgical History:  Procedure Laterality Date   INTRAUTERINE DEVICE INSERTION  04/20/2013   ParaGard   nexplanon removal  2015    URETHRA SURGERY  had it stretched as a child   WISDOM TOOTH EXTRACTION      OB History     Gravida  3   Para      Term      Preterm      AB  3   Living         SAB  3   IAB      Ectopic      Multiple      Live Births               Home Medications    Prior to Admission medications   Medication Sig Start Date End Date Taking? Authorizing Provider  albuterol (VENTOLIN HFA) 108 (90 Base) MCG/ACT inhaler Inhale 1-2 puffs into the lungs every 6 (six) hours as needed for wheezing or shortness of breath. 04/24/23  Yes Barbette Merino, NP  ARIPiprazole (ABILIFY) 10 MG tablet Take 10 mg by mouth daily. 04/13/23  Yes [provider]  doxycycline (VIBRAMYCIN) 100 MG capsule Take 1 capsule (100 mg total) by mouth 2 (two) times daily. 04/24/23  Yes Barbette Merino, NP  bacitracin 500 UNIT/GM ointment Apply 1 Application topically 2 (two) times daily. 01/21/23   Poggi, Herb Grays, PA-C  bacitracin ointment Apply to affected area twice daily 12/08/22 12/08/23  Minna Antis, MD  carbamazepine (TEGRETOL) 200 MG tablet Take 1 tablet (200 mg total) by mouth 2 (two) times daily. 09/19/21  Clapacs, [name], MD  cetirizine (ZYRTEC) 10 MG tablet Take 1 tablet (10 mg total) by mouth daily. 07/31/22   Bing Neighbors, NP  clonazePAM (KLONOPIN) 0.5 MG tablet Take 1 tablet (0.5 mg total) by mouth 3 (three) times daily. 09/19/21   Clapacs, [name], MD  clonazePAM (KLONOPIN) 1 MG tablet Take 1 mg by mouth 3 (three) times daily.    [provider]  doxazosin (CARDURA) 4 MG tablet Take 1 tablet (4 mg total) by mouth daily. 05/08/22   Raspet, Denny Peon K, PA-C  fluticasone (FLONASE) 50 MCG/ACT nasal spray Place 2 sprays into both nostrils daily. 01/02/23   Reed Pandy, PA-C  gabapentin (NEURONTIN) 300 MG capsule Take 2 capsules (600 mg total) by mouth 3 (three) times daily. 04/30/22     HYDROcodone-acetaminophen (NORCO/VICODIN) 5-325 MG tablet Take 1 tablet by mouth every 4 (four)  hours as needed. 12/08/22   Minna Antis, MD  hydrOXYzine (ATARAX) 50 MG tablet Take 1 tablet (50 mg total) by mouth 3 (three) times daily as needed. 04/30/22     lidocaine (LIDODERM) 5 % Place 1 patch onto the skin every 12 (twelve) hours. Remove & Discard patch within 12 hours or as directed by MD 05/08/22 05/08/23  Chesley Noon, MD  ondansetron (ZOFRAN) 4 MG tablet Take 1 tablet (4 mg total) by mouth every 8 (eight) hours as needed for nausea or vomiting. 01/03/23   Reed Pandy, PA-C  predniSONE (STERAPRED UNI-PAK 21 TAB) 10 MG (21) TBPK tablet Take following package directions. 01/02/23   Reed Pandy, PA-C  promethazine-dextromethorphan (PROMETHAZINE-DM) 6.25-15 MG/5ML syrup Take 5 mLs by mouth 4 (four) times daily as needed for cough. 07/31/22   Bing Neighbors, NP  venlafaxine XR (EFFEXOR-XR) 75 MG 24 hr capsule Take 150 mg by mouth daily with breakfast.     [provider]  venlafaxine XR (EFFEXOR-XR) 75 MG 24 hr capsule Take 3 capsules (225 mg total) by mouth daily with breakfast. 09/19/21   Clapacs, [name], MD  venlafaxine XR (EFFEXOR-XR) 75 MG 24 hr capsule Take 3 capsules (225 mg total) by mouth daily. 02/18/22     norethindrone-ethinyl estradiol (JUNEL FE,GILDESS FE,LOESTRIN FE) 1-20 MG-MCG tablet Take 1 tablet by mouth daily. 08/18/16 07/27/18  Harrington Challenger, NP  Norethindrone-Ethinyl Estradiol-Fe Biphas (LO LOESTRIN FE) 1 MG-10 MCG / 10 MCG tablet Take 1 tablet by mouth daily. 01/04/19 04/18/19  Harrington Challenger, NP  topiramate (TOPAMAX) 50 MG tablet Take 50 mg by mouth daily.  07/27/18  [provider]    Family History Family History  Problem Relation Age of Onset   Diabetes Maternal Grandmother    Hypertension Father    Diabetes Mother    Thyroid disease Mother    Ovarian cancer Mother     Social History Social History   Tobacco Use   Smoking status: Never   Smokeless tobacco: Never  Vaping Use   Vaping status: Never Used  Substance Use Topics    Alcohol use: Not Currently   Drug use: Yes    Types: Marijuana     Allergies   Elemental sulfur, Sulfa antibiotics, Sulfacetamide sodium-sulfur, Latex, Sulfa antibiotics, Sulfa antibiotics, and Tomato   Review of Systems Review of Systems   Physical Exam Triage Vital Signs ED Triage Vitals  Encounter Vitals Group     BP 04/24/23 1121 119/83     Systolic BP Percentile --      Diastolic BP Percentile --      Pulse  Rate 04/24/23 1121 71     Resp 04/24/23 1121 14     Temp 04/24/23 1121 98.7 F (37.1 C)     Temp Source 04/24/23 1121 Oral     SpO2 04/24/23 1121 97 %     Weight 04/24/23 1118 (!) 309 lb 1.4 oz (140.2 kg)     Height 04/24/23 1118 5\' 8"  (1.727 m)     Head Circumference --      Peak Flow --      Pain Score 04/24/23 1118 10     Pain Loc --      Pain Education --      Exclude from Growth Chart --    No data found.  Updated Vital Signs BP 119/83 (BP Location: Left Arm)   Pulse 71   Temp 98.7 F (37.1 C) (Oral)   Resp 14   Ht 5\' 8"  (1.727 m)   Wt (!) 309 lb 1.4 oz (140.2 kg)   LMP 03/31/2023 (Approximate)   SpO2 97%   BMI 47.00 kg/m   Visual Acuity Right Eye Distance:   Left Eye Distance:   Bilateral Distance:    Right Eye Near:   Left Eye Near:    Bilateral Near:     Physical Exam Constitutional:      General: She is not in acute distress.    Appearance: She is obese. She is not ill-appearing, toxic-appearing or diaphoretic.  HENT:     Head: Normocephalic and atraumatic.  Cardiovascular:     Rate and Rhythm: Normal rate and regular rhythm.     Pulses: Normal pulses.  Pulmonary:     Effort: Pulmonary effort is normal.     Breath sounds: Wheezing present.  Genitourinary:    Comments: Deferred Musculoskeletal:        General: Normal range of motion.     Cervical back: Normal range of motion.  Skin:    General: Skin is warm.     Capillary Refill: Capillary refill takes less than 2 seconds.  Neurological:     General: No focal deficit  present.     Mental Status: She is alert.  Psychiatric:        Mood and Affect: Mood normal.        Behavior: Behavior normal.      UC Treatments / Results  Labs (all labs ordered are listed, but only abnormal results are displayed) Labs Reviewed - No data to display  EKG   Radiology No results found.  Procedures Procedures (including critical care time)  Medications Ordered in UC Medications - No data to display  Initial Impression / Assessment and Plan / UC Course  I have reviewed the triage vital signs and the nursing notes.  Pertinent labs & imaging results that were available during my care of the patient were reviewed by me and considered in my medical decision making (see chart for details).     Abscess Final Clinical Impressions(s) / UC Diagnoses   Final diagnoses:  Abscess  Wheezing     Discharge Instructions      You have been diagnosed with a right vaginal abscess.  You have been treated with doxycycline 100 mg twice daily.  You may also benefit from the Hibiclens as we discussed. You have some wheezing.  You have been prescribed albuterol inhaler We encourage conservative treatment with symptom relief. We encourage you to use Tylenol alternating with Ibuprofen for your fever if not contraindicated. (Remember to use as directed do not exceed  daily dosing recommendations) Your cough can be soothed with a cough suppressant, and throat lozenges.     ED Prescriptions     Medication Sig Dispense Auth. Provider   doxycycline (VIBRAMYCIN) 100 MG capsule Take 1 capsule (100 mg total) by mouth 2 (two) times daily. 20 capsule Barbette Merino, NP   albuterol (VENTOLIN HFA) 108 (90 Base) MCG/ACT inhaler Inhale 1-2 puffs into the lungs every 6 (six) hours as needed for wheezing or shortness of breath. 8 g Barbette Merino, NP      PDMP not reviewed this encounter.   Thad Ranger [place], [place] 04/24/23 [phone]

## 2023-06-15 ENCOUNTER — Ambulatory Visit: Payer: MEDICAID | Admitting: Obstetrics

## 2023-07-01 ENCOUNTER — Ambulatory Visit
Admission: RE | Admit: 2023-07-01 | Discharge: 2023-07-01 | Disposition: A | Discharge status: Home / Self Care | Payer: MEDICAID | Source: Ambulatory Visit

## 2023-07-01 VITALS — BP 141/81 | HR 71 | Temp 98.1°F | Resp 19

## 2023-07-01 DIAGNOSIS — B349 Viral infection, unspecified: Secondary | ICD-10-CM

## 2023-07-01 LAB — GROUP A STREP BY PCR: Group A Strep by PCR: NOT DETECTED

## 2023-07-01 MED ORDER — AZELASTINE HCL 0.1 % NA SOLN: 1.0000 | Freq: Two times a day (BID) | NASAL | 1 refills | Status: AC

## 2023-07-01 MED ORDER — BENZONATATE 200 MG PO CAPS
200.0000 mg | ORAL_CAPSULE | Freq: Three times a day (TID) | ORAL | 0 refills | Status: AC | PRN
Start: 2023-07-01 — End: ?

## 2023-07-01 NOTE — Discharge Instructions (Addendum)
  1. Acute viral syndrome (Primary) - Group A Strep by PCR performed in UC is negative for strep pharyngitis - azelastine (ASTELIN) 0.1 % nasal spray; Place 1 spray into both nostrils 2 (two) times daily. Use in each nostril as directed  Dispense: 30 mL; Refill: 1 - benzonatate  (TESSALON ) 200 MG capsule; Take 1 capsule (200 mg total) by mouth 3 (three) times daily as needed for cough.  Dispense: 20 capsule; Refill: 0 - Take daily antihistamine either Claritin, Zyrtec , Allegra for nasal congestion and postnasal drip symptoms. - Drink plenty of fluids, get plenty of rest, take extra vitamin C and eat a healthy diet to help with body fight viral illness. - Follow-up for further evaluation if you develop persistent fever, severe fatigue, chest pain, shortness of breath, wheezing, weakness, dizziness or severe intractable headache.

## 2023-07-01 NOTE — ED Triage Notes (Signed)
 Sx x 2 days  Fever Productive cough Sore throat

## 2023-07-01 NOTE — ED Provider Notes (Signed)
 [facility]  Note:  This document was prepared using Conservation officer, historic buildings and may include unintentional dictation errors.  MRN: [id number] DOB: [birth year]  Subjective:   Catherine Mitchell is a 38 y.o. female presenting for fever, cough, sore throat, body aches, headache x 2 days.  Patient reports the cough is worse at night keeping her awake and she is unable to sleep.  Patient is been taking Mucinex  and Delsym  with minimal improvement to symptoms.  Denies any known sick contacts.  No shortness of breath, chest pain, weakness, dizziness.  No current facility-administered medications for this encounter.  Current Outpatient Medications:    ARIPiprazole (ABILIFY) 10 MG tablet, Take 10 mg by mouth daily., Disp: , Rfl:    clonazePAM  (KLONOPIN ) 0.5 MG tablet, Take 1 tablet (0.5 mg total) by mouth 3 (three) times daily., Disp: 90 tablet, Rfl: 1   venlafaxine  XR (EFFEXOR -XR) 75 MG 24 hr capsule, Take 3 capsules (225 mg total) by mouth daily with breakfast., Disp: 90 capsule, Rfl: 1   albuterol  (VENTOLIN  HFA) 108 (90 Base) MCG/ACT inhaler, Inhale 1-2 puffs into the lungs every 6 (six) hours as needed for wheezing or shortness of breath., Disp: 8 g, Rfl: 0   azelastine (ASTELIN) 0.1 % nasal spray, Place 1 spray into both nostrils 2 (two) times daily. Use in each nostril as directed, Disp: 30 mL, Rfl: 1   bacitracin  500 UNIT/GM ointment, Apply 1 Application topically 2 (two) times daily., Disp: 15 g, Rfl: 0   bacitracin  ointment, Apply to affected area twice daily, Disp: 30 g, Rfl: 0   benzonatate  (TESSALON ) 200 MG capsule, Take 1 capsule (200 mg total) by mouth 3 (three) times daily as needed for cough., Disp: 20 capsule, Rfl: 0   carbamazepine  (TEGRETOL ) 200 MG tablet, Take 1 tablet (200 mg total) by mouth 2 (two) times daily., Disp: 60 tablet, Rfl: 1   cetirizine  (ZYRTEC ) 10 MG tablet, Take 1 tablet (10 mg total) by mouth daily., Disp: 30 tablet, Rfl: 0   clonazePAM  (KLONOPIN )  1 MG tablet, Take 1 mg by mouth 3 (three) times daily., Disp: , Rfl:    doxazosin  (CARDURA ) 4 MG tablet, Take 1 tablet (4 mg total) by mouth daily., Disp: 10 tablet, Rfl: 0   doxycycline  (VIBRAMYCIN ) 100 MG capsule, Take 1 capsule (100 mg total) by mouth 2 (two) times daily., Disp: 20 capsule, Rfl: 0   fluticasone  (FLONASE ) 50 MCG/ACT nasal spray, Place 2 sprays into both nostrils daily., Disp: 16 g, Rfl: 0   gabapentin  (NEURONTIN ) 300 MG capsule, Take 2 capsules (600 mg total) by mouth 3 (three) times daily., Disp: 180 capsule, Rfl: 11   HYDROcodone -acetaminophen  (NORCO/VICODIN) 5-325 MG tablet, Take 1 tablet by mouth every 4 (four) hours as needed., Disp: 10 tablet, Rfl: 0   hydrOXYzine  (ATARAX ) 50 MG tablet, Take 1 tablet (50 mg total) by mouth 3 (three) times daily as needed., Disp: 90 tablet, Rfl: 11   ondansetron  (ZOFRAN ) 4 MG tablet, Take 1 tablet (4 mg total) by mouth every 8 (eight) hours as needed for nausea or vomiting., Disp: 20 tablet, Rfl: 0   predniSONE  (STERAPRED UNI-PAK 21 TAB) 10 MG (21) TBPK tablet, Take following package directions., Disp: 21 tablet, Rfl: 0   promethazine -dextromethorphan  (PROMETHAZINE -DM) 6.25-15 MG/5ML syrup, Take 5 mLs by mouth 4 (four) times daily as needed for cough., Disp: 240 mL, Rfl: 0   venlafaxine  XR (EFFEXOR -XR) 75 MG 24 hr capsule, Take 150 mg by mouth daily with breakfast. , Disp: ,  Rfl:    venlafaxine  XR (EFFEXOR -XR) 75 MG 24 hr capsule, Take 3 capsules (225 mg total) by mouth daily., Disp: 90 capsule, Rfl: 9   Allergies  Allergen Reactions   Elemental Sulfur Other (See Comments)    Throat swells,blisters   Levofloxacin Nausea Only   Sulfa Antibiotics Anaphylaxis   Sulfacetamide Sodium-Sulfur Hives   Sulfur Hives   Latex     BREAKS OUT HANDS   Sulfa Antibiotics Hives   Sulfa Antibiotics    Tomato     GI UPSET    Past Medical History:  Diagnosis Date   Anxiety    Depression    HSV (herpes simplex virus) anogenital infection     Hypoglycemia    Kidney stone    Vaginal Pap smear, abnormal      Past Surgical History:  Procedure Laterality Date   INTRAUTERINE DEVICE INSERTION  04/20/2013   ParaGard   nexplanon removal  2015   URETHRA SURGERY  had it stretched as a child   WISDOM TOOTH EXTRACTION      Family History  Problem Relation Age of Onset   Diabetes Maternal Grandmother    Hypertension Father    Diabetes Mother    Thyroid disease Mother    Ovarian cancer Mother     Social History   Tobacco Use   Smoking status: Never   Smokeless tobacco: Never  Vaping Use   Vaping status: Never Used  Substance Use Topics   Alcohol use: Not Currently   Drug use: Yes    Types: Marijuana    ROS Refer to HPI for ROS details.  Objective:   Vitals: BP (!) 141/81 (BP Location: Right Wrist)   Pulse 71   Temp 98.1 F (36.7 C) (Oral)   Resp 19   LMP 06/15/2023 (Approximate)   SpO2 95%   Physical Exam Vitals and nursing note reviewed.  Constitutional:      General: She is not in acute distress.    Appearance: She is well-developed. She is not ill-appearing or toxic-appearing.  HENT:     Head: Normocephalic and atraumatic.     Nose: Congestion and rhinorrhea present.     Mouth/Throat:     Mouth: Mucous membranes are moist.     Pharynx: Posterior oropharyngeal erythema present. No oropharyngeal exudate.  Eyes:     General:        Right eye: No discharge.        Left eye: No discharge.     Extraocular Movements: Extraocular movements intact.     Conjunctiva/sclera: Conjunctivae normal.  Cardiovascular:     Rate and Rhythm: Normal rate and regular rhythm.     Heart sounds: Normal heart sounds. No murmur heard. Pulmonary:     Effort: Pulmonary effort is normal. No respiratory distress.     Breath sounds: Normal breath sounds. No stridor. No wheezing, rhonchi or rales.  Skin:    General: Skin is warm and dry.  Neurological:     General: No focal deficit present.     Mental Status: She is alert  and oriented to person, place, and time.  Psychiatric:        Mood and Affect: Mood normal.        Behavior: Behavior normal.     Procedures  Results for orders placed or performed during the hospital encounter of 07/01/23 (from the past 24 hours)  Group A Strep by PCR     Status: None   Collection Time: 07/01/23  6:42  PM   Specimen: Throat; Sterile Swab  Result Value Ref Range   Group A Strep by PCR NOT DETECTED NOT DETECTED    Assessment and Plan :     Discharge Instructions       1. Acute viral syndrome (Primary) - Group A Strep by PCR performed in UC is negative for strep pharyngitis - azelastine (ASTELIN) 0.1 % nasal spray; Place 1 spray into both nostrils 2 (two) times daily. Use in each nostril as directed  Dispense: 30 mL; Refill: 1 - benzonatate  (TESSALON ) 200 MG capsule; Take 1 capsule (200 mg total) by mouth 3 (three) times daily as needed for cough.  Dispense: 20 capsule; Refill: 0 - Take daily antihistamine either Claritin, Zyrtec , Allegra for nasal congestion and postnasal drip symptoms. - Drink plenty of fluids, get plenty of rest, take extra vitamin C and eat a healthy diet to help with body fight viral illness. - Follow-up for further evaluation if you develop persistent fever, severe fatigue, chest pain, shortness of breath, wheezing, weakness, dizziness or severe intractable headache.    Cruise Baumgardner B Gerline Ratto   Eliza Green, [place], [place] 07/01/23 [phone]

## 2023-07-03 NOTE — Progress Notes (Signed)
 GYNECOLOGY PROGRESS NOTE  Subjective:  PCP: Pcp, No  Patient ID: Catherine Mitchell, female    DOB: 01/21/1986, 38 y.o.   MRN: [id number]  HPI  Patient is a 38 y.o. G45P0030 female who presents with her mother for f/up from 11/19/22 for AUB, EMB results and U/S results. She is trying to find out why she can not get pregnant. She has had 2 miscarriages. She and her husband have been trying for years, have unprotected intercourse sometimes multiple times per day, and pt thinks he did have prior semen analysis, unsure of results, but recalls "semen was too thick" maybe. She is not doing OPKs or timing intercourse. She has not seen REI or had prior infertility evaluation in the past.   Cycles are Q28d, last 3-4 days, are sometimes heavy in flow, and will need to change pad Q1hr on heaviest days. Cramping can be severe. Denies hx of consistently skipping periods.   OB History  Gravida Para Term Preterm AB Living  3    3   SAB IAB Ectopic Multiple Live Births  3        # Outcome Date GA Lbr Len/2nd Weight Sex Type Anes PTL Lv  3 SAB           2 SAB           1 SAB            Past Medical History:  Diagnosis Date   Anxiety    Depression    HSV (herpes simplex virus) anogenital infection    Hypoglycemia    Kidney stone    Vaginal Pap smear, abnormal    Patient Active Problem List   Diagnosis Date Noted   Abnormal uterine bleeding 11/19/2022   Recurrent pregnancy loss 11/19/2022   BMI 45.0-49.9, adult (HCC) 11/19/2022   Non-recurrent acute suppurative otitis media of both ears without spontaneous rupture of tympanic membranes 10/30/2022   Acute upper respiratory infection 10/30/2022   Opioid use disorder 09/12/2021   PTSD (post-traumatic stress disorder) 09/12/2021   Severe recurrent major depression without psychotic features (HCC) 09/11/2021   MDD (major depressive disorder), recurrent episode, severe (HCC) 09/10/2021   Suicidal behavior with attempted self-injury (HCC)  09/10/2021   Intentional overdose (HCC)    Kidney stone 11/16/2018   Yeast vaginitis 12/17/2016   H N P-CERVICAL 09/18/2008   Sprain of neck 08/30/2008   Family History  Problem Relation Age of Onset   Diabetes Maternal Grandmother    Hypertension Father    Diabetes Mother    Thyroid disease Mother    Ovarian cancer Mother    Past Surgical History:  Procedure Laterality Date   INTRAUTERINE DEVICE INSERTION  04/20/2013   ParaGard   nexplanon removal  2015   URETHRA SURGERY  had it stretched as a child   WISDOM TOOTH EXTRACTION     Social History   Socioeconomic History   Marital status: Single    Spouse name: Not on file   Number of children: Not on file   Years of education: Not on file   Highest education level: Not on file  Occupational History   Not on file  Tobacco Use   Smoking status: Never   Smokeless tobacco: Never  Vaping Use   Vaping status: Never Used  Substance and Sexual Activity   Alcohol use: Not Currently   Drug use: Yes    Types: Marijuana   Sexual activity: Yes    Partners: Male  Birth control/protection: None  Other Topics Concern   Not on file  Social History Narrative   ** Merged History Encounter **       ** Merged History Encounter **       Social Drivers of Corporate investment banker Strain: Not on file  Food Insecurity: Not on file  Transportation Needs: Not on file  Physical Activity: Not on file  Stress: Not on file  Social Connections: Not on file  Intimate Partner Violence: Not on file   Current Outpatient Medications on File Prior to Visit  Medication Sig Dispense Refill   albuterol  (VENTOLIN  HFA) 108 (90 Base) MCG/ACT inhaler Inhale 1-2 puffs into the lungs every 6 (six) hours as needed for wheezing or shortness of breath. 8 g 0   ARIPiprazole (ABILIFY) 10 MG tablet Take 10 mg by mouth daily.     azelastine  (ASTELIN ) 0.1 % nasal spray Place 1 spray into both nostrils 2 (two) times daily. Use in each nostril as  directed 30 mL 1   cetirizine  (ZYRTEC ) 10 MG tablet Take 1 tablet (10 mg total) by mouth daily. 30 tablet 0   clonazePAM  (KLONOPIN ) 0.5 MG tablet Take 1 tablet (0.5 mg total) by mouth 3 (three) times daily. 90 tablet 1   fluticasone  (FLONASE ) 50 MCG/ACT nasal spray Place 2 sprays into both nostrils daily. 16 g 0   venlafaxine  XR (EFFEXOR -XR) 75 MG 24 hr capsule Take 3 capsules (225 mg total) by mouth daily with breakfast. 90 capsule 1   bacitracin  500 UNIT/GM ointment Apply 1 Application topically 2 (two) times daily. (Patient not taking: Reported on 07/07/2023) 15 g 0   bacitracin  ointment Apply to affected area twice daily (Patient not taking: Reported on 07/07/2023) 30 g 0   benzonatate  (TESSALON ) 200 MG capsule Take 1 capsule (200 mg total) by mouth 3 (three) times daily as needed for cough. (Patient not taking: Reported on 07/07/2023) 20 capsule 0   carbamazepine  (TEGRETOL ) 200 MG tablet Take 1 tablet (200 mg total) by mouth 2 (two) times daily. (Patient not taking: Reported on 07/07/2023) 60 tablet 1   clonazePAM  (KLONOPIN ) 1 MG tablet Take 1 mg by mouth 3 (three) times daily. (Patient not taking: Reported on 07/07/2023)     doxazosin  (CARDURA ) 4 MG tablet Take 1 tablet (4 mg total) by mouth daily. (Patient not taking: Reported on 07/07/2023) 10 tablet 0   doxycycline  (VIBRAMYCIN ) 100 MG capsule Take 1 capsule (100 mg total) by mouth 2 (two) times daily. (Patient not taking: Reported on 07/07/2023) 20 capsule 0   gabapentin  (NEURONTIN ) 300 MG capsule Take 2 capsules (600 mg total) by mouth 3 (three) times daily. (Patient not taking: Reported on 07/07/2023) 180 capsule 11   HYDROcodone -acetaminophen  (NORCO/VICODIN) 5-325 MG tablet Take 1 tablet by mouth every 4 (four) hours as needed. 10 tablet 0   hydrOXYzine  (ATARAX ) 50 MG tablet Take 1 tablet (50 mg total) by mouth 3 (three) times daily as needed. (Patient not taking: Reported on 07/07/2023) 90 tablet 11   ondansetron  (ZOFRAN ) 4 MG tablet Take 1 tablet (4 mg  total) by mouth every 8 (eight) hours as needed for nausea or vomiting. (Patient not taking: Reported on 07/07/2023) 20 tablet 0   predniSONE  (STERAPRED UNI-PAK 21 TAB) 10 MG (21) TBPK tablet Take following package directions. (Patient not taking: Reported on 07/07/2023) 21 tablet 0   promethazine -dextromethorphan  (PROMETHAZINE -DM) 6.25-15 MG/5ML syrup Take 5 mLs by mouth 4 (four) times daily as needed for cough. 240 mL  0   venlafaxine  XR (EFFEXOR -XR) 75 MG 24 hr capsule Take 150 mg by mouth daily with breakfast.      [DISCONTINUED] norethindrone-ethinyl estradiol (JUNEL FE,GILDESS FE,LOESTRIN FE) 1-20 MG-MCG tablet Take 1 tablet by mouth daily. 3 Package 1   [DISCONTINUED] Norethindrone-Ethinyl Estradiol-Fe Biphas (LO LOESTRIN FE) 1 MG-10 MCG / 10 MCG tablet Take 1 tablet by mouth daily. 3 Package 4   [DISCONTINUED] topiramate (TOPAMAX) 50 MG tablet Take 50 mg by mouth daily.     No current facility-administered medications on file prior to visit.   Allergies  Allergen Reactions   Elemental Sulfur Other (See Comments)    Throat swells,blisters   Levofloxacin Nausea Only   Sulfa Antibiotics Anaphylaxis   Sulfacetamide Sodium-Sulfur Hives   Sulfur Hives   Latex     BREAKS OUT HANDS   Sulfa Antibiotics Hives   Sulfa Antibiotics    Tomato     GI UPSET    Review of Systems Pertinent items are noted in HPI.   Objective:   Blood pressure 114/65, pulse 81, height 5\' 8"  (1.727 m), weight (!) 326 lb (147.9 kg), last menstrual period 06/23/2023. Body mass index is 49.57 kg/m.  General appearance: alert, cooperative, no distress, and morbidly obese Abdomen: soft, non-tender; bowel sounds normal; no masses,  no organomegaly Pelvic: deferred Extremities: extremities normal, atraumatic, no cyanosis or edema Neurologic: Grossly normal  12/02/22 US  PELVIS FINDINGS: Uterus Measurements: 7.4 x 3.6 x 4.0 cm = volume: 56.7 mL. Uterus is anteverted. No discrete fibroid or other myometrial  abnormality. 1 cm mildly complex nabothian cyst noted at the cervix.   Endometrium Thickness: 5.3 mm.  No focal abnormality visualized.   Right ovary Measurements: 2.5 x 1.7 x 2.1 cm = volume: 4.7 mL. Normal appearance/no adnexal mass.   Left ovary Measurements: 2.8 x 1.9 x 1.5 cm = volume: 4.1 mL. Normal appearance/no adnexal mass.   Other findings No abnormal free fluid.   IMPRESSION: 1. Normal pelvic ultrasound.   Latest Reference Range & Units 11/19/22 11:39  Anticardiolipin Ab, IgA APL <10  Antiphosphatidylserine IgG GPS 0  Antiphosphatidylserine IgM MPS 0  Antiprothrombin Antibody, IgG G units 6  Anticardiolipin Ab, IgG GPL <10  Anticardiolipin Ab, IgM MPL <10  Beta-2 Glycoprotein I, IgG SGU <10  Beta-2 Glycoprotein I, IgA SAU <10  Beta-2 Glycoprotein I, IgM SMU <10  Hexagonal Phospholipid Neutral sec 9  APTT 1:1 NP sec CANCELED  APTT 1:1 Saline sec CANCELED  DRVVT Confirm Seconds sec CANCELED  DRVVT Ratio ratio CANCELED  DRVVT Screen Seconds sec 46.7  Platelet Neutralization sec 5.8 (H)  FACTOR V LEIDEN  Comment  LAC Interpretation  Comment  Protein C-Functional 73 - 180 % 143  Protein S-Functional 63 - 140 % 115  APTT sec 28.9  Prolactin 4.8 - 33.4 ng/mL 6.5  Progesterone  ng/mL 0.3  TSH 0.450 - 4.500 uIU/mL 0.419 (L)  T4,Free(Direct) 0.82 - 1.77 ng/dL 1.47   Assessment/Plan:   1. Recurrent pregnancy loss   2. BMI 45.0-49.9, adult (HCC)   3. Marijuana use    38 y.o. G3P0030 with normal pelvic US  and normal labs for recurrent miscarriage, here to discuss results and desiring pregnancy. We discussed possible causes for difficulty conceiving and/or keeping her pregnancies including hormone imbalance, anovulation, PCOS, abnormal sperm, and unknown causes. Pt is having regular Q28d cycles, but is not doing OPKs or timing intercourse.  -Encouraged pt to have partner complete an updated semen analysis -Discussed how excess subcutaneous tissue can contribute  to  hormone imbalance and encouraged her to lower BMI. She will discuss pharmacologic weight loss possibilities with her PCP.  -Stop smoking/MJ to optimize health, pt states she will stop today.  -Continue prenatal vitamin daily.  -Extensively reviewed period tracking, timing intercourse, OPKs.  F/u 6 mos if not pregnant, sooner prn   Total time was 35 minutes. That includes chart review before the visit, the actual patient visit, and time spent on documentation after the visit. Time excludes procedures, if any.    Sofia Dunn, DO [facility]

## 2023-07-07 ENCOUNTER — Encounter: Payer: Self-pay | Admitting: Obstetrics

## 2023-07-07 ENCOUNTER — Ambulatory Visit (INDEPENDENT_AMBULATORY_CARE_PROVIDER_SITE_OTHER): Payer: MEDICAID | Admitting: Obstetrics

## 2023-07-07 VITALS — BP 114/65 | HR 81 | Ht 68.0 in | Wt 326.0 lb

## 2023-07-07 DIAGNOSIS — F1290 Cannabis use, unspecified, uncomplicated: Secondary | ICD-10-CM

## 2023-07-07 DIAGNOSIS — Z6841 Body Mass Index (BMI) 40.0 and over, adult: Secondary | ICD-10-CM

## 2023-07-07 DIAGNOSIS — N96 Recurrent pregnancy loss: Secondary | ICD-10-CM

## 2023-07-07 NOTE — Addendum Note (Signed)
 Addended by: Rian Chafe D on: 07/07/2023 02:09 PM   Modules accepted: Orders

## 2023-08-13 ENCOUNTER — Encounter: Payer: Self-pay | Admitting: Emergency Medicine

## 2023-08-13 ENCOUNTER — Emergency Department
Admission: EM | Admit: 2023-08-13 | Discharge: 2023-08-13 | Disposition: A | Discharge status: Home / Self Care | Attending: Emergency Medicine | Admitting: Emergency Medicine

## 2023-08-13 ENCOUNTER — Other Ambulatory Visit: Payer: Self-pay

## 2023-08-13 DIAGNOSIS — R21 Rash and other nonspecific skin eruption: Secondary | ICD-10-CM | POA: Insufficient documentation

## 2023-08-13 MED ORDER — HYDROCORTISONE 1 % EX CREA: 1.0000 | TOPICAL_CREAM | Freq: Four times a day (QID) | CUTANEOUS | 0 refills | Status: AC

## 2023-08-13 MED ORDER — PREDNISONE 20 MG PO TABS
60.0000 mg | ORAL_TABLET | Freq: Once | ORAL | Status: AC
  Administered 2023-08-13: 60 mg via ORAL
  Filled 2023-08-13: qty 3

## 2023-08-13 NOTE — ED Provider Notes (Signed)
 [facility] Emergency Department Provider Note     Event Date/Time   First MD Initiated Contact with Patient 08/13/23 2045     (approximate)   History   Rash   HPI  Catherine Mitchell is a 38 y.o. female presents to the ED for evaluation of a rash to her lower back and buttock area that was noticed 4 days ago.  Pruritic in nature.  Patient has tried Benadryl and calamine lotion with no relief.  She denies new medications, or being outdoors.  No fevers.  Patient does state that she has used laundry detergent that gets stuck in her paint lining at her waist but otherwise unknown trigger.     Physical Exam   Triage Vital Signs: ED Triage Vitals  Encounter Vitals Group     BP 08/13/23 1831 129/80     Girls Systolic BP Percentile --      Girls Diastolic BP Percentile --      Boys Systolic BP Percentile --      Boys Diastolic BP Percentile --      Pulse Rate 08/13/23 1831 74     Resp 08/13/23 1831 18     Temp 08/13/23 1831 97.7 F (36.5 C)     Temp Source 08/13/23 1831 Oral     SpO2 08/13/23 1831 100 %     Weight 08/13/23 1830 290 lb (131.5 kg)     Height 08/13/23 1830 5' 8 (1.727 m)     Head Circumference --      Peak Flow --      Pain Score 08/13/23 1830 3     Pain Loc --      Pain Education --      Exclude from Growth Chart --     Most recent vital signs: Vitals:   08/13/23 1831 08/13/23 2119  BP: 129/80 131/77  Pulse: 74 77  Resp: 18 18  Temp: 97.7 F (36.5 C) 98 F (36.7 C)  SpO2: 100% 100%    General Awake, no distress.  HEENT NCAT. CV:  Good peripheral perfusion.  RESP:  Normal effort.  ABD:  No distention.  Other:  Erythemic diffuse, flat rash localized going across the lower back crossing the midline.  Blanchable.  Pruritic in nature.  Mild tenderness to palpation.  No blistering or peeling.   ED Results / Procedures / Treatments   Labs (all labs ordered are listed, but only abnormal results are displayed) Labs  Reviewed - No data to display  No results found.  PROCEDURES:  Critical Care performed: No  Procedures  MEDICATIONS ORDERED IN ED: Medications  predniSONE  (DELTASONE ) tablet 60 mg (60 mg Oral Given 08/13/23 2113)    IMPRESSION / MDM / ASSESSMENT AND PLAN / ED COURSE  I reviewed the triage vital signs and the nursing notes.                                 38 y.o. female presents to the emergency department for evaluation and treatment of rash. See HPI for further details.   Differential diagnosis includes, but is not limited to contact dermatitis, Stevens-Johnson, cellulitis, shingles considered but less likely given crossing the midline  Patient's presentation is most consistent with acute complicated illness / injury requiring diagnostic workup.  Patient is alert and oriented.  She is hemodynamic stable and afebrile.  Physical exam findings are stated above and consistent with  contact dermatitis.  I do suspect given detergent stuck in the way sliding that potentially could have rubbed against her skin versus heat rash.  Will give oral antibiotic in ED and prescribe hydrocortisone  for topical application.  Encouraged follow-up with dermatology if no improvement in 3 days.  ED precaution discussed.  Patient stable condition.  FINAL CLINICAL IMPRESSION(S) / ED DIAGNOSES   Final diagnoses:  Rash   Rx / DC Orders   ED Discharge Orders          Ordered    hydrocortisone  cream 1 %  4 times daily        08/13/23 2052             Note:  This document was prepared using Dragon voice recognition software and may include unintentional dictation errors.    Margrette, Arran Fessel A, PA-C 08/13/23 2359    Waymond Lorelle Cummins, MD 08/14/23 [phone]

## 2023-08-13 NOTE — ED Triage Notes (Signed)
 Pt to ER with c/o rash to lower back and buttocks.  Pt states rash first appeared approximately 4 das\ys ago and itches.

## 2023-08-13 NOTE — Discharge Instructions (Signed)
 Your evaluated in the ED for a rash to your lower back and buttock region.  Your presentation is consistent with contact dermatitis.  Continue to take Benadryl 50 mg every 6-8 hours for itchiness.  Apply topical steroid cream over the area and then apply Vaseline as a barrier.  Apply cool compresses for temporary relief.  Alternate Tylenol  and ibuprofen  for pain as needed.  If symptoms do not improve in 1 week's time with this treatment regimen please follow-up with dermatology for further evaluation.  Continue calamine lotion.

## 2023-08-17 ENCOUNTER — Ambulatory Visit: Admission: EM | Admit: 2023-08-17 | Discharge: 2023-08-17 | Disposition: A | Discharge status: Home / Self Care

## 2023-08-17 ENCOUNTER — Encounter: Payer: Self-pay | Admitting: Emergency Medicine

## 2023-08-17 DIAGNOSIS — R21 Rash and other nonspecific skin eruption: Secondary | ICD-10-CM | POA: Diagnosis not present

## 2023-08-17 MED ORDER — FEXOFENADINE HCL 180 MG PO TABS
180.0000 mg | ORAL_TABLET | Freq: Every day | ORAL | 0 refills | Status: AC
Start: 2023-08-17 — End: ?

## 2023-08-17 MED ORDER — PREDNISONE 20 MG PO TABS
ORAL_TABLET | ORAL | 0 refills | Status: AC
Start: 2023-08-17 — End: 2023-08-27

## 2023-08-17 NOTE — Discharge Instructions (Addendum)
  1. Rash and nonspecific skin eruption (Primary) - fexofenadine  (ALLEGRA ) 180 MG tablet; Take 1 tablet (180 mg total) by mouth daily.  If continuing to have persistent itching and irritation you may take a second dose before bed to help with overnight itching.  Dispense: 60 tablet; Refill: 0 - predniSONE  (DELTASONE ) 20 MG tablet; Take 3 tablets (60 mg total) by mouth daily for 3 days, THEN 2 tablets (40 mg total) daily for 4 days, THEN 1 tablet (20 mg total) daily for 3 days.  Dispense: 20 tablet; Refill: 0 - Apply cool compresses to the area of increased itching and irritation to decrease symptoms. - If symptoms continue to manifest follow-up with primary care for further evaluation and management.

## 2023-08-17 NOTE — ED Provider Notes (Signed)
 [facility]  Note:  This document was prepared using Conservation officer, historic buildings and may include unintentional dictation errors.  MRN: [id number] DOB: 1985-06-28  Subjective:   Catherine Mitchell is a 38 y.o. female presenting for pruritic, erythematous rash over bilateral arms, abdomen and chest x 1 week.  Patient states that she was seen in the emergency department on 08/13/2023 and given prednisone  but symptoms did not improve.  Patient denies any known exposure to any allergic substances including poison ivy.  Patient denies any blistering or drainage.  No current facility-administered medications for this encounter.  Current Outpatient Medications:    fexofenadine  (ALLEGRA ) 180 MG tablet, Take 1 tablet (180 mg total) by mouth daily., Disp: 60 tablet, Rfl: 0   predniSONE  (DELTASONE ) 20 MG tablet, Take 3 tablets (60 mg total) by mouth daily for 3 days, THEN 2 tablets (40 mg total) daily for 4 days, THEN 1 tablet (20 mg total) daily for 3 days., Disp: 20 tablet, Rfl: 0   albuterol  (VENTOLIN  HFA) 108 (90 Base) MCG/ACT inhaler, Inhale 1-2 puffs into the lungs every 6 (six) hours as needed for wheezing or shortness of breath., Disp: 8 g, Rfl: 0   ARIPiprazole (ABILIFY) 10 MG tablet, Take 10 mg by mouth daily., Disp: , Rfl:    azelastine  (ASTELIN ) 0.1 % nasal spray, Place 1 spray into both nostrils 2 (two) times daily. Use in each nostril as directed, Disp: 30 mL, Rfl: 1   bacitracin  500 UNIT/GM ointment, Apply 1 Application topically 2 (two) times daily. (Patient not taking: Reported on 07/07/2023), Disp: 15 g, Rfl: 0   bacitracin  ointment, Apply to affected area twice daily (Patient not taking: Reported on 07/07/2023), Disp: 30 g, Rfl: 0   benzonatate  (TESSALON ) 200 MG capsule, Take 1 capsule (200 mg total) by mouth 3 (three) times daily as needed for cough. (Patient not taking: Reported on 07/07/2023), Disp: 20 capsule, Rfl: 0   carbamazepine  (TEGRETOL ) 200 MG tablet, Take 1 tablet  (200 mg total) by mouth 2 (two) times daily. (Patient not taking: Reported on 07/07/2023), Disp: 60 tablet, Rfl: 1   cetirizine  (ZYRTEC ) 10 MG tablet, Take 1 tablet (10 mg total) by mouth daily., Disp: 30 tablet, Rfl: 0   clonazePAM  (KLONOPIN ) 0.5 MG tablet, Take 1 tablet (0.5 mg total) by mouth 3 (three) times daily., Disp: 90 tablet, Rfl: 1   clonazePAM  (KLONOPIN ) 1 MG tablet, Take 1 mg by mouth 3 (three) times daily. (Patient not taking: Reported on 07/07/2023), Disp: , Rfl:    doxazosin  (CARDURA ) 4 MG tablet, Take 1 tablet (4 mg total) by mouth daily. (Patient not taking: Reported on 07/07/2023), Disp: 10 tablet, Rfl: 0   doxycycline  (VIBRAMYCIN ) 100 MG capsule, Take 1 capsule (100 mg total) by mouth 2 (two) times daily. (Patient not taking: Reported on 07/07/2023), Disp: 20 capsule, Rfl: 0   fluticasone  (FLONASE ) 50 MCG/ACT nasal spray, Place 2 sprays into both nostrils daily., Disp: 16 g, Rfl: 0   gabapentin  (NEURONTIN ) 300 MG capsule, Take 2 capsules (600 mg total) by mouth 3 (three) times daily. (Patient not taking: Reported on 07/07/2023), Disp: 180 capsule, Rfl: 11   HYDROcodone -acetaminophen  (NORCO/VICODIN) 5-325 MG tablet, Take 1 tablet by mouth every 4 (four) hours as needed., Disp: 10 tablet, Rfl: 0   hydrocortisone  cream 1 %, Apply 1 Application topically 4 (four) times daily., Disp: 30 g, Rfl: 0   hydrOXYzine  (ATARAX ) 50 MG tablet, Take 1 tablet (50 mg total) by mouth 3 (three) times daily  as needed. (Patient not taking: Reported on 07/07/2023), Disp: 90 tablet, Rfl: 11   ondansetron  (ZOFRAN ) 4 MG tablet, Take 1 tablet (4 mg total) by mouth every 8 (eight) hours as needed for nausea or vomiting. (Patient not taking: Reported on 07/07/2023), Disp: 20 tablet, Rfl: 0   promethazine -dextromethorphan  (PROMETHAZINE -DM) 6.25-15 MG/5ML syrup, Take 5 mLs by mouth 4 (four) times daily as needed for cough., Disp: 240 mL, Rfl: 0   venlafaxine  XR (EFFEXOR -XR) 75 MG 24 hr capsule, Take 150 mg by mouth daily with  breakfast. , Disp: , Rfl:    venlafaxine  XR (EFFEXOR -XR) 75 MG 24 hr capsule, Take 3 capsules (225 mg total) by mouth daily with breakfast., Disp: 90 capsule, Rfl: 1   Allergies  Allergen Reactions   Elemental Sulfur Other (See Comments)    Throat swells,blisters   Levofloxacin Nausea Only   Sulfa Antibiotics Anaphylaxis   Sulfacetamide Sodium-Sulfur Hives   Sulfur Hives   Latex     BREAKS OUT HANDS   Sulfa Antibiotics Hives   Sulfa Antibiotics    Tomato     GI UPSET    Past Medical History:  Diagnosis Date   Anxiety    Depression    HSV (herpes simplex virus) anogenital infection    Hypoglycemia    Kidney stone    Vaginal Pap smear, abnormal      Past Surgical History:  Procedure Laterality Date   INTRAUTERINE DEVICE INSERTION  04/20/2013   ParaGard   nexplanon removal  2015   URETHRA SURGERY  had it stretched as a child   WISDOM TOOTH EXTRACTION      Family History  Problem Relation Age of Onset   Diabetes Maternal Grandmother    Hypertension Father    Diabetes Mother    Thyroid disease Mother    Ovarian cancer Mother     Social History   Tobacco Use   Smoking status: Never   Smokeless tobacco: Never  Vaping Use   Vaping status: Never Used  Substance Use Topics   Alcohol use: Not Currently   Drug use: Yes    Types: Marijuana    ROS Refer to HPI for ROS details.  Objective:   Vitals: BP 122/77 (BP Location: Right Arm)   Pulse 71   Temp 98.4 F (36.9 C) (Oral)   Resp 18   LMP 07/28/2023 (Approximate)   SpO2 96%   Physical Exam Vitals and nursing note reviewed.  Constitutional:      General: She is not in acute distress.    Appearance: Normal appearance. She is not ill-appearing.  HENT:     Head: Normocephalic.  Cardiovascular:     Rate and Rhythm: Normal rate.  Pulmonary:     Effort: Pulmonary effort is normal. No respiratory distress.  Skin:    General: Skin is warm and dry.     Capillary Refill: Capillary refill takes less than 2  seconds.     Findings: Erythema and rash present. Rash is macular and urticarial.  Neurological:     General: No focal deficit present.     Mental Status: She is alert and oriented to person, place, and time.  Psychiatric:        Mood and Affect: Mood normal.        Behavior: Behavior normal.     Procedures  No results found for this or any previous visit (from the past 24 hours).  Assessment and Plan :     Discharge Instructions  1. Rash and nonspecific skin eruption (Primary) - fexofenadine  (ALLEGRA ) 180 MG tablet; Take 1 tablet (180 mg total) by mouth daily.  If continuing to have persistent itching and irritation you may take a second dose before bed to help with overnight itching.  Dispense: 60 tablet; Refill: 0 - predniSONE  (DELTASONE ) 20 MG tablet; Take 3 tablets (60 mg total) by mouth daily for 3 days, THEN 2 tablets (40 mg total) daily for 4 days, THEN 1 tablet (20 mg total) daily for 3 days.  Dispense: 20 tablet; Refill: 0 - Apply cool compresses to the area of increased itching and irritation to decrease symptoms. - If symptoms continue to manifest follow-up with primary care for further evaluation and management.      Alyzah Pelly B Sophiana Milanese   Emslee Lopezmartinez, [place] B, [place] 08/17/23 1406

## 2023-08-17 NOTE — ED Triage Notes (Signed)
 Pt presents with a rash all over her body x 1 week. She was seen at the ED on 08/13/23 and prescribed prednisone , but it did not help.

## 2023-09-27 ENCOUNTER — Ambulatory Visit
Admission: EM | Admit: 2023-09-27 | Discharge: 2023-09-27 | Disposition: A | Discharge status: Home / Self Care | Attending: Emergency Medicine | Admitting: Emergency Medicine

## 2023-09-27 ENCOUNTER — Encounter: Payer: Self-pay | Admitting: Emergency Medicine

## 2023-09-27 DIAGNOSIS — Z711 Person with feared health complaint in whom no diagnosis is made: Secondary | ICD-10-CM | POA: Diagnosis present

## 2023-09-27 DIAGNOSIS — N898 Other specified noninflammatory disorders of vagina: Secondary | ICD-10-CM | POA: Diagnosis not present

## 2023-09-27 LAB — URINALYSIS, W/ REFLEX TO CULTURE (INFECTION SUSPECTED)
Bilirubin Urine: NEGATIVE
Glucose, UA: NEGATIVE mg/dL
Ketones, ur: NEGATIVE mg/dL
Nitrite: NEGATIVE
Protein, ur: 30 mg/dL — AB
Specific Gravity, Urine: 1.025 (ref 1.005–1.030)
pH: 6 (ref 5.0–8.0)

## 2023-09-27 LAB — PREGNANCY, URINE: Preg Test, Ur: NEGATIVE

## 2023-09-27 MED ORDER — AZITHROMYCIN 500 MG PO TABS
1000.0000 mg | ORAL_TABLET | Freq: Once | ORAL | Status: AC
  Administered 2023-09-27: 1000 mg via ORAL

## 2023-09-27 MED ORDER — CEFTRIAXONE SODIUM 500 MG IJ SOLR
500.0000 mg | Freq: Once | INTRAMUSCULAR | Status: AC
  Administered 2023-09-27: 500 mg via INTRAMUSCULAR

## 2023-09-27 MED ORDER — METRONIDAZOLE 500 MG PO TABS: 500.0000 mg | ORAL_TABLET | Freq: Two times a day (BID) | ORAL | 0 refills | Status: AC

## 2023-09-27 NOTE — ED Triage Notes (Signed)
 Pt c/o vaginal itching and yellow vaginal discharge. Started about 2 days ago. She states its been about 2 weeks since intercourse.

## 2023-09-27 NOTE — ED Provider Notes (Signed)
 [facility]    CSN: [id number] Arrival date & time: 09/27/23  1103      History   Chief Complaint Chief Complaint  Patient presents with   Vaginal Discharge   Vaginal Itching    HPI Catherine Mitchell is a 38 y.o. female.   HPI  38 year old female with past medical history significant for HSV, anxiety, depression, MDD, PTSD, BV, and yeast vaginitis presents for evaluation of vaginal itching with yellow vaginal discharge that she noticed 2 days ago.  She is also had some nausea along with urinary urgency, frequency, and bladder pressure.  She has not had any fever, vomiting, or noticed blood in her urine.  She would like to be tested for STIs.  Last unprotected sexual intercourse was 2 weeks ago.  Past Medical History:  Diagnosis Date   Anxiety    Depression    HSV (herpes simplex virus) anogenital infection    Hypoglycemia    Kidney stone    Vaginal Pap smear, abnormal     Patient Active Problem List   Diagnosis Date Noted   Marijuana use 07/07/2023   Abnormal uterine bleeding 11/19/2022   Recurrent pregnancy loss 11/19/2022   BMI 45.0-49.9, adult (HCC) 11/19/2022   Non-recurrent acute suppurative otitis media of both ears without spontaneous rupture of tympanic membranes 10/30/2022   Acute upper respiratory infection 10/30/2022   Opioid use disorder 09/12/2021   PTSD (post-traumatic stress disorder) 09/12/2021   Severe recurrent major depression without psychotic features (HCC) 09/11/2021   MDD (major depressive disorder), recurrent episode, severe (HCC) 09/10/2021   Suicidal behavior with attempted self-injury (HCC) 09/10/2021   Intentional overdose (HCC)    Kidney stone 11/16/2018   Yeast vaginitis 12/17/2016   H N P-CERVICAL 09/18/2008   Sprain of neck 08/30/2008    Past Surgical History:  Procedure Laterality Date   INTRAUTERINE DEVICE INSERTION  04/20/2013   ParaGard   nexplanon removal  2015   URETHRA SURGERY  had it stretched as a child    WISDOM TOOTH EXTRACTION      OB History     Gravida  3   Para      Term      Preterm      AB  3   Living         SAB  3   IAB      Ectopic      Multiple      Live Births               Home Medications    Prior to Admission medications   Medication Sig Start Date End Date Taking? Authorizing Provider  ARIPiprazole (ABILIFY) 10 MG tablet Take 10 mg by mouth daily. 04/13/23  Yes [provider]  clonazePAM  (KLONOPIN ) 0.5 MG tablet Take 1 tablet (0.5 mg total) by mouth 3 (three) times daily. 09/19/21  Yes Clapacs, Norleen DASEN, MD  metroNIDAZOLE  (FLAGYL ) 500 MG tablet Take 1 tablet (500 mg total) by mouth 2 (two) times daily. 09/27/23  Yes Bernardino Ditch, NP  venlafaxine  XR (EFFEXOR -XR) 75 MG 24 hr capsule Take 150 mg by mouth daily with breakfast.    Yes [provider]  venlafaxine  XR (EFFEXOR -XR) 75 MG 24 hr capsule Take 3 capsules (225 mg total) by mouth daily with breakfast. 09/19/21  Yes Clapacs, Norleen DASEN, MD  albuterol  (VENTOLIN  HFA) 108 (90 Base) MCG/ACT inhaler Inhale 1-2 puffs into the lungs every 6 (six) hours as needed for wheezing or shortness  of breath. 04/24/23   Myrna Camelia HERO, NP  azelastine  (ASTELIN ) 0.1 % nasal spray Place 1 spray into both nostrils 2 (two) times daily. Use in each nostril as directed 07/01/23   Reddick, Johnathan B, NP  bacitracin  500 UNIT/GM ointment Apply 1 Application topically 2 (two) times daily. Patient not taking: Reported on 07/07/2023 01/21/23   Poggi, Jenna E, PA-C  bacitracin  ointment Apply to affected area twice daily Patient not taking: Reported on 07/07/2023 12/08/22 12/08/23  Dorothyann Drivers, MD  benzonatate  (TESSALON ) 200 MG capsule Take 1 capsule (200 mg total) by mouth 3 (three) times daily as needed for cough. Patient not taking: Reported on 07/07/2023 07/01/23   Reddick, Johnathan B, NP  carbamazepine  (TEGRETOL ) 200 MG tablet Take 1 tablet (200 mg total) by mouth 2 (two) times daily. Patient not taking: Reported on  07/07/2023 09/19/21   Clapacs, Norleen DASEN, MD  cetirizine  (ZYRTEC ) 10 MG tablet Take 1 tablet (10 mg total) by mouth daily. 07/31/22   Arloa Suzen RAMAN, NP  clonazePAM  (KLONOPIN ) 1 MG tablet Take 1 mg by mouth 3 (three) times daily. Patient not taking: Reported on 07/07/2023    [provider]  doxazosin  (CARDURA ) 4 MG tablet Take 1 tablet (4 mg total) by mouth daily. Patient not taking: Reported on 07/07/2023 05/08/22   Raspet, Erin K, PA-C  doxycycline  (VIBRAMYCIN ) 100 MG capsule Take 1 capsule (100 mg total) by mouth 2 (two) times daily. Patient not taking: Reported on 07/07/2023 04/24/23   Myrna Camelia HERO, NP  fexofenadine  (ALLEGRA ) 180 MG tablet Take 1 tablet (180 mg total) by mouth daily. 08/17/23   Reddick, Johnathan B, NP  fluticasone  (FLONASE ) 50 MCG/ACT nasal spray Place 2 sprays into both nostrils daily. 01/02/23   Kingston Robes, PA-C  gabapentin  (NEURONTIN ) 300 MG capsule Take 2 capsules (600 mg total) by mouth 3 (three) times daily. Patient not taking: Reported on 07/07/2023 04/30/22     HYDROcodone -acetaminophen  (NORCO/VICODIN) 5-325 MG tablet Take 1 tablet by mouth every 4 (four) hours as needed. 12/08/22   Dorothyann Drivers, MD  hydrocortisone  cream 1 % Apply 1 Application topically 4 (four) times daily. 08/13/23   Margrette, Myah A, PA-C  hydrOXYzine  (ATARAX ) 50 MG tablet Take 1 tablet (50 mg total) by mouth 3 (three) times daily as needed. Patient not taking: Reported on 07/07/2023 04/30/22     ondansetron  (ZOFRAN ) 4 MG tablet Take 1 tablet (4 mg total) by mouth every 8 (eight) hours as needed for nausea or vomiting. Patient not taking: Reported on 07/07/2023 01/03/23   Kingston Robes, PA-C  norethindrone-ethinyl estradiol (JUNEL FE,GILDESS FE,LOESTRIN FE) 1-20 MG-MCG tablet Take 1 tablet by mouth daily. 08/18/16 07/27/18  Neysa Inocente PARAS, NP  Norethindrone-Ethinyl Estradiol-Fe Biphas (LO LOESTRIN FE) 1 MG-10 MCG / 10 MCG tablet Take 1 tablet by mouth daily. 01/04/19 04/18/19  Neysa Inocente PARAS, NP   topiramate (TOPAMAX) 50 MG tablet Take 50 mg by mouth daily.  07/27/18  [provider]    Family History Family History  Problem Relation Age of Onset   Diabetes Maternal Grandmother    Hypertension Father    Diabetes Mother    Thyroid disease Mother    Ovarian cancer Mother     Social History Social History   Tobacco Use   Smoking status: Never   Smokeless tobacco: Never  Vaping Use   Vaping status: Never Used  Substance Use Topics   Alcohol use: Not Currently   Drug use: Yes    Types: Marijuana  Allergies   Elemental sulfur, Levofloxacin, Sulfa antibiotics, Sulfacetamide sodium-sulfur, Sulfur, Latex, Sulfa antibiotics, Sulfa antibiotics, and Tomato   Review of Systems Review of Systems  Constitutional:  Negative for fever.  Gastrointestinal:  Positive for nausea. Negative for vomiting.  Genitourinary:  Positive for frequency, urgency, vaginal discharge and vaginal pain. Negative for dysuria and hematuria.     Physical Exam Triage Vital Signs ED Triage Vitals  Encounter Vitals Group     BP      Girls Systolic BP Percentile      Girls Diastolic BP Percentile      Boys Systolic BP Percentile      Boys Diastolic BP Percentile      Pulse      Resp      Temp      Temp src      SpO2      Weight      Height      Head Circumference      Peak Flow      Pain Score      Pain Loc      Pain Education      Exclude from Growth Chart    No data found.  Updated Vital Signs BP 125/73 (BP Location: Left Arm)   Pulse 87   Temp 98.5 F (36.9 C) (Oral)   Resp 16   Ht 5' 8 (1.727 m)   Wt 289 lb 14.5 oz (131.5 kg)   LMP 09/06/2023 (Approximate)   SpO2 96%   BMI 44.08 kg/m   Visual Acuity Right Eye Distance:   Left Eye Distance:   Bilateral Distance:    Right Eye Near:   Left Eye Near:    Bilateral Near:     Physical Exam Vitals and nursing note reviewed.  Constitutional:      Appearance: Normal appearance. She is not ill-appearing.   HENT:     Head: Normocephalic and atraumatic.  Cardiovascular:     Rate and Rhythm: Normal rate and regular rhythm.     Pulses: Normal pulses.     Heart sounds: Normal heart sounds. No murmur heard.    No friction rub. No gallop.  Pulmonary:     Effort: Pulmonary effort is normal.     Breath sounds: Normal breath sounds. No wheezing, rhonchi or rales.  Abdominal:     Palpations: Abdomen is soft.     Tenderness: There is no abdominal tenderness. There is no right CVA tenderness, left CVA tenderness, guarding or rebound.  Skin:    General: Skin is warm and dry.     Capillary Refill: Capillary refill takes less than 2 seconds.     Findings: No rash.  Neurological:     General: No focal deficit present.     Mental Status: She is alert and oriented to person, place, and time.      UC Treatments / Results  Labs (all labs ordered are listed, but only abnormal results are displayed) Labs Reviewed  URINALYSIS, W/ REFLEX TO CULTURE (INFECTION SUSPECTED) - Abnormal; Notable for the following components:      Result Value   APPearance HAZY (*)    Hgb urine dipstick TRACE (*)    Protein, ur 30 (*)    Leukocytes,Ua MODERATE (*)    Bacteria, UA FEW (*)    All other components within normal limits  URINE CULTURE  PREGNANCY, URINE  CERVICOVAGINAL ANCILLARY ONLY    EKG   Radiology No results found.  Procedures Procedures (including  critical care time)  Medications Ordered in UC Medications  cefTRIAXone  (ROCEPHIN ) injection 500 mg (has no administration in time range)  azithromycin  (ZITHROMAX ) tablet 1,000 mg (has no administration in time range)    Initial Impression / Assessment and Plan / UC Course  I have reviewed the triage vital signs and the nursing notes.  Pertinent labs & imaging results that were available during my care of the patient were reviewed by me and considered in my medical decision making (see chart for details).   Patient is a pleasant 38 year old  female presenting with request for STI testing secondary to vaginal itching and yellow vaginal discharge.  She reports that the discharge does not have an odor.  She is concerned that she may have contracted an STI from her boyfriend.  She and her boyfriend have been together for several years and their last unprotected sexual intercourse was 2 weeks ago.  She is also endorsing urinary urgency and frequency and bladder pressure but no dysuria.  She does have a history of both yeast vaginitis and bacterial vaginosis.  She is here with a family member.  She does not talk to her boyfriend to see if he admits to sleeping with other people.  I asked her if she went to be tested for HIV and syphilis in addition to the gonorrhea, chlamydia, trichomonas, BV, and yeast and she declined at this time.  I will order a cytology swab to assess for the above infections as well as a urinalysis to assess for the presence of UTI.  The patient was given the option of empirical therapy or waiting until after the test results are back for targeted therapy and she is preferring empirical therapy.  I will order 500 mg of IM ceftriaxone  as the patient is less than 150 kg and 1 g of azithromycin  to treat empirically for gonorrhea and chlamydia.  I will discharge her home on metronidazole  for treatment of presumptive trichomonas while the cytology swab is pending.  I will also order a urine pregnancy test to the fact that the patient's last menstrual cycle was on 09/06/2023 and she has a history of irregular periods and has had unprotected intercourse in the interval.  Urine pregnancy test is negative.  Urinalysis is hazy appearance with trace hemoglobin, 30 protein, moderate leukocyte esterase.  Negative for nitrates or glucose.  Reflex microscopy shows skin contamination with 11-20 squamous epithelials, 11-20 WBCs, few bacteria, and mucus present.  I will have staff recollect urine and send for culture.  I will treat the patient  presumptively for gonorrhea and chlamydia here in clinic and send her home on metronidazole  to treat presumptively for possible trichomonas pending the results of her cytology swab.   Final Clinical Impressions(s) / UC Diagnoses   Final diagnoses:  Vaginal discharge  Concern about STD in female without diagnosis     Discharge Instructions      Your urinalysis had a small amount of bacteria and some white blood cells when looked at under the microscope.  It also had a lot of skin cell contamination which could be throwing off the urine dipstick.  We had you recollect a urine sample to send for culture to determine if you have a UTI.  We have treated you presumptively for gonorrhea and chlamydia here in clinic and we are sending you home on metronidazole  twice daily for 7 days to cover for potential trichomonas while your cytology swab is pending.  Avoid any intercourse, or least unprotected intercourse,  until after you have completed treatment.  If you test positive for an infection your partner also needs to be tested and treated.     ED Prescriptions     Medication Sig Dispense Auth. Provider   metroNIDAZOLE  (FLAGYL ) 500 MG tablet Take 1 tablet (500 mg total) by mouth 2 (two) times daily. 14 tablet Bernardino Ditch, NP      PDMP not reviewed this encounter.   Bernardino Ditch, NP 09/27/23 [phone]

## 2023-09-27 NOTE — Discharge Instructions (Addendum)
 Your urinalysis had a small amount of bacteria and some white blood cells when looked at under the microscope.  It also had a lot of skin cell contamination which could be throwing off the urine dipstick.  We had you recollect a urine sample to send for culture to determine if you have a UTI.  We have treated you presumptively for gonorrhea and chlamydia here in clinic and we are sending you home on metronidazole  twice daily for 7 days to cover for potential trichomonas while your cytology swab is pending.  Avoid any intercourse, or least unprotected intercourse, until after you have completed treatment.  If you test positive for an infection your partner also needs to be tested and treated.

## 2023-09-28 LAB — CERVICOVAGINAL ANCILLARY ONLY
Bacterial Vaginitis (gardnerella): POSITIVE — AB
Candida Glabrata: NEGATIVE
Candida Vaginitis: NEGATIVE
Chlamydia: NEGATIVE
Comment: NEGATIVE
Comment: NEGATIVE
Comment: NEGATIVE
Comment: NEGATIVE
Comment: NEGATIVE
Comment: NORMAL
Neisseria Gonorrhea: NEGATIVE
Trichomonas: POSITIVE — AB

## 2023-09-28 LAB — URINE CULTURE: Special Requests: NORMAL

## 2023-09-30 ENCOUNTER — Ambulatory Visit: Payer: Self-pay

## 2023-10-21 ENCOUNTER — Ambulatory Visit
Admission: EM | Admit: 2023-10-21 | Discharge: 2023-10-21 | Disposition: A | Discharge status: Home / Self Care | Attending: Physician Assistant | Admitting: Physician Assistant

## 2023-10-21 ENCOUNTER — Ambulatory Visit

## 2023-10-21 DIAGNOSIS — Z113 Encounter for screening for infections with a predominantly sexual mode of transmission: Secondary | ICD-10-CM | POA: Diagnosis not present

## 2023-10-21 DIAGNOSIS — N760 Acute vaginitis: Secondary | ICD-10-CM | POA: Insufficient documentation

## 2023-10-21 DIAGNOSIS — R300 Dysuria: Secondary | ICD-10-CM | POA: Insufficient documentation

## 2023-10-21 LAB — URINALYSIS, W/ REFLEX TO CULTURE (INFECTION SUSPECTED)
Bilirubin Urine: NEGATIVE
Glucose, UA: NEGATIVE mg/dL
Ketones, ur: NEGATIVE mg/dL
Nitrite: NEGATIVE
Protein, ur: NEGATIVE mg/dL
Specific Gravity, Urine: 1.025 (ref 1.005–1.030)
pH: 5.5 (ref 5.0–8.0)

## 2023-10-21 MED ORDER — FLUCONAZOLE 150 MG PO TABS: ORAL_TABLET | ORAL | 0 refills | Status: DC

## 2023-10-21 NOTE — ED Triage Notes (Signed)
 Pt c/o urinary freq,burning, pain,vaginal itching & discharge x3 days. Denies any hematuria. No OTC meds.

## 2023-10-21 NOTE — ED Provider Notes (Signed)
 [facility]    CSN: [id number] Arrival date & time: 10/21/23  1423      History   Chief Complaint Chief Complaint  Patient presents with   Vaginal Itching   Dysuria    HPI Catherine Mitchell is a 38 y.o. female presenting for 3-day history of vaginal itching, burning, thick white clumpy vaginal discharge and dysuria.  Denies fever, fatigue, abdominal pain, pelvic pain, flank pain, vaginal odor or lesions.  Patient treated for trichomonas a few weeks ago.  States partner was treated as well.  She reports taking a couple different antibiotics and thinks she now has a yeast infection.  No OTC meds taken.  HPI  Past Medical History:  Diagnosis Date   Anxiety    Depression    HSV (herpes simplex virus) anogenital infection    Hypoglycemia    Kidney stone    Vaginal Pap smear, abnormal     Patient Active Problem List   Diagnosis Date Noted   Marijuana use 07/07/2023   Abnormal uterine bleeding 11/19/2022   Recurrent pregnancy loss 11/19/2022   BMI 45.0-49.9, adult (HCC) 11/19/2022   Non-recurrent acute suppurative otitis media of both ears without spontaneous rupture of tympanic membranes 10/30/2022   Acute upper respiratory infection 10/30/2022   Opioid use disorder 09/12/2021   PTSD (post-traumatic stress disorder) 09/12/2021   Severe recurrent major depression without psychotic features (HCC) 09/11/2021   MDD (major depressive disorder), recurrent episode, severe (HCC) 09/10/2021   Suicidal behavior with attempted self-injury (HCC) 09/10/2021   Intentional overdose (HCC)    Kidney stone 11/16/2018   Yeast vaginitis 12/17/2016   H N P-CERVICAL 09/18/2008   Sprain of neck 08/30/2008    Past Surgical History:  Procedure Laterality Date   INTRAUTERINE DEVICE INSERTION  04/20/2013   ParaGard   nexplanon removal  2015   URETHRA SURGERY  had it stretched as a child   WISDOM TOOTH EXTRACTION      OB History     Gravida  3   Para      Term       Preterm      AB  3   Living         SAB  3   IAB      Ectopic      Multiple      Live Births               Home Medications    Prior to Admission medications   Medication Sig Start Date End Date Taking? Authorizing Provider  fluconazole  (DIFLUCAN ) 150 MG tablet Take 1 tab po q72 hr prn yeast infection 10/21/23  Yes Arvis Jolan NOVAK, PA-C  albuterol  (VENTOLIN  HFA) 108 (90 Base) MCG/ACT inhaler Inhale 1-2 puffs into the lungs every 6 (six) hours as needed for wheezing or shortness of breath. 04/24/23   Myrna Camelia HERO, NP  ARIPiprazole (ABILIFY) 10 MG tablet Take 10 mg by mouth daily. 04/13/23   [provider]  azelastine  (ASTELIN ) 0.1 % nasal spray Place 1 spray into both nostrils 2 (two) times daily. Use in each nostril as directed 07/01/23   Reddick, Johnathan B, NP  bacitracin  500 UNIT/GM ointment Apply 1 Application topically 2 (two) times daily. Patient not taking: Reported on 07/07/2023 01/21/23   Poggi, Jenna E, PA-C  bacitracin  ointment Apply to affected area twice daily Patient not taking: Reported on 07/07/2023 12/08/22 12/08/23  Dorothyann Drivers, MD  benzonatate  (TESSALON ) 200 MG capsule Take 1 capsule (  200 mg total) by mouth 3 (three) times daily as needed for cough. Patient not taking: Reported on 07/07/2023 07/01/23   Reddick, Johnathan B, NP  carbamazepine  (TEGRETOL ) 200 MG tablet Take 1 tablet (200 mg total) by mouth 2 (two) times daily. Patient not taking: Reported on 07/07/2023 09/19/21   Clapacs, Norleen DASEN, MD  cetirizine  (ZYRTEC ) 10 MG tablet Take 1 tablet (10 mg total) by mouth daily. 07/31/22   Arloa Suzen RAMAN, NP  clonazePAM  (KLONOPIN ) 0.5 MG tablet Take 1 tablet (0.5 mg total) by mouth 3 (three) times daily. 09/19/21   Clapacs, Norleen DASEN, MD  clonazePAM  (KLONOPIN ) 1 MG tablet Take 1 mg by mouth 3 (three) times daily. Patient not taking: Reported on 07/07/2023    [provider]  doxazosin  (CARDURA ) 4 MG tablet Take 1 tablet (4 mg total) by mouth  daily. Patient not taking: Reported on 07/07/2023 05/08/22   Raspet, Erin K, PA-C  doxycycline  (VIBRAMYCIN ) 100 MG capsule Take 1 capsule (100 mg total) by mouth 2 (two) times daily. Patient not taking: Reported on 07/07/2023 04/24/23   Myrna Camelia HERO, NP  fexofenadine  (ALLEGRA ) 180 MG tablet Take 1 tablet (180 mg total) by mouth daily. 08/17/23   Reddick, Johnathan B, NP  fluticasone  (FLONASE ) 50 MCG/ACT nasal spray Place 2 sprays into both nostrils daily. 01/02/23   Kingston Robes, PA-C  gabapentin  (NEURONTIN ) 300 MG capsule Take 2 capsules (600 mg total) by mouth 3 (three) times daily. Patient not taking: Reported on 07/07/2023 04/30/22     HYDROcodone -acetaminophen  (NORCO/VICODIN) 5-325 MG tablet Take 1 tablet by mouth every 4 (four) hours as needed. 12/08/22   Dorothyann Drivers, MD  hydrocortisone  cream 1 % Apply 1 Application topically 4 (four) times daily. 08/13/23   Margrette, Myah A, PA-C  hydrOXYzine  (ATARAX ) 50 MG tablet Take 1 tablet (50 mg total) by mouth 3 (three) times daily as needed. Patient not taking: Reported on 07/07/2023 04/30/22     metroNIDAZOLE  (FLAGYL ) 500 MG tablet Take 1 tablet (500 mg total) by mouth 2 (two) times daily. 09/27/23   Bernardino Ditch, NP  ondansetron  (ZOFRAN ) 4 MG tablet Take 1 tablet (4 mg total) by mouth every 8 (eight) hours as needed for nausea or vomiting. Patient not taking: Reported on 07/07/2023 01/03/23   Kingston Robes, PA-C  venlafaxine  XR (EFFEXOR -XR) 75 MG 24 hr capsule Take 150 mg by mouth daily with breakfast.     [provider]  venlafaxine  XR (EFFEXOR -XR) 75 MG 24 hr capsule Take 3 capsules (225 mg total) by mouth daily with breakfast. 09/19/21   Clapacs, Norleen DASEN, MD  norethindrone-ethinyl estradiol (JUNEL FE,GILDESS FE,LOESTRIN FE) 1-20 MG-MCG tablet Take 1 tablet by mouth daily. 08/18/16 07/27/18  Neysa Inocente PARAS, NP  Norethindrone-Ethinyl Estradiol-Fe Biphas (LO LOESTRIN FE) 1 MG-10 MCG / 10 MCG tablet Take 1 tablet by mouth daily. 01/04/19 04/18/19   Neysa Inocente PARAS, NP  topiramate (TOPAMAX) 50 MG tablet Take 50 mg by mouth daily.  07/27/18  [provider]    Family History Family History  Problem Relation Age of Onset   Diabetes Maternal Grandmother    Hypertension Father    Diabetes Mother    Thyroid disease Mother    Ovarian cancer Mother     Social History Social History   Tobacco Use   Smoking status: Never   Smokeless tobacco: Never  Vaping Use   Vaping status: Never Used  Substance Use Topics   Alcohol use: Not Currently   Drug use: Yes  Types: Marijuana     Allergies   Elemental sulfur, Levofloxacin, Sulfa antibiotics, Sulfacetamide sodium-sulfur, Sulfur, Latex, Sulfa antibiotics, Sulfa antibiotics, and Tomato   Review of Systems Review of Systems  Constitutional:  Negative for fatigue and fever.  Gastrointestinal:  Negative for abdominal pain.  Genitourinary:  Positive for dysuria and vaginal discharge. Negative for flank pain, frequency, hematuria, urgency, vaginal bleeding and vaginal pain.  Musculoskeletal:  Negative for back pain.  Skin:  Negative for rash.     Physical Exam Triage Vital Signs ED Triage Vitals  Encounter Vitals Group     BP 10/21/23 1451 (!) 141/81     Girls Systolic BP Percentile --      Girls Diastolic BP Percentile --      Boys Systolic BP Percentile --      Boys Diastolic BP Percentile --      Pulse Rate 10/21/23 1451 60     Resp 10/21/23 1451 16     Temp 10/21/23 1451 98 F (36.7 C)     Temp Source 10/21/23 1451 Oral     SpO2 10/21/23 1451 97 %     Weight 10/21/23 1450 289 lb 14.5 oz (131.5 kg)     Height 10/21/23 1450 5' 8 (1.727 m)     Head Circumference --      Peak Flow --      Pain Score 10/21/23 1454 0     Pain Loc --      Pain Education --      Exclude from Growth Chart --    No data found.  Updated Vital Signs BP (!) 141/81 (BP Location: Right Wrist)   Pulse 60   Temp 98 F (36.7 C) (Oral)   Resp 16   Ht 5' 8 (1.727 m)   Wt 289 lb  14.5 oz (131.5 kg)   LMP 09/28/2023 (Approximate)   SpO2 97%   BMI 44.08 kg/m   Physical Exam Vitals and nursing note reviewed.  Constitutional:      General: She is not in acute distress.    Appearance: Normal appearance. She is not ill-appearing or toxic-appearing.  HENT:     Head: Normocephalic and atraumatic.  Eyes:     General: No scleral icterus.       Right eye: No discharge.        Left eye: No discharge.     Conjunctiva/sclera: Conjunctivae normal.  Cardiovascular:     Rate and Rhythm: Normal rate.  Pulmonary:     Effort: Pulmonary effort is normal. No respiratory distress.  Abdominal:     Palpations: Abdomen is soft.     Tenderness: There is no abdominal tenderness.  Musculoskeletal:     Cervical back: Neck supple.  Skin:    General: Skin is dry.  Neurological:     General: No focal deficit present.     Mental Status: She is alert. Mental status is at baseline.     Motor: No weakness.     Gait: Gait normal.  Psychiatric:        Mood and Affect: Mood normal.        Behavior: Behavior normal.      UC Treatments / Results  Labs (all labs ordered are listed, but only abnormal results are displayed) Labs Reviewed  URINALYSIS, W/ REFLEX TO CULTURE (INFECTION SUSPECTED) - Abnormal; Notable for the following components:      Result Value   Hgb urine dipstick TRACE (*)    Leukocytes,Ua TRACE (*)  Bacteria, UA FEW (*)    All other components within normal limits  URINE CULTURE  CERVICOVAGINAL ANCILLARY ONLY    EKG   Radiology No results found.  Procedures Procedures (including critical care time)  Medications Ordered in UC Medications - No data to display  Initial Impression / Assessment and Plan / UC Course  I have reviewed the triage vital signs and the nursing notes.  Pertinent labs & imaging results that were available during my care of the patient were reviewed by me and considered in my medical decision making (see chart for details).    38 year old female presents for vaginal discharge, itching and burning, dysuria for the past 3 days.  Recently took multiple antibiotics.  Was treated for trichomonas a couple weeks ago.  Urinalysis performed today shows trace hemoglobin, trace leukocytes and few bacteria.  Will send urine for culture.  Not really consistent with UTI.  Cervical vaginal swab obtained to test for GC/chlamydia/trichomonas/BV and yeast.  Suspect vaginal yeast infection.  Sent Diflucan  to pharmacy.  Will amend treatment based on swab results and culture results.   Final Clinical Impressions(s) / UC Diagnoses   Final diagnoses:  Acute vaginitis  Routine screening for STI (sexually transmitted infection)  Dysuria     Discharge Instructions      The most common types of vaginal infections are yeast infections and bacterial vaginosis. Neither of which are really considered to be sexually transmitted. Often a pH swab or wet prep is performed and if abnormal may reveal either type of infection. Begin metronidazole  if prescribed for possible BV infection. If there is concern for yeast infection, fluconazole  is often prescribed . Take this as directed. You may also apply topical miconazole (can be purchased OTC) externally for relief of itching. Increase rest and fluid intake. If labs sent out, we will call within 2-5 days with results and amend treatment if necessary. Always try to use pH balanced washes/wipes, urinate after intercourse, stay hydrated, and take probiotics if you are prone to vaginal infections. Return or see PCP or gynecologist for new/worsening infections.     ED Prescriptions     Medication Sig Dispense Auth. Provider   fluconazole  (DIFLUCAN ) 150 MG tablet Take 1 tab po q72 hr prn yeast infection 2 tablet Arvis Jolan NOVAK, PA-C      PDMP not reviewed this encounter.   Arvis Jolan NOVAK, PA-C 10/21/23 [phone]

## 2023-10-21 NOTE — Discharge Instructions (Signed)
 The most common types of vaginal infections are yeast infections and bacterial vaginosis. Neither of which are really considered to be sexually transmitted. Often a pH swab or wet prep is performed and if abnormal may reveal either type of infection. Begin metronidazole if prescribed for possible BV infection. If there is concern for yeast infection, fluconazole  is often prescribed . Take this as directed. You may also apply topical miconazole (can be purchased OTC) externally for relief of itching. Increase rest and fluid intake. If labs sent out, we will call within 2-5 days with results and amend treatment if necessary. Always try to use pH balanced washes/wipes, urinate after intercourse, stay hydrated, and take probiotics if you are prone to vaginal infections. Return or see PCP or gynecologist for new/worsening infections.

## 2023-10-22 LAB — CERVICOVAGINAL ANCILLARY ONLY
Bacterial Vaginitis (gardnerella): NEGATIVE
Candida Glabrata: NEGATIVE
Candida Vaginitis: POSITIVE — AB
Chlamydia: NEGATIVE
Comment: NEGATIVE
Comment: NEGATIVE
Comment: NEGATIVE
Comment: NEGATIVE
Comment: NEGATIVE
Comment: NORMAL
Neisseria Gonorrhea: NEGATIVE
Trichomonas: NEGATIVE

## 2024-01-18 ENCOUNTER — Ambulatory Visit
Admission: RE | Admit: 2024-01-18 | Discharge: 2024-01-18 | Disposition: A | Discharge status: Home / Self Care | Payer: MEDICAID | Source: Home / Self Care

## 2024-01-18 VITALS — BP 110/76 | HR 60 | Temp 97.6°F | Resp 18 | Wt 332.5 lb

## 2024-01-18 DIAGNOSIS — N760 Acute vaginitis: Secondary | ICD-10-CM

## 2024-01-18 DIAGNOSIS — N912 Amenorrhea, unspecified: Secondary | ICD-10-CM | POA: Insufficient documentation

## 2024-01-18 DIAGNOSIS — N3000 Acute cystitis without hematuria: Secondary | ICD-10-CM | POA: Insufficient documentation

## 2024-01-18 DIAGNOSIS — F419 Anxiety disorder, unspecified: Secondary | ICD-10-CM | POA: Insufficient documentation

## 2024-01-18 LAB — POCT URINE DIPSTICK
Bilirubin, UA: NEGATIVE
Glucose, UA: NEGATIVE mg/dL
Ketones, POC UA: NEGATIVE mg/dL
Nitrite, UA: NEGATIVE
Protein Ur, POC: 30 mg/dL — AB
Spec Grav, UA: 1.025 (ref 1.010–1.025)
Urobilinogen, UA: 0.2 U/dL
pH, UA: 6.5 (ref 5.0–8.0)

## 2024-01-18 MED ORDER — NITROFURANTOIN MONOHYD MACRO 100 MG PO CAPS: 100.0000 mg | ORAL_CAPSULE | Freq: Two times a day (BID) | ORAL | 0 refills | Status: AC

## 2024-01-18 MED ORDER — FLUCONAZOLE 150 MG PO TABS: ORAL_TABLET | ORAL | 0 refills | Status: AC

## 2024-01-18 NOTE — ED Triage Notes (Signed)
 Pt c/o burning w/urination & vaginal itching/discharge x2 days. Has tried OTC meds w/o relief. Denies any hematuria. Concerned for STD.

## 2024-01-18 NOTE — ED Provider Notes (Signed)
 [facility]    CSN: [id number] Arrival date & time: 01/18/24  1345      History   Chief Complaint Chief Complaint  Patient presents with   Dysuria   Vaginal Itching    HPI Catherine Mitchell is a 38 y.o. female presenting for 3-day history of vaginal itching, burning, thick white clumpy vaginal discharge and dysuria. Denies vaginal odor. Denies fever, fatigue, abdominal pain, pelvic pain, flank pain, vaginal odor or lesions.  Patient would also like STI testing. Denies new partners. No OTC meds taken.  HPI  Past Medical History:  Diagnosis Date   Anxiety    Depression    HSV (herpes simplex virus) anogenital infection    Hypoglycemia    Kidney stone    Vaginal Pap smear, abnormal     Patient Active Problem List   Diagnosis Date Noted   Amenorrhea 01/18/2024   Anxiety 01/18/2024   Marijuana use 07/07/2023   Recurrent pregnancy loss 11/19/2022   Non-recurrent acute suppurative otitis media of both ears without spontaneous rupture of tympanic membranes 10/30/2022   Acute upper respiratory infection 10/30/2022   Opioid use disorder 09/12/2021   PTSD (post-traumatic stress disorder) 09/12/2021   Severe recurrent major depression without psychotic features (HCC) 09/11/2021   MDD (major depressive disorder), recurrent episode, severe (HCC) 09/10/2021   Suicidal behavior with attempted self-injury (HCC) 09/10/2021   Intentional overdose (HCC)    Dysmenorrhea 06/07/2020   Attention deficit hyperactivity disorder (ADHD) 06/07/2020   Chronic pelvic pain in female 06/07/2020   Morbid obesity with BMI of 45.0-49.9, adult (HCC) 12/02/2019   Pain in right knee 09/05/2019   Unspecified internal derangement of left knee 08/25/2019   Kidney stone 11/16/2018   Yeast vaginitis 12/17/2016   H N P-CERVICAL 09/18/2008   Neck sprain 08/30/2008    Past Surgical History:  Procedure Laterality Date   INTRAUTERINE DEVICE INSERTION  04/20/2013   ParaGard   nexplanon  removal  2015   URETHRA SURGERY  had it stretched as a child   WISDOM TOOTH EXTRACTION      OB History     Gravida  3   Para      Term      Preterm      AB  3   Living         SAB  3   IAB      Ectopic      Multiple      Live Births               Home Medications    Prior to Admission medications  Medication Sig Start Date End Date Taking? Authorizing Provider  fluconazole  (DIFLUCAN ) 150 MG tablet Take 1 tab p.o. every 72 hours as needed for yeast infection 01/18/24  Yes Arvis Jolan NOVAK, PA-C  nitrofurantoin , macrocrystal-monohydrate, (MACROBID ) 100 MG capsule Take 1 capsule (100 mg total) by mouth 2 (two) times daily. 01/18/24  Yes Arvis Jolan NOVAK, PA-C  albuterol  (VENTOLIN  HFA) 108 (90 Base) MCG/ACT inhaler Inhale 1-2 puffs into the lungs every 6 (six) hours as needed for wheezing or shortness of breath. 04/24/23   Myrna Camelia HERO, NP  ARIPiprazole (ABILIFY) 10 MG tablet Take 10 mg by mouth daily. 04/13/23   [provider]  azelastine  (ASTELIN ) 0.1 % nasal spray Place 1 spray into both nostrils 2 (two) times daily. Use in each nostril as directed 07/01/23   Reddick, Johnathan B, NP  bacitracin  500 UNIT/GM ointment Apply 1 Application  topically 2 (two) times daily. Patient not taking: Reported on 07/07/2023 01/21/23   Poggi, Jenna E, PA-C  benzonatate  (TESSALON ) 200 MG capsule Take 1 capsule (200 mg total) by mouth 3 (three) times daily as needed for cough. Patient not taking: Reported on 07/07/2023 07/01/23   Reddick, Johnathan B, NP  carbamazepine  (TEGRETOL ) 200 MG tablet Take 1 tablet (200 mg total) by mouth 2 (two) times daily. Patient not taking: Reported on 07/07/2023 09/19/21   Clapacs, Norleen DASEN, MD  cetirizine  (ZYRTEC ) 10 MG tablet Take 1 tablet (10 mg total) by mouth daily. 07/31/22   Arloa Suzen RAMAN, NP  clonazePAM  (KLONOPIN ) 0.5 MG tablet Take 1 tablet (0.5 mg total) by mouth 3 (three) times daily. 09/19/21   Clapacs, Norleen DASEN, MD  clonazePAM  (KLONOPIN ) 1  MG tablet Take 1 mg by mouth 3 (three) times daily. Patient not taking: Reported on 07/07/2023    [provider]  doxazosin  (CARDURA ) 4 MG tablet Take 1 tablet (4 mg total) by mouth daily. Patient not taking: Reported on 07/07/2023 05/08/22   Raspet, Erin K, PA-C  doxycycline  (VIBRAMYCIN ) 100 MG capsule Take 1 capsule (100 mg total) by mouth 2 (two) times daily. Patient not taking: Reported on 07/07/2023 04/24/23   Myrna Camelia HERO, NP  fexofenadine  (ALLEGRA ) 180 MG tablet Take 1 tablet (180 mg total) by mouth daily. 08/17/23   Reddick, Johnathan B, NP  fluticasone  (FLONASE ) 50 MCG/ACT nasal spray Place 2 sprays into both nostrils daily. 01/02/23   Kingston Robes, PA-C  gabapentin  (NEURONTIN ) 300 MG capsule Take 2 capsules (600 mg total) by mouth 3 (three) times daily. Patient not taking: Reported on 07/07/2023 04/30/22     HYDROcodone -acetaminophen  (NORCO/VICODIN) 5-325 MG tablet Take 1 tablet by mouth every 4 (four) hours as needed. 12/08/22   Dorothyann Drivers, MD  hydrocortisone  cream 1 % Apply 1 Application topically 4 (four) times daily. 08/13/23   Margrette, Myah A, PA-C  hydrOXYzine  (ATARAX ) 50 MG tablet Take 1 tablet (50 mg total) by mouth 3 (three) times daily as needed. Patient not taking: Reported on 07/07/2023 04/30/22     metroNIDAZOLE  (FLAGYL ) 500 MG tablet Take 1 tablet (500 mg total) by mouth 2 (two) times daily. 09/27/23   Bernardino Ditch, NP  ondansetron  (ZOFRAN ) 4 MG tablet Take 1 tablet (4 mg total) by mouth every 8 (eight) hours as needed for nausea or vomiting. Patient not taking: Reported on 07/07/2023 01/03/23   Kingston Robes, PA-C  venlafaxine  XR (EFFEXOR -XR) 75 MG 24 hr capsule Take 150 mg by mouth daily with breakfast.     [provider]  venlafaxine  XR (EFFEXOR -XR) 75 MG 24 hr capsule Take 3 capsules (225 mg total) by mouth daily with breakfast. 09/19/21   Clapacs, Norleen DASEN, MD  norethindrone-ethinyl estradiol (JUNEL FE,GILDESS FE,LOESTRIN FE) 1-20 MG-MCG tablet Take 1 tablet  by mouth daily. 08/18/16 07/27/18  Neysa Inocente PARAS, NP  Norethindrone-Ethinyl Estradiol-Fe Biphas (LO LOESTRIN FE) 1 MG-10 MCG / 10 MCG tablet Take 1 tablet by mouth daily. 01/04/19 04/18/19  Neysa Inocente PARAS, NP  topiramate (TOPAMAX) 50 MG tablet Take 50 mg by mouth daily.  07/27/18  [provider]    Family History Family History  Problem Relation Age of Onset   Diabetes Maternal Grandmother    Hypertension Father    Diabetes Mother    Thyroid disease Mother    Ovarian cancer Mother     Social History Social History   Tobacco Use   Smoking status: Never  Smokeless tobacco: Never  Vaping Use   Vaping status: Never Used  Substance Use Topics   Alcohol use: Not Currently   Drug use: Yes    Types: Marijuana     Allergies   Elemental sulfur, Levofloxacin, Sulfa antibiotics, Sulfacetamide sodium-sulfur, Sulfur, Latex, Sulfa antibiotics, Sulfa antibiotics, and Tomato   Review of Systems Review of Systems  Constitutional:  Negative for fatigue and fever.  Gastrointestinal:  Negative for abdominal pain.  Genitourinary:  Positive for dysuria and vaginal discharge. Negative for flank pain, frequency, hematuria, urgency, vaginal bleeding and vaginal pain.  Musculoskeletal:  Negative for back pain.  Skin:  Negative for rash.     Physical Exam Triage Vital Signs  No data found.  Updated Vital Signs BP 110/76 (BP Location: Right Arm)   Pulse 60   Temp 97.6 F (36.4 C) (Oral)   Resp 18   Wt (!) 332 lb 8 oz (150.8 kg)   LMP 01/13/2024 (Approximate)   SpO2 96%   BMI 50.56 kg/m   Physical Exam Vitals and nursing note reviewed.  Constitutional:      General: She is not in acute distress.    Appearance: Normal appearance. She is not ill-appearing or toxic-appearing.  HENT:     Head: Normocephalic and atraumatic.  Eyes:     General: No scleral icterus.       Right eye: No discharge.        Left eye: No discharge.     Conjunctiva/sclera: Conjunctivae normal.   Cardiovascular:     Rate and Rhythm: Normal rate.  Pulmonary:     Effort: Pulmonary effort is normal. No respiratory distress.  Abdominal:     Palpations: Abdomen is soft.     Tenderness: There is no abdominal tenderness.  Musculoskeletal:     Cervical back: Neck supple.  Skin:    General: Skin is dry.  Neurological:     General: No focal deficit present.     Mental Status: She is alert. Mental status is at baseline.     Motor: No weakness.     Gait: Gait normal.  Psychiatric:        Mood and Affect: Mood normal.        Behavior: Behavior normal.      UC Treatments / Results  Labs (all labs ordered are listed, but only abnormal results are displayed) Labs Reviewed  POCT URINE DIPSTICK - Abnormal; Notable for the following components:      Result Value   Clarity, UA hazy (*)    Blood, UA trace-lysed (*)    Protein Ur, POC =30 (*)    Leukocytes, UA Small (1+) (*)    All other components within normal limits  URINE CULTURE  CERVICOVAGINAL ANCILLARY ONLY     EKG   Radiology No results found.  Procedures Procedures (including critical care time)  Medications Ordered in UC Medications - No data to display  Initial Impression / Assessment and Plan / UC Course  I have reviewed the triage vital signs and the nursing notes.  Pertinent labs & imaging results that were available during my care of the patient were reviewed by me and considered in my medical decision making (see chart for details).   39 year old female presents for vaginal discharge, itching and burning, dysuria for the past 3 days. Also requesting STI testing.  Urinalysis performed today shows hazy urine with trace RBCs, protein and small leuks.  Will send for culture.  Possible UTI.  Sent Macrobid .  May amend treatment based on culture.  Cervical vaginal swab obtained to test for GC/chlamydia/trichomonas/BV and yeast.  Suspect vaginal yeast infection.  Sent Diflucan  to pharmacy.  Will amend  treatment based on swab results and culture results.   Final Clinical Impressions(s) / UC Diagnoses   Final diagnoses:  Acute cystitis without hematuria  Acute vaginitis     Discharge Instructions      UTI: Based on either symptoms or urinalysis, you may have a urinary tract infection. We will send the urine for culture and call with results in a few days. Begin antibiotics at this time. Your symptoms should be much improved over the next 2-3 days. Increase rest and fluid intake. If for some reason symptoms are worsening or not improving after a couple of days or the urine culture determines the antibiotics you are taking will not treat the infection, the antibiotics may be changed. Return or go to ER for fever, back pain, worsening urinary pain, discharge, increased blood in urine. May take Tylenol  or Motrin  OTC for pain relief or consider AZO if no contraindications   The most common types of vaginal infections are yeast infections and bacterial vaginosis. Neither of which are really considered to be sexually transmitted. Often a pH swab or wet prep is performed and if abnormal may reveal either type of infection. Begin metronidazole  if prescribed for possible BV infection. If there is concern for yeast infection, fluconazole  is often prescribed . Take this as directed. You may also apply topical miconazole (can be purchased OTC) externally for relief of itching. Increase rest and fluid intake. If labs sent out, we will call within 2-5 days with results and amend treatment if necessary. Always try to use pH balanced washes/wipes, urinate after intercourse, stay hydrated, and take probiotics if you are prone to vaginal infections. Return or see PCP or gynecologist for new/worsening infections.       ED Prescriptions     Medication Sig Dispense Auth. Provider   fluconazole  (DIFLUCAN ) 150 MG tablet Take 1 tab p.o. every 72 hours as needed for yeast infection 2 tablet Arvis Huxley B, PA-C    nitrofurantoin , macrocrystal-monohydrate, (MACROBID ) 100 MG capsule Take 1 capsule (100 mg total) by mouth 2 (two) times daily. 10 capsule Arvis Huxley NOVAK, PA-C      PDMP not reviewed this encounter.      Arvis Huxley NOVAK, PA-C 01/18/24 [phone]

## 2024-01-18 NOTE — Discharge Instructions (Signed)
 UTI: Based on either symptoms or urinalysis, you may have a urinary tract infection. We will send the urine for culture and call with results in a few days. Begin antibiotics at this time. Your symptoms should be much improved over the next 2-3 days. Increase rest and fluid intake. If for some reason symptoms are worsening or not improving after a couple of days or the urine culture determines the antibiotics you are taking will not treat the infection, the antibiotics may be changed. Return or go to ER for fever, back pain, worsening urinary pain, discharge, increased blood in urine. May take Tylenol or Motrin OTC for pain relief or consider AZO if no contraindications   The most common types of vaginal infections are yeast infections and bacterial vaginosis. Neither of which are really considered to be sexually transmitted. Often a pH swab or wet prep is performed and if abnormal may reveal either type of infection. Begin metronidazole if prescribed for possible BV infection. If there is concern for yeast infection, fluconazole is often prescribed . Take this as directed. You may also apply topical miconazole (can be purchased OTC) externally for relief of itching. Increase rest and fluid intake. If labs sent out, we will call within 2-5 days with results and amend treatment if necessary. Always try to use pH balanced washes/wipes, urinate after intercourse, stay hydrated, and take probiotics if you are prone to vaginal infections. Return or see PCP or gynecologist for new/worsening infections.

## 2024-01-19 ENCOUNTER — Ambulatory Visit (HOSPITAL_COMMUNITY): Payer: Self-pay

## 2024-01-19 LAB — CERVICOVAGINAL ANCILLARY ONLY
Bacterial Vaginitis (gardnerella): POSITIVE — AB
Candida Glabrata: NEGATIVE
Candida Vaginitis: POSITIVE — AB
Chlamydia: NEGATIVE
Comment: NEGATIVE
Comment: NEGATIVE
Comment: NEGATIVE
Comment: NEGATIVE
Comment: NEGATIVE
Comment: NORMAL
Neisseria Gonorrhea: NEGATIVE
Trichomonas: POSITIVE — AB

## 2024-01-19 LAB — URINE CULTURE: Culture: <10000 — AB

## 2024-01-19 MED ORDER — METRONIDAZOLE 500 MG PO TABS: 500.0000 mg | ORAL_TABLET | Freq: Two times a day (BID) | ORAL | 0 refills | Status: AC

## 2024-02-11 ENCOUNTER — Telehealth: Payer: MEDICAID | Admitting: Emergency Medicine

## 2024-02-11 DIAGNOSIS — B349 Viral infection, unspecified: Secondary | ICD-10-CM

## 2024-02-11 DIAGNOSIS — J111 Influenza due to unidentified influenza virus with other respiratory manifestations: Secondary | ICD-10-CM

## 2024-02-11 MED ORDER — ALBUTEROL SULFATE HFA 108 (90 BASE) MCG/ACT IN AERS: 1.0000 | INHALATION_SPRAY | Freq: Four times a day (QID) | RESPIRATORY_TRACT | 0 refills | Status: AC | PRN

## 2024-02-11 MED ORDER — OSELTAMIVIR PHOSPHATE 75 MG PO CAPS: 75.0000 mg | ORAL_CAPSULE | Freq: Two times a day (BID) | ORAL | 0 refills | Status: AC

## 2024-02-11 NOTE — Progress Notes (Signed)
 " Virtual Visit Consent   [name], you are scheduled for a virtual visit with a [name] provider today. Just as with appointments in the office, your consent must be obtained to participate. Your consent will be active for this visit and any virtual visit you may have with one of our providers in the next 365 days. If you have a MyChart account, a copy of this consent can be sent to you electronically.  As this is a virtual visit, video technology does not allow for your provider to perform a traditional examination. This may limit your provider's ability to fully assess your condition. If your provider identifies any concerns that need to be evaluated in person or the need to arrange testing (such as labs, EKG, etc.), we will make arrangements to do so. Although advances in technology are sophisticated, we cannot ensure that it will always work on either your end or our end. If the connection with a video visit is poor, the visit may have to be switched to a telephone visit. With either a video or telephone visit, we are not always able to ensure that we have a secure connection.  By engaging in this virtual visit, you consent to the provision of healthcare and authorize for your insurance to be billed (if applicable) for the services provided during this visit. Depending on your insurance coverage, you may receive a charge related to this service.  I need to obtain your verbal consent now. Are you willing to proceed with your visit today? [name] has provided verbal consent on 02/11/2024 for a virtual visit (video or telephone). [name], NP  Date: 02/11/2024 8:18 AM   Virtual Visit via Video Note   I, [name], connected with  Catherine Mitchell  ([id number], February 11, 1985) on 02/11/2024 at  8:15 AM EST by a video-enabled telemedicine application and verified that I am speaking with the correct person using two identifiers.  Location: Patient: Virtual Visit Location  Patient: Home Provider: Virtual Visit Location Provider: Home Office   I discussed the limitations of evaluation and management by telemedicine and the availability of in person appointments. The patient expressed understanding and agreed to proceed.    History of Present Illness: Catherine Mitchell is a 39 y.o. who identifies as a female who was assigned female at birth, and is being seen today for illness.   Congestion x 2 days, blowing out yellow nasal discharge, coughing up yellow sputum, ears are popping, sore throat; no body aches or headache. No SOB. Is wheezing some - is out of her albuterol , needs refill. Has chills and has temp 102F.   Tested self for covid and was negative; has not tested self for flu  Taking dayquil and nyquil which help a little  HPI: HPI  Problems:  Patient Active Problem List   Diagnosis Date Noted   Amenorrhea 01/18/2024   Anxiety 01/18/2024   Marijuana use 07/07/2023   Recurrent pregnancy loss 11/19/2022   Non-recurrent acute suppurative otitis media of both ears without spontaneous rupture of tympanic membranes 10/30/2022   Acute upper respiratory infection 10/30/2022   Opioid use disorder 09/12/2021   PTSD (post-traumatic stress disorder) 09/12/2021   Severe recurrent major depression without psychotic features (HCC) 09/11/2021   MDD (major depressive disorder), recurrent episode, severe (HCC) 09/10/2021   Suicidal behavior with attempted self-injury (HCC) 09/10/2021   Intentional overdose (HCC)    Dysmenorrhea 06/07/2020   Attention deficit hyperactivity disorder (ADHD) 06/07/2020  Chronic pelvic pain in female 06/07/2020   Morbid obesity with BMI of 45.0-49.9, adult (HCC) 12/02/2019   Pain in right knee 09/05/2019   Unspecified internal derangement of left knee 08/25/2019   Kidney stone 11/16/2018   Yeast vaginitis 12/17/2016   H N P-CERVICAL 09/18/2008   Neck sprain 08/30/2008    Allergies: Allergies[1] Medications: Current  Medications[2]  Observations/Objective: Patient is well-developed, well-nourished in no acute distress.  Resting comfortably  at home.  Head is normocephalic, atraumatic.  No labored breathing.  Speech is clear and coherent with logical content.  Patient is alert and oriented at baseline.    Assessment and Plan: 1. Viral infection (Primary) - albuterol  (VENTOLIN  HFA) 108 (90 Base) MCG/ACT inhaler; Inhale 1-2 puffs into the lungs every 6 (six) hours as needed for wheezing or shortness of breath.  Dispense: 8 g; Refill: 0 - oseltamivir  (TAMIFLU ) 75 MG capsule; Take 1 capsule (75 mg total) by mouth 2 (two) times daily.  Dispense: 10 capsule; Refill: 0  2. Influenza-like illness - oseltamivir  (TAMIFLU ) 75 MG capsule; Take 1 capsule (75 mg total) by mouth 2 (two) times daily.  Dispense: 10 capsule; Refill: 0  Sick for <2 days. High fever and other flu like sx. Will tx empirically for flu  Follow Up Instructions: I discussed the assessment and treatment plan with the patient. The patient was provided an opportunity to ask questions and all were answered. The patient agreed with the plan and demonstrated an understanding of the instructions.  A copy of instructions were sent to the patient via MyChart unless otherwise noted below.   The patient was advised to call back or seek an in-person evaluation if the symptoms worsen or if the condition fails to improve as anticipated.    [name], NP    [1]  Allergies Allergen Reactions   Elemental Sulfur Other (See Comments)    Throat swells,blisters   Levofloxacin Nausea Only   Sulfa Antibiotics Anaphylaxis   Sulfacetamide Sodium-Sulfur Hives   Sulfur Hives   Latex     BREAKS OUT HANDS   Sulfa Antibiotics Hives   Sulfa Antibiotics    Tomato     GI UPSET  [2]  Current Outpatient Medications:    oseltamivir  (TAMIFLU ) 75 MG capsule, Take 1 capsule (75 mg total) by mouth 2 (two) times daily., Disp: 10 capsule, Rfl: 0   albuterol   (VENTOLIN  HFA) 108 (90 Base) MCG/ACT inhaler, Inhale 1-2 puffs into the lungs every 6 (six) hours as needed for wheezing or shortness of breath., Disp: 8 g, Rfl: 0   ARIPiprazole (ABILIFY) 10 MG tablet, Take 10 mg by mouth daily., Disp: , Rfl:    azelastine  (ASTELIN ) 0.1 % nasal spray, Place 1 spray into both nostrils 2 (two) times daily. Use in each nostril as directed, Disp: 30 mL, Rfl: 1   bacitracin  500 UNIT/GM ointment, Apply 1 Application topically 2 (two) times daily. (Patient not taking: Reported on 07/07/2023), Disp: 15 g, Rfl: 0   benzonatate  (TESSALON ) 200 MG capsule, Take 1 capsule (200 mg total) by mouth 3 (three) times daily as needed for cough. (Patient not taking: Reported on 07/07/2023), Disp: 20 capsule, Rfl: 0   carbamazepine  (TEGRETOL ) 200 MG tablet, Take 1 tablet (200 mg total) by mouth 2 (two) times daily. (Patient not taking: Reported on 07/07/2023), Disp: 60 tablet, Rfl: 1   cetirizine  (ZYRTEC ) 10 MG tablet, Take 1 tablet (10 mg total) by mouth daily., Disp: 30 tablet, Rfl: 0   clonazePAM  (KLONOPIN )  0.5 MG tablet, Take 1 tablet (0.5 mg total) by mouth 3 (three) times daily., Disp: 90 tablet, Rfl: 1   clonazePAM  (KLONOPIN ) 1 MG tablet, Take 1 mg by mouth 3 (three) times daily. (Patient not taking: Reported on 07/07/2023), Disp: , Rfl:    doxazosin  (CARDURA ) 4 MG tablet, Take 1 tablet (4 mg total) by mouth daily. (Patient not taking: Reported on 07/07/2023), Disp: 10 tablet, Rfl: 0   doxycycline  (VIBRAMYCIN ) 100 MG capsule, Take 1 capsule (100 mg total) by mouth 2 (two) times daily. (Patient not taking: Reported on 07/07/2023), Disp: 20 capsule, Rfl: 0   fexofenadine  (ALLEGRA ) 180 MG tablet, Take 1 tablet (180 mg total) by mouth daily., Disp: 60 tablet, Rfl: 0   fluconazole  (DIFLUCAN ) 150 MG tablet, Take 1 tab p.o. every 72 hours as needed for yeast infection, Disp: 2 tablet, Rfl: 0   fluticasone  (FLONASE ) 50 MCG/ACT nasal spray, Place 2 sprays into both nostrils daily., Disp: 16 g, Rfl: 0    gabapentin  (NEURONTIN ) 300 MG capsule, Take 2 capsules (600 mg total) by mouth 3 (three) times daily. (Patient not taking: Reported on 07/07/2023), Disp: 180 capsule, Rfl: 11   HYDROcodone -acetaminophen  (NORCO/VICODIN) 5-325 MG tablet, Take 1 tablet by mouth every 4 (four) hours as needed., Disp: 10 tablet, Rfl: 0   hydrocortisone  cream 1 %, Apply 1 Application topically 4 (four) times daily., Disp: 30 g, Rfl: 0   hydrOXYzine  (ATARAX ) 50 MG tablet, Take 1 tablet (50 mg total) by mouth 3 (three) times daily as needed. (Patient not taking: Reported on 07/07/2023), Disp: 90 tablet, Rfl: 11   metroNIDAZOLE  (FLAGYL ) 500 MG tablet, Take 1 tablet (500 mg total) by mouth 2 (two) times daily., Disp: 14 tablet, Rfl: 0   nitrofurantoin , macrocrystal-monohydrate, (MACROBID ) 100 MG capsule, Take 1 capsule (100 mg total) by mouth 2 (two) times daily., Disp: 10 capsule, Rfl: 0   ondansetron  (ZOFRAN ) 4 MG tablet, Take 1 tablet (4 mg total) by mouth every 8 (eight) hours as needed for nausea or vomiting. (Patient not taking: Reported on 07/07/2023), Disp: 20 tablet, Rfl: 0   venlafaxine  XR (EFFEXOR -XR) 75 MG 24 hr capsule, Take 150 mg by mouth daily with breakfast. , Disp: , Rfl:    venlafaxine  XR (EFFEXOR -XR) 75 MG 24 hr capsule, Take 3 capsules (225 mg total) by mouth daily with breakfast., Disp: 90 capsule, Rfl: 1  "

## 2024-02-11 NOTE — Patient Instructions (Signed)
 " Catherine Mitchell, thank you for joining [name], NP for today's virtual visit.  While this provider is not your primary care provider (PCP), if your PCP is located in our provider database this encounter information will be shared with them immediately following your visit.   A [facility] MyChart account gives you access to today's visit and all your visits, tests, and labs performed at [facility]  click here if you don't have a [place] MyChart account or go to [email or web address]  Consent: (Patient) Catherine Mitchell provided verbal consent for this virtual visit at the beginning of the encounter.  Current Medications:  Current Outpatient Medications:    oseltamivir  (TAMIFLU ) 75 MG capsule, Take 1 capsule (75 mg total) by mouth 2 (two) times daily., Disp: 10 capsule, Rfl: 0   albuterol  (VENTOLIN  HFA) 108 (90 Base) MCG/ACT inhaler, Inhale 1-2 puffs into the lungs every 6 (six) hours as needed for wheezing or shortness of breath., Disp: 8 g, Rfl: 0   ARIPiprazole (ABILIFY) 10 MG tablet, Take 10 mg by mouth daily., Disp: , Rfl:    azelastine  (ASTELIN ) 0.1 % nasal spray, Place 1 spray into both nostrils 2 (two) times daily. Use in each nostril as directed, Disp: 30 mL, Rfl: 1   bacitracin  500 UNIT/GM ointment, Apply 1 Application topically 2 (two) times daily. (Patient not taking: Reported on 07/07/2023), Disp: 15 g, Rfl: 0   benzonatate  (TESSALON ) 200 MG capsule, Take 1 capsule (200 mg total) by mouth 3 (three) times daily as needed for cough. (Patient not taking: Reported on 07/07/2023), Disp: 20 capsule, Rfl: 0   carbamazepine  (TEGRETOL ) 200 MG tablet, Take 1 tablet (200 mg total) by mouth 2 (two) times daily. (Patient not taking: Reported on 07/07/2023), Disp: 60 tablet, Rfl: 1   cetirizine  (ZYRTEC ) 10 MG tablet, Take 1 tablet (10 mg total) by mouth daily., Disp: 30 tablet, Rfl: 0   clonazePAM  (KLONOPIN ) 0.5 MG tablet, Take 1 tablet (0.5 mg total) by mouth 3 (three)  times daily., Disp: 90 tablet, Rfl: 1   clonazePAM  (KLONOPIN ) 1 MG tablet, Take 1 mg by mouth 3 (three) times daily. (Patient not taking: Reported on 07/07/2023), Disp: , Rfl:    doxazosin  (CARDURA ) 4 MG tablet, Take 1 tablet (4 mg total) by mouth daily. (Patient not taking: Reported on 07/07/2023), Disp: 10 tablet, Rfl: 0   doxycycline  (VIBRAMYCIN ) 100 MG capsule, Take 1 capsule (100 mg total) by mouth 2 (two) times daily. (Patient not taking: Reported on 07/07/2023), Disp: 20 capsule, Rfl: 0   fexofenadine  (ALLEGRA ) 180 MG tablet, Take 1 tablet (180 mg total) by mouth daily., Disp: 60 tablet, Rfl: 0   fluconazole  (DIFLUCAN ) 150 MG tablet, Take 1 tab p.o. every 72 hours as needed for yeast infection, Disp: 2 tablet, Rfl: 0   fluticasone  (FLONASE ) 50 MCG/ACT nasal spray, Place 2 sprays into both nostrils daily., Disp: 16 g, Rfl: 0   gabapentin  (NEURONTIN ) 300 MG capsule, Take 2 capsules (600 mg total) by mouth 3 (three) times daily. (Patient not taking: Reported on 07/07/2023), Disp: 180 capsule, Rfl: 11   HYDROcodone -acetaminophen  (NORCO/VICODIN) 5-325 MG tablet, Take 1 tablet by mouth every 4 (four) hours as needed., Disp: 10 tablet, Rfl: 0   hydrocortisone  cream 1 %, Apply 1 Application topically 4 (four) times daily., Disp: 30 g, Rfl: 0   hydrOXYzine  (ATARAX ) 50 MG tablet, Take 1 tablet (50 mg total) by mouth 3 (three) times daily as needed. (Patient not taking: Reported on 07/07/2023),  Disp: 90 tablet, Rfl: 11   metroNIDAZOLE  (FLAGYL ) 500 MG tablet, Take 1 tablet (500 mg total) by mouth 2 (two) times daily., Disp: 14 tablet, Rfl: 0   nitrofurantoin , macrocrystal-monohydrate, (MACROBID ) 100 MG capsule, Take 1 capsule (100 mg total) by mouth 2 (two) times daily., Disp: 10 capsule, Rfl: 0   ondansetron  (ZOFRAN ) 4 MG tablet, Take 1 tablet (4 mg total) by mouth every 8 (eight) hours as needed for nausea or vomiting. (Patient not taking: Reported on 07/07/2023), Disp: 20 tablet, Rfl: 0   venlafaxine  XR (EFFEXOR -XR)  75 MG 24 hr capsule, Take 150 mg by mouth daily with breakfast. , Disp: , Rfl:    venlafaxine  XR (EFFEXOR -XR) 75 MG 24 hr capsule, Take 3 capsules (225 mg total) by mouth daily with breakfast., Disp: 90 capsule, Rfl: 1   Medications ordered in this encounter:  Meds ordered this encounter  Medications   albuterol  (VENTOLIN  HFA) 108 (90 Base) MCG/ACT inhaler    Sig: Inhale 1-2 puffs into the lungs every 6 (six) hours as needed for wheezing or shortness of breath.    Dispense:  8 g    Refill:  0   oseltamivir  (TAMIFLU ) 75 MG capsule    Sig: Take 1 capsule (75 mg total) by mouth 2 (two) times daily.    Dispense:  10 capsule    Refill:  0     *If you need refills on other medications prior to your next appointment, please contact your pharmacy*  Follow-Up: Call back or seek an in-person evaluation if the symptoms worsen or if the condition fails to improve as anticipated.  [facility] [phone]  Other Instructions  Continue using flonase   Start using nasal spray or saline irrigation again.   Continue dayquil or other over the counter cold/flu medicines to help manage your symptoms. I recommend mucinex .   Make sure to drink plenty of liquids to stay hydrated.    If you have been instructed to have an in-person evaluation today at a local Urgent Care facility, please use the link below. It will take you to a list of all of our available [facility], including address, phone number and hours of operation. Please do not delay care.  [facility]  If you or a family member do not have a primary care provider, use the link below to schedule a visit and establish care. When you choose a [facility] primary care physician or advanced practice provider, you gain a long-term partner in health. Find a Primary Care Provider  Learn more about [facility]'s in-office and virtual care options: [facility] - Get Care Now  "
